# Patient Record
Sex: Female | Born: 1960 | Race: White | Hispanic: No | Smoking: Former smoker
Health system: Southern US, Community
[De-identification: ages and names within clinical notes are randomized; demographics above are authoritative.]

## PROBLEM LIST (undated history)

## (undated) DIAGNOSIS — E119 Type 2 diabetes mellitus without complications: Secondary | ICD-10-CM

## (undated) DIAGNOSIS — I2510 Atherosclerotic heart disease of native coronary artery without angina pectoris: Secondary | ICD-10-CM

## (undated) DIAGNOSIS — R0600 Dyspnea, unspecified: Secondary | ICD-10-CM

## (undated) DIAGNOSIS — J45909 Unspecified asthma, uncomplicated: Secondary | ICD-10-CM

## (undated) DIAGNOSIS — K219 Gastro-esophageal reflux disease without esophagitis: Secondary | ICD-10-CM

## (undated) DIAGNOSIS — K760 Fatty (change of) liver, not elsewhere classified: Secondary | ICD-10-CM

## (undated) DIAGNOSIS — F32A Depression, unspecified: Secondary | ICD-10-CM

## (undated) DIAGNOSIS — M3500 Sicca syndrome, unspecified: Secondary | ICD-10-CM

## (undated) DIAGNOSIS — E785 Hyperlipidemia, unspecified: Secondary | ICD-10-CM

## (undated) DIAGNOSIS — I10 Essential (primary) hypertension: Secondary | ICD-10-CM

## (undated) DIAGNOSIS — E039 Hypothyroidism, unspecified: Secondary | ICD-10-CM

## (undated) DIAGNOSIS — J449 Chronic obstructive pulmonary disease, unspecified: Secondary | ICD-10-CM

## (undated) DIAGNOSIS — E079 Disorder of thyroid, unspecified: Secondary | ICD-10-CM

## (undated) DIAGNOSIS — F329 Major depressive disorder, single episode, unspecified: Secondary | ICD-10-CM

## (undated) DIAGNOSIS — G4730 Sleep apnea, unspecified: Secondary | ICD-10-CM

## (undated) DIAGNOSIS — M797 Fibromyalgia: Secondary | ICD-10-CM

## (undated) HISTORY — PX: CHOLECYSTECTOMY: SHX55

## (undated) HISTORY — PX: HYSTEROSCOPY: SHX211

## (undated) HISTORY — PX: CARPAL TUNNEL RELEASE: SHX101

## (undated) HISTORY — PX: BACK SURGERY: SHX140

## (undated) HISTORY — PX: FOOT SURGERY: SHX648

## (undated) HISTORY — PX: WISDOM TOOTH EXTRACTION: SHX21

## (undated) HISTORY — DX: Sleep apnea, unspecified: G47.30

## (undated) HISTORY — PX: TUBAL LIGATION: SHX77

## (undated) HISTORY — PX: ANKLE SURGERY: SHX546

## (undated) HISTORY — PX: RESECTION DISTAL CLAVICAL: SHX5053

---

## 1997-09-09 ENCOUNTER — Encounter: Admission: RE | Admit: 1997-09-09 | Discharge: 1997-12-08 | Payer: Self-pay | Admitting: *Deleted

## 1997-09-27 ENCOUNTER — Ambulatory Visit (HOSPITAL_COMMUNITY): Admission: RE | Admit: 1997-09-27 | Discharge: 1997-09-27 | Payer: Self-pay | Admitting: Family Medicine

## 1997-12-23 ENCOUNTER — Other Ambulatory Visit: Admission: RE | Admit: 1997-12-23 | Discharge: 1997-12-23 | Payer: Self-pay | Admitting: Family Medicine

## 1997-12-30 ENCOUNTER — Ambulatory Visit (HOSPITAL_COMMUNITY): Admission: RE | Admit: 1997-12-30 | Discharge: 1997-12-30 | Payer: Self-pay | Admitting: Family Medicine

## 1998-02-03 ENCOUNTER — Encounter: Payer: Self-pay | Admitting: Family Medicine

## 1998-02-03 ENCOUNTER — Ambulatory Visit (HOSPITAL_COMMUNITY): Admission: RE | Admit: 1998-02-03 | Discharge: 1998-02-03 | Payer: Self-pay | Admitting: Family Medicine

## 1998-07-12 ENCOUNTER — Emergency Department (HOSPITAL_COMMUNITY): Admission: EM | Admit: 1998-07-12 | Discharge: 1998-07-12 | Payer: Self-pay | Admitting: Emergency Medicine

## 1998-07-12 ENCOUNTER — Encounter: Payer: Self-pay | Admitting: Emergency Medicine

## 1998-10-05 ENCOUNTER — Ambulatory Visit (HOSPITAL_BASED_OUTPATIENT_CLINIC_OR_DEPARTMENT_OTHER): Admission: RE | Admit: 1998-10-05 | Discharge: 1998-10-05 | Payer: Self-pay | Admitting: Orthopedic Surgery

## 1998-11-24 ENCOUNTER — Encounter: Payer: Self-pay | Admitting: Cardiology

## 1998-11-24 ENCOUNTER — Inpatient Hospital Stay (HOSPITAL_COMMUNITY): Admission: EM | Admit: 1998-11-24 | Discharge: 1998-11-25 | Payer: Self-pay | Admitting: Emergency Medicine

## 1998-11-25 ENCOUNTER — Encounter: Payer: Self-pay | Admitting: Cardiology

## 1998-12-28 ENCOUNTER — Ambulatory Visit (HOSPITAL_COMMUNITY): Admission: RE | Admit: 1998-12-28 | Discharge: 1998-12-28 | Payer: Self-pay | Admitting: Family Medicine

## 1998-12-28 ENCOUNTER — Encounter: Payer: Self-pay | Admitting: Family Medicine

## 1999-01-16 ENCOUNTER — Emergency Department (HOSPITAL_COMMUNITY): Admission: EM | Admit: 1999-01-16 | Discharge: 1999-01-16 | Payer: Self-pay | Admitting: Internal Medicine

## 1999-03-14 ENCOUNTER — Emergency Department (HOSPITAL_COMMUNITY): Admission: EM | Admit: 1999-03-14 | Discharge: 1999-03-14 | Payer: Self-pay | Admitting: Emergency Medicine

## 1999-03-17 ENCOUNTER — Ambulatory Visit (HOSPITAL_COMMUNITY): Admission: RE | Admit: 1999-03-17 | Discharge: 1999-03-17 | Payer: Self-pay | Admitting: Gastroenterology

## 1999-07-10 ENCOUNTER — Encounter: Payer: Self-pay | Admitting: Emergency Medicine

## 1999-07-10 ENCOUNTER — Emergency Department (HOSPITAL_COMMUNITY): Admission: EM | Admit: 1999-07-10 | Discharge: 1999-07-10 | Payer: Self-pay | Admitting: Emergency Medicine

## 2000-01-19 ENCOUNTER — Emergency Department (HOSPITAL_COMMUNITY): Admission: EM | Admit: 2000-01-19 | Discharge: 2000-01-19 | Payer: Self-pay | Admitting: Emergency Medicine

## 2000-01-19 ENCOUNTER — Encounter: Payer: Self-pay | Admitting: Emergency Medicine

## 2000-02-09 ENCOUNTER — Encounter: Payer: Self-pay | Admitting: Internal Medicine

## 2000-02-13 ENCOUNTER — Encounter: Payer: Self-pay | Admitting: Internal Medicine

## 2000-02-13 ENCOUNTER — Ambulatory Visit (HOSPITAL_COMMUNITY): Admission: RE | Admit: 2000-02-13 | Discharge: 2000-02-13 | Payer: Self-pay | Admitting: Internal Medicine

## 2000-02-23 ENCOUNTER — Emergency Department (HOSPITAL_COMMUNITY): Admission: EM | Admit: 2000-02-23 | Discharge: 2000-02-24 | Payer: Self-pay | Admitting: Emergency Medicine

## 2000-04-07 ENCOUNTER — Emergency Department (HOSPITAL_COMMUNITY): Admission: EM | Admit: 2000-04-07 | Discharge: 2000-04-07 | Payer: Self-pay | Admitting: Emergency Medicine

## 2000-04-07 ENCOUNTER — Encounter: Payer: Self-pay | Admitting: Family Medicine

## 2000-04-23 ENCOUNTER — Encounter: Payer: Self-pay | Admitting: General Surgery

## 2000-04-29 ENCOUNTER — Encounter (INDEPENDENT_AMBULATORY_CARE_PROVIDER_SITE_OTHER): Payer: Self-pay

## 2000-04-29 ENCOUNTER — Observation Stay (HOSPITAL_COMMUNITY): Admission: RE | Admit: 2000-04-29 | Discharge: 2000-04-30 | Payer: Self-pay | Admitting: General Surgery

## 2000-05-06 ENCOUNTER — Other Ambulatory Visit: Admission: RE | Admit: 2000-05-06 | Discharge: 2000-05-06 | Payer: Self-pay | Admitting: Gynecology

## 2000-05-09 ENCOUNTER — Encounter: Admission: RE | Admit: 2000-05-09 | Discharge: 2000-05-09 | Payer: Self-pay | Admitting: Gynecology

## 2000-05-09 ENCOUNTER — Encounter: Payer: Self-pay | Admitting: Gynecology

## 2000-07-30 ENCOUNTER — Emergency Department (HOSPITAL_COMMUNITY): Admission: EM | Admit: 2000-07-30 | Discharge: 2000-07-30 | Payer: Self-pay | Admitting: *Deleted

## 2000-08-17 ENCOUNTER — Ambulatory Visit (HOSPITAL_COMMUNITY): Admission: RE | Admit: 2000-08-17 | Discharge: 2000-08-17 | Payer: Self-pay | Admitting: Specialist

## 2000-08-17 ENCOUNTER — Encounter: Payer: Self-pay | Admitting: Specialist

## 2000-09-24 ENCOUNTER — Encounter: Payer: Self-pay | Admitting: Emergency Medicine

## 2000-09-24 ENCOUNTER — Emergency Department (HOSPITAL_COMMUNITY): Admission: EM | Admit: 2000-09-24 | Discharge: 2000-09-24 | Payer: Self-pay | Admitting: Emergency Medicine

## 2001-01-27 ENCOUNTER — Emergency Department (HOSPITAL_COMMUNITY): Admission: EM | Admit: 2001-01-27 | Discharge: 2001-01-28 | Payer: Self-pay | Admitting: Emergency Medicine

## 2001-03-28 ENCOUNTER — Emergency Department (HOSPITAL_COMMUNITY): Admission: EM | Admit: 2001-03-28 | Discharge: 2001-03-28 | Payer: Self-pay | Admitting: Emergency Medicine

## 2001-06-17 ENCOUNTER — Encounter: Payer: Self-pay | Admitting: Gynecology

## 2001-06-17 ENCOUNTER — Encounter: Admission: RE | Admit: 2001-06-17 | Discharge: 2001-06-17 | Payer: Self-pay | Admitting: Gynecology

## 2001-06-17 ENCOUNTER — Other Ambulatory Visit: Admission: RE | Admit: 2001-06-17 | Discharge: 2001-06-17 | Payer: Self-pay | Admitting: Gynecology

## 2001-07-05 ENCOUNTER — Emergency Department (HOSPITAL_COMMUNITY): Admission: EM | Admit: 2001-07-05 | Discharge: 2001-07-05 | Payer: Self-pay | Admitting: Emergency Medicine

## 2001-07-05 ENCOUNTER — Encounter: Payer: Self-pay | Admitting: Emergency Medicine

## 2001-09-11 ENCOUNTER — Emergency Department (HOSPITAL_COMMUNITY): Admission: EM | Admit: 2001-09-11 | Discharge: 2001-09-11 | Payer: Self-pay | Admitting: Emergency Medicine

## 2001-09-12 ENCOUNTER — Encounter: Payer: Self-pay | Admitting: Emergency Medicine

## 2001-10-23 ENCOUNTER — Encounter (INDEPENDENT_AMBULATORY_CARE_PROVIDER_SITE_OTHER): Payer: Self-pay | Admitting: Specialist

## 2001-10-23 ENCOUNTER — Ambulatory Visit (HOSPITAL_BASED_OUTPATIENT_CLINIC_OR_DEPARTMENT_OTHER): Admission: RE | Admit: 2001-10-23 | Discharge: 2001-10-23 | Payer: Self-pay | Admitting: Gynecology

## 2002-04-13 ENCOUNTER — Emergency Department (HOSPITAL_COMMUNITY): Admission: EM | Admit: 2002-04-13 | Discharge: 2002-04-13 | Payer: Self-pay | Admitting: Emergency Medicine

## 2002-04-21 ENCOUNTER — Emergency Department (HOSPITAL_COMMUNITY): Admission: EM | Admit: 2002-04-21 | Discharge: 2002-04-21 | Payer: Self-pay | Admitting: Emergency Medicine

## 2002-06-07 ENCOUNTER — Emergency Department (HOSPITAL_COMMUNITY): Admission: EM | Admit: 2002-06-07 | Discharge: 2002-06-07 | Payer: Self-pay | Admitting: Emergency Medicine

## 2002-06-07 ENCOUNTER — Encounter: Payer: Self-pay | Admitting: Emergency Medicine

## 2002-07-19 ENCOUNTER — Emergency Department (HOSPITAL_COMMUNITY): Admission: EM | Admit: 2002-07-19 | Discharge: 2002-07-19 | Payer: Self-pay | Admitting: Emergency Medicine

## 2002-08-13 ENCOUNTER — Emergency Department (HOSPITAL_COMMUNITY): Admission: EM | Admit: 2002-08-13 | Discharge: 2002-08-13 | Payer: Self-pay | Admitting: Emergency Medicine

## 2002-08-13 ENCOUNTER — Encounter: Payer: Self-pay | Admitting: Emergency Medicine

## 2003-02-25 ENCOUNTER — Ambulatory Visit (HOSPITAL_COMMUNITY): Admission: RE | Admit: 2003-02-25 | Discharge: 2003-02-25 | Payer: Self-pay | Admitting: Internal Medicine

## 2003-02-25 ENCOUNTER — Encounter: Payer: Self-pay | Admitting: Internal Medicine

## 2003-03-08 ENCOUNTER — Emergency Department (HOSPITAL_COMMUNITY): Admission: EM | Admit: 2003-03-08 | Discharge: 2003-03-08 | Payer: Self-pay | Admitting: Emergency Medicine

## 2003-08-18 ENCOUNTER — Emergency Department (HOSPITAL_COMMUNITY): Admission: EM | Admit: 2003-08-18 | Discharge: 2003-08-18 | Payer: Self-pay | Admitting: Emergency Medicine

## 2003-08-19 ENCOUNTER — Ambulatory Visit (HOSPITAL_COMMUNITY): Admission: RE | Admit: 2003-08-19 | Discharge: 2003-08-19 | Payer: Self-pay | Admitting: Obstetrics & Gynecology

## 2003-10-06 ENCOUNTER — Ambulatory Visit (HOSPITAL_COMMUNITY): Admission: RE | Admit: 2003-10-06 | Discharge: 2003-10-06 | Payer: Self-pay | Admitting: Obstetrics & Gynecology

## 2003-11-25 ENCOUNTER — Emergency Department (HOSPITAL_COMMUNITY): Admission: EM | Admit: 2003-11-25 | Discharge: 2003-11-25 | Payer: Self-pay | Admitting: Emergency Medicine

## 2004-01-29 ENCOUNTER — Emergency Department (HOSPITAL_COMMUNITY): Admission: EM | Admit: 2004-01-29 | Discharge: 2004-01-29 | Payer: Self-pay | Admitting: Emergency Medicine

## 2004-06-07 ENCOUNTER — Ambulatory Visit (HOSPITAL_COMMUNITY): Admission: RE | Admit: 2004-06-07 | Discharge: 2004-06-07 | Payer: Self-pay | Admitting: Obstetrics & Gynecology

## 2004-06-29 ENCOUNTER — Emergency Department (HOSPITAL_COMMUNITY): Admission: EM | Admit: 2004-06-29 | Discharge: 2004-06-29 | Payer: Self-pay | Admitting: Emergency Medicine

## 2004-08-01 ENCOUNTER — Ambulatory Visit (HOSPITAL_COMMUNITY): Admission: RE | Admit: 2004-08-01 | Discharge: 2004-08-01 | Payer: Self-pay | Admitting: Orthopedic Surgery

## 2004-08-01 ENCOUNTER — Ambulatory Visit (HOSPITAL_BASED_OUTPATIENT_CLINIC_OR_DEPARTMENT_OTHER): Admission: RE | Admit: 2004-08-01 | Discharge: 2004-08-01 | Payer: Self-pay | Admitting: Orthopedic Surgery

## 2004-10-22 ENCOUNTER — Emergency Department (HOSPITAL_COMMUNITY): Admission: EM | Admit: 2004-10-22 | Discharge: 2004-10-22 | Payer: Self-pay | Admitting: Emergency Medicine

## 2004-12-14 ENCOUNTER — Emergency Department (HOSPITAL_COMMUNITY): Admission: EM | Admit: 2004-12-14 | Discharge: 2004-12-14 | Payer: Self-pay | Admitting: Emergency Medicine

## 2005-01-29 ENCOUNTER — Emergency Department (HOSPITAL_COMMUNITY): Admission: EM | Admit: 2005-01-29 | Discharge: 2005-01-30 | Payer: Self-pay | Admitting: Emergency Medicine

## 2005-02-05 ENCOUNTER — Emergency Department (HOSPITAL_COMMUNITY): Admission: EM | Admit: 2005-02-05 | Discharge: 2005-02-05 | Payer: Self-pay | Admitting: Emergency Medicine

## 2005-03-20 ENCOUNTER — Ambulatory Visit (HOSPITAL_COMMUNITY): Admission: RE | Admit: 2005-03-20 | Discharge: 2005-03-20 | Payer: Self-pay | Admitting: Obstetrics & Gynecology

## 2005-06-27 ENCOUNTER — Emergency Department (HOSPITAL_COMMUNITY): Admission: EM | Admit: 2005-06-27 | Discharge: 2005-06-27 | Payer: Self-pay | Admitting: Emergency Medicine

## 2005-07-04 ENCOUNTER — Emergency Department: Payer: Self-pay | Admitting: Emergency Medicine

## 2005-07-20 ENCOUNTER — Emergency Department (HOSPITAL_COMMUNITY): Admission: EM | Admit: 2005-07-20 | Discharge: 2005-07-20 | Payer: Self-pay | Admitting: Emergency Medicine

## 2005-10-22 ENCOUNTER — Emergency Department (HOSPITAL_COMMUNITY): Admission: EM | Admit: 2005-10-22 | Discharge: 2005-10-22 | Payer: Self-pay | Admitting: Emergency Medicine

## 2006-03-26 ENCOUNTER — Ambulatory Visit (HOSPITAL_COMMUNITY): Admission: RE | Admit: 2006-03-26 | Discharge: 2006-03-26 | Payer: Self-pay | Admitting: Obstetrics & Gynecology

## 2006-07-12 ENCOUNTER — Encounter: Payer: Self-pay | Admitting: Internal Medicine

## 2006-08-22 ENCOUNTER — Ambulatory Visit: Payer: Self-pay | Admitting: Pulmonary Disease

## 2006-09-01 ENCOUNTER — Ambulatory Visit (HOSPITAL_BASED_OUTPATIENT_CLINIC_OR_DEPARTMENT_OTHER): Admission: RE | Admit: 2006-09-01 | Discharge: 2006-09-01 | Payer: Self-pay | Admitting: Pulmonary Disease

## 2006-09-01 ENCOUNTER — Ambulatory Visit: Payer: Self-pay | Admitting: Pulmonary Disease

## 2006-09-17 ENCOUNTER — Ambulatory Visit: Payer: Self-pay | Admitting: Pulmonary Disease

## 2006-11-27 ENCOUNTER — Ambulatory Visit: Payer: Self-pay | Admitting: Pulmonary Disease

## 2007-01-03 ENCOUNTER — Ambulatory Visit: Payer: Self-pay | Admitting: Internal Medicine

## 2007-01-03 LAB — CONVERTED CEMR LAB
ALT: 20 units/L (ref 0–35)
Calcium: 8.7 mg/dL (ref 8.4–10.5)
Direct LDL: 153.4 mg/dL
Eosinophils Absolute: 0.2 10*3/uL (ref 0.0–0.6)
Eosinophils Relative: 2.2 % (ref 0.0–5.0)
GFR calc non Af Amer: 114 mL/min
Glucose, Bld: 99 mg/dL (ref 70–99)
Hemoglobin: 12.5 g/dL (ref 12.0–15.0)
Hgb A1c MFr Bld: 6.2 % — ABNORMAL HIGH (ref 4.6–6.0)
Ketones, ur: NEGATIVE mg/dL
Leukocytes, UA: NEGATIVE
Lymphocytes Relative: 32.5 % (ref 12.0–46.0)
MCV: 86.6 fL (ref 78.0–100.0)
Monocytes Relative: 9 % (ref 3.0–11.0)
Neutro Abs: 4.6 10*3/uL (ref 1.4–7.7)
Specific Gravity, Urine: 1.015 (ref 1.000–1.03)
Total Bilirubin: 0.6 mg/dL (ref 0.3–1.2)
Total Protein, Urine: NEGATIVE mg/dL
Total Protein: 6.4 g/dL (ref 6.0–8.3)
Urobilinogen, UA: 0.2 (ref 0.0–1.0)

## 2007-01-05 ENCOUNTER — Encounter: Payer: Self-pay | Admitting: Internal Medicine

## 2007-01-05 DIAGNOSIS — F329 Major depressive disorder, single episode, unspecified: Secondary | ICD-10-CM

## 2007-01-05 DIAGNOSIS — E785 Hyperlipidemia, unspecified: Secondary | ICD-10-CM | POA: Insufficient documentation

## 2007-01-05 DIAGNOSIS — F32A Depression, unspecified: Secondary | ICD-10-CM | POA: Diagnosis present

## 2007-01-05 DIAGNOSIS — E782 Mixed hyperlipidemia: Secondary | ICD-10-CM | POA: Insufficient documentation

## 2007-01-05 DIAGNOSIS — Q742 Other congenital malformations of lower limb(s), including pelvic girdle: Secondary | ICD-10-CM

## 2007-01-05 DIAGNOSIS — I10 Essential (primary) hypertension: Secondary | ICD-10-CM

## 2007-01-05 DIAGNOSIS — F3289 Other specified depressive episodes: Secondary | ICD-10-CM

## 2007-01-05 DIAGNOSIS — K219 Gastro-esophageal reflux disease without esophagitis: Secondary | ICD-10-CM | POA: Insufficient documentation

## 2007-01-05 DIAGNOSIS — J45909 Unspecified asthma, uncomplicated: Secondary | ICD-10-CM | POA: Insufficient documentation

## 2007-01-05 DIAGNOSIS — E039 Hypothyroidism, unspecified: Secondary | ICD-10-CM | POA: Insufficient documentation

## 2007-01-05 DIAGNOSIS — G4733 Obstructive sleep apnea (adult) (pediatric): Secondary | ICD-10-CM

## 2007-01-05 HISTORY — DX: Mixed hyperlipidemia: E78.2

## 2007-01-05 HISTORY — DX: Hyperlipidemia, unspecified: E78.5

## 2007-01-05 HISTORY — DX: Other congenital malformations of lower limb(s), including pelvic girdle: Q74.2

## 2007-01-05 HISTORY — DX: Other specified depressive episodes: F32.89

## 2007-01-05 HISTORY — DX: Major depressive disorder, single episode, unspecified: F32.9

## 2007-01-05 HISTORY — DX: Gastro-esophageal reflux disease without esophagitis: K21.9

## 2007-01-05 HISTORY — DX: Essential (primary) hypertension: I10

## 2007-01-05 HISTORY — DX: Obstructive sleep apnea (adult) (pediatric): G47.33

## 2007-02-03 ENCOUNTER — Ambulatory Visit: Payer: Self-pay | Admitting: Internal Medicine

## 2007-03-12 ENCOUNTER — Ambulatory Visit (HOSPITAL_COMMUNITY): Admission: RE | Admit: 2007-03-12 | Discharge: 2007-03-12 | Payer: Self-pay | Admitting: Obstetrics & Gynecology

## 2007-03-24 ENCOUNTER — Ambulatory Visit: Payer: Self-pay | Admitting: Internal Medicine

## 2007-03-26 ENCOUNTER — Encounter: Payer: Self-pay | Admitting: Internal Medicine

## 2007-03-26 ENCOUNTER — Telehealth: Payer: Self-pay | Admitting: Internal Medicine

## 2007-03-26 ENCOUNTER — Ambulatory Visit: Payer: Self-pay | Admitting: Internal Medicine

## 2007-03-26 DIAGNOSIS — E119 Type 2 diabetes mellitus without complications: Secondary | ICD-10-CM

## 2007-03-26 DIAGNOSIS — J309 Allergic rhinitis, unspecified: Secondary | ICD-10-CM

## 2007-03-26 DIAGNOSIS — G4726 Circadian rhythm sleep disorder, shift work type: Secondary | ICD-10-CM

## 2007-03-26 DIAGNOSIS — K279 Peptic ulcer, site unspecified, unspecified as acute or chronic, without hemorrhage or perforation: Secondary | ICD-10-CM

## 2007-03-26 DIAGNOSIS — J45901 Unspecified asthma with (acute) exacerbation: Secondary | ICD-10-CM | POA: Insufficient documentation

## 2007-03-26 DIAGNOSIS — E088 Diabetes mellitus due to underlying condition with unspecified complications: Secondary | ICD-10-CM

## 2007-03-26 DIAGNOSIS — Z8719 Personal history of other diseases of the digestive system: Secondary | ICD-10-CM

## 2007-03-26 DIAGNOSIS — Z8669 Personal history of other diseases of the nervous system and sense organs: Secondary | ICD-10-CM

## 2007-03-26 HISTORY — DX: Personal history of other diseases of the nervous system and sense organs: Z86.69

## 2007-03-26 HISTORY — DX: Circadian rhythm sleep disorder, shift work type: G47.26

## 2007-03-26 HISTORY — DX: Diabetes mellitus due to underlying condition with unspecified complications: E08.8

## 2007-03-26 HISTORY — DX: Unspecified asthma with (acute) exacerbation: J45.901

## 2007-03-26 HISTORY — DX: Allergic rhinitis, unspecified: J30.9

## 2007-03-26 HISTORY — DX: Peptic ulcer, site unspecified, unspecified as acute or chronic, without hemorrhage or perforation: K27.9

## 2007-03-26 HISTORY — DX: Personal history of other diseases of the digestive system: Z87.19

## 2007-05-01 ENCOUNTER — Observation Stay (HOSPITAL_COMMUNITY): Admission: EM | Admit: 2007-05-01 | Discharge: 2007-05-02 | Payer: Self-pay | Admitting: Family Medicine

## 2007-07-21 ENCOUNTER — Emergency Department (HOSPITAL_COMMUNITY): Admission: EM | Admit: 2007-07-21 | Discharge: 2007-07-21 | Payer: Self-pay | Admitting: Emergency Medicine

## 2007-08-04 ENCOUNTER — Other Ambulatory Visit: Admission: RE | Admit: 2007-08-04 | Discharge: 2007-08-04 | Payer: Self-pay | Admitting: Obstetrics & Gynecology

## 2007-09-08 ENCOUNTER — Emergency Department (HOSPITAL_COMMUNITY): Admission: EM | Admit: 2007-09-08 | Discharge: 2007-09-08 | Payer: Self-pay | Admitting: Emergency Medicine

## 2007-11-22 ENCOUNTER — Emergency Department (HOSPITAL_COMMUNITY): Admission: EM | Admit: 2007-11-22 | Discharge: 2007-11-22 | Payer: Self-pay | Admitting: Emergency Medicine

## 2008-03-04 ENCOUNTER — Ambulatory Visit: Payer: Self-pay | Admitting: Internal Medicine

## 2008-03-04 DIAGNOSIS — B370 Candidal stomatitis: Secondary | ICD-10-CM | POA: Insufficient documentation

## 2008-03-04 LAB — CONVERTED CEMR LAB
Alkaline Phosphatase: 99 units/L (ref 39–117)
BUN: 8 mg/dL (ref 6–23)
CO2: 29 meq/L (ref 19–32)
Chloride: 102 meq/L (ref 96–112)
Cholesterol: 211 mg/dL (ref 0–200)
Creatinine, Ser: 0.7 mg/dL (ref 0.4–1.2)
Direct LDL: 178.1 mg/dL
Eosinophils Relative: 1.4 % (ref 0.0–5.0)
GFR calc non Af Amer: 95 mL/min
HCT: 41.2 % (ref 36.0–46.0)
Hgb A1c MFr Bld: 6.2 % — ABNORMAL HIGH (ref 4.6–6.0)
Lymphocytes Relative: 24.3 % (ref 12.0–46.0)
Monocytes Relative: 2.5 % — ABNORMAL LOW (ref 3.0–12.0)
Neutrophils Relative %: 71.1 % (ref 43.0–77.0)
Nitrite: NEGATIVE
Platelets: 343 10*3/uL (ref 150–400)
Potassium: 3.4 meq/L — ABNORMAL LOW (ref 3.5–5.1)
Total Bilirubin: 1.2 mg/dL (ref 0.3–1.2)
Total Protein, Urine: NEGATIVE mg/dL
VLDL: 19 mg/dL (ref 0–40)
WBC: 10 10*3/uL (ref 4.5–10.5)
pH: 7.5 (ref 5.0–8.0)

## 2008-03-06 LAB — CONVERTED CEMR LAB: Vit D, 1,25-Dihydroxy: 36 (ref 30–89)

## 2008-03-12 ENCOUNTER — Ambulatory Visit (HOSPITAL_COMMUNITY): Admission: RE | Admit: 2008-03-12 | Discharge: 2008-03-12 | Payer: Self-pay | Admitting: Obstetrics & Gynecology

## 2008-03-17 ENCOUNTER — Telehealth (INDEPENDENT_AMBULATORY_CARE_PROVIDER_SITE_OTHER): Payer: Self-pay | Admitting: *Deleted

## 2008-03-17 DIAGNOSIS — R197 Diarrhea, unspecified: Secondary | ICD-10-CM | POA: Insufficient documentation

## 2008-03-18 ENCOUNTER — Telehealth (INDEPENDENT_AMBULATORY_CARE_PROVIDER_SITE_OTHER): Payer: Self-pay | Admitting: *Deleted

## 2008-04-01 ENCOUNTER — Encounter: Payer: Self-pay | Admitting: Internal Medicine

## 2008-04-02 ENCOUNTER — Encounter: Payer: Self-pay | Admitting: Internal Medicine

## 2008-04-22 DIAGNOSIS — J42 Unspecified chronic bronchitis: Secondary | ICD-10-CM

## 2008-04-22 DIAGNOSIS — Z862 Personal history of diseases of the blood and blood-forming organs and certain disorders involving the immune mechanism: Secondary | ICD-10-CM

## 2008-04-22 DIAGNOSIS — Z8639 Personal history of other endocrine, nutritional and metabolic disease: Secondary | ICD-10-CM

## 2008-04-22 DIAGNOSIS — G43909 Migraine, unspecified, not intractable, without status migrainosus: Secondary | ICD-10-CM | POA: Insufficient documentation

## 2008-04-22 HISTORY — DX: Migraine, unspecified, not intractable, without status migrainosus: G43.909

## 2008-04-22 HISTORY — DX: Personal history of diseases of the blood and blood-forming organs and certain disorders involving the immune mechanism: Z86.2

## 2008-04-22 HISTORY — DX: Personal history of diseases of the blood and blood-forming organs and certain disorders involving the immune mechanism: Z86.39

## 2008-04-22 HISTORY — DX: Unspecified chronic bronchitis: J42

## 2008-04-27 ENCOUNTER — Ambulatory Visit: Payer: Self-pay | Admitting: Internal Medicine

## 2008-04-27 DIAGNOSIS — R1319 Other dysphagia: Secondary | ICD-10-CM

## 2008-04-27 DIAGNOSIS — J209 Acute bronchitis, unspecified: Secondary | ICD-10-CM | POA: Insufficient documentation

## 2008-04-27 HISTORY — DX: Other dysphagia: R13.19

## 2008-04-27 HISTORY — DX: Acute bronchitis, unspecified: J20.9

## 2008-04-27 LAB — CONVERTED CEMR LAB: Tissue Transglutaminase Ab, IgA: 0.4 units (ref <7)

## 2008-05-10 ENCOUNTER — Encounter: Payer: Self-pay | Admitting: Internal Medicine

## 2008-05-10 ENCOUNTER — Ambulatory Visit: Payer: Self-pay | Admitting: Internal Medicine

## 2008-05-12 ENCOUNTER — Encounter: Payer: Self-pay | Admitting: Internal Medicine

## 2008-06-11 HISTORY — PX: ANKLE SURGERY: SHX546

## 2008-07-06 ENCOUNTER — Ambulatory Visit: Payer: Self-pay | Admitting: Internal Medicine

## 2008-08-06 ENCOUNTER — Other Ambulatory Visit: Admission: RE | Admit: 2008-08-06 | Discharge: 2008-08-06 | Payer: Self-pay | Admitting: Obstetrics & Gynecology

## 2008-08-13 ENCOUNTER — Inpatient Hospital Stay (HOSPITAL_COMMUNITY): Admission: EM | Admit: 2008-08-13 | Discharge: 2008-08-16 | Payer: Self-pay | Admitting: Emergency Medicine

## 2008-08-31 ENCOUNTER — Ambulatory Visit: Payer: Self-pay | Admitting: Internal Medicine

## 2008-09-01 LAB — CONVERTED CEMR LAB
Chloride: 105 meq/L (ref 96–112)
Cholesterol: 157 mg/dL (ref 0–200)
Creatinine, Ser: 1.1 mg/dL (ref 0.4–1.2)
HDL: 35.1 mg/dL — ABNORMAL LOW (ref >39.00)
LDL Cholesterol: 96 mg/dL (ref 0–99)
Potassium: 3.4 meq/L — ABNORMAL LOW (ref 3.5–5.1)
Sodium: 143 meq/L (ref 135–145)
Triglycerides: 131 mg/dL (ref 0.0–149.0)

## 2008-12-17 ENCOUNTER — Emergency Department (HOSPITAL_COMMUNITY): Admission: EM | Admit: 2008-12-17 | Discharge: 2008-12-17 | Payer: Self-pay | Admitting: Emergency Medicine

## 2009-02-09 ENCOUNTER — Telehealth: Payer: Self-pay | Admitting: Internal Medicine

## 2009-02-24 ENCOUNTER — Ambulatory Visit: Payer: Self-pay | Admitting: Internal Medicine

## 2009-02-24 DIAGNOSIS — R519 Headache, unspecified: Secondary | ICD-10-CM | POA: Insufficient documentation

## 2009-02-24 DIAGNOSIS — R51 Headache: Secondary | ICD-10-CM

## 2009-02-24 DIAGNOSIS — J019 Acute sinusitis, unspecified: Secondary | ICD-10-CM | POA: Insufficient documentation

## 2009-02-24 HISTORY — DX: Headache: R51

## 2009-02-24 HISTORY — DX: Headache, unspecified: R51.9

## 2009-02-24 HISTORY — DX: Acute sinusitis, unspecified: J01.90

## 2009-03-14 ENCOUNTER — Ambulatory Visit: Payer: Self-pay | Admitting: Internal Medicine

## 2009-03-14 LAB — CONVERTED CEMR LAB
BUN: 18 mg/dL (ref 6–23)
Basophils Absolute: 0.3 10*3/uL — ABNORMAL HIGH (ref 0.0–0.1)
Cholesterol: 220 mg/dL — ABNORMAL HIGH (ref 0–200)
Direct LDL: 179.5 mg/dL
GFR calc non Af Amer: 94.12 mL/min (ref >60)
Glucose, Bld: 82 mg/dL (ref 70–99)
HCT: 38.2 % (ref 36.0–46.0)
HDL: 38.5 mg/dL — ABNORMAL LOW (ref >39.00)
Lymphs Abs: 2.6 10*3/uL (ref 0.7–4.0)
MCV: 87.6 fL (ref 78.0–100.0)
Microalb, Ur: 0.7 mg/dL (ref 0.0–1.9)
Monocytes Absolute: 0.6 10*3/uL (ref 0.1–1.0)
Monocytes Relative: 6.1 % (ref 3.0–12.0)
Platelets: 283 10*3/uL (ref 150.0–400.0)
Potassium: 3.5 meq/L (ref 3.5–5.1)
RDW: 13.7 % (ref 11.5–14.6)
Total Bilirubin: 0.7 mg/dL (ref 0.3–1.2)
Total CHOL/HDL Ratio: 6
Triglycerides: 128 mg/dL (ref 0.0–149.0)
Urine Glucose: NEGATIVE mg/dL
Urobilinogen, UA: 0.2 (ref 0.0–1.0)
VLDL: 25.6 mg/dL (ref 0.0–40.0)

## 2009-03-15 LAB — CONVERTED CEMR LAB: Vit D, 25-Hydroxy: 26 ng/mL — ABNORMAL LOW (ref 30–89)

## 2009-03-21 ENCOUNTER — Telehealth: Payer: Self-pay | Admitting: Internal Medicine

## 2009-03-21 ENCOUNTER — Ambulatory Visit (HOSPITAL_COMMUNITY): Admission: RE | Admit: 2009-03-21 | Discharge: 2009-03-21 | Payer: Self-pay | Admitting: Obstetrics & Gynecology

## 2009-05-23 ENCOUNTER — Ambulatory Visit: Payer: Self-pay | Admitting: Internal Medicine

## 2009-05-23 DIAGNOSIS — J029 Acute pharyngitis, unspecified: Secondary | ICD-10-CM | POA: Insufficient documentation

## 2009-05-23 HISTORY — DX: Acute pharyngitis, unspecified: J02.9

## 2010-05-29 ENCOUNTER — Emergency Department (HOSPITAL_COMMUNITY)
Admission: EM | Admit: 2010-05-29 | Discharge: 2010-05-29 | Payer: Self-pay | Source: Home / Self Care | Admitting: Emergency Medicine

## 2010-06-24 ENCOUNTER — Emergency Department (HOSPITAL_COMMUNITY)
Admission: EM | Admit: 2010-06-24 | Discharge: 2010-06-25 | Payer: Self-pay | Source: Home / Self Care | Admitting: Emergency Medicine

## 2010-08-20 ENCOUNTER — Emergency Department (HOSPITAL_COMMUNITY)
Admission: EM | Admit: 2010-08-20 | Discharge: 2010-08-20 | Disposition: A | Discharge status: Home / Self Care | Payer: Self-pay | Attending: Emergency Medicine | Admitting: Emergency Medicine

## 2010-08-20 DIAGNOSIS — E039 Hypothyroidism, unspecified: Secondary | ICD-10-CM | POA: Insufficient documentation

## 2010-08-20 DIAGNOSIS — J45909 Unspecified asthma, uncomplicated: Secondary | ICD-10-CM | POA: Insufficient documentation

## 2010-08-20 DIAGNOSIS — Z79899 Other long term (current) drug therapy: Secondary | ICD-10-CM | POA: Insufficient documentation

## 2010-08-20 DIAGNOSIS — K219 Gastro-esophageal reflux disease without esophagitis: Secondary | ICD-10-CM | POA: Insufficient documentation

## 2010-08-20 DIAGNOSIS — Z9889 Other specified postprocedural states: Secondary | ICD-10-CM | POA: Insufficient documentation

## 2010-08-20 DIAGNOSIS — M25569 Pain in unspecified knee: Secondary | ICD-10-CM | POA: Insufficient documentation

## 2010-08-20 DIAGNOSIS — F3289 Other specified depressive episodes: Secondary | ICD-10-CM | POA: Insufficient documentation

## 2010-08-20 DIAGNOSIS — I10 Essential (primary) hypertension: Secondary | ICD-10-CM | POA: Insufficient documentation

## 2010-08-20 DIAGNOSIS — F329 Major depressive disorder, single episode, unspecified: Secondary | ICD-10-CM | POA: Insufficient documentation

## 2010-09-18 LAB — URINE CULTURE

## 2010-09-18 LAB — URINALYSIS, ROUTINE W REFLEX MICROSCOPIC
Bilirubin Urine: NEGATIVE
Nitrite: NEGATIVE
Specific Gravity, Urine: 1.011 (ref 1.005–1.030)
Urobilinogen, UA: 1 mg/dL (ref 0.0–1.0)

## 2010-09-18 LAB — DIFFERENTIAL
Basophils Absolute: 0 10*3/uL (ref 0.0–0.1)
Basophils Relative: 0 % (ref 0–1)
Eosinophils Absolute: 0.1 10*3/uL (ref 0.0–0.7)
Eosinophils Relative: 2 % (ref 0–5)
Monocytes Absolute: 0.5 10*3/uL (ref 0.1–1.0)

## 2010-09-18 LAB — COMPREHENSIVE METABOLIC PANEL
AST: 24 U/L (ref 0–37)
Albumin: 3.5 g/dL (ref 3.5–5.2)
Alkaline Phosphatase: 94 U/L (ref 39–117)
BUN: 14 mg/dL (ref 6–23)
Chloride: 106 mEq/L (ref 96–112)
GFR calc Af Amer: >60 mL/min (ref >60)
Potassium: 3.4 mEq/L — ABNORMAL LOW (ref 3.5–5.1)
Total Bilirubin: 0.7 mg/dL (ref 0.3–1.2)

## 2010-09-18 LAB — CBC
HCT: 41.3 % (ref 36.0–46.0)
Platelets: 257 10*3/uL (ref 150–400)
WBC: 7 10*3/uL (ref 4.0–10.5)

## 2010-09-21 LAB — PROTIME-INR: INR: 1.2 (ref 0.00–1.49)

## 2010-09-21 LAB — DIFFERENTIAL
Basophils Absolute: 0 10*3/uL (ref 0.0–0.1)
Basophils Relative: 0 % (ref 0–1)
Monocytes Absolute: 0.4 10*3/uL (ref 0.1–1.0)
Neutro Abs: 12.2 10*3/uL — ABNORMAL HIGH (ref 1.7–7.7)
Neutrophils Relative %: 82 % — ABNORMAL HIGH (ref 43–77)

## 2010-09-21 LAB — POCT CARDIAC MARKERS: Troponin i, poc: <0.05 ng/mL (ref 0.00–0.09)

## 2010-09-21 LAB — BASIC METABOLIC PANEL
CO2: 26 mEq/L (ref 19–32)
Calcium: 8.6 mg/dL (ref 8.4–10.5)
Creatinine, Ser: 0.69 mg/dL (ref 0.4–1.2)
Glucose, Bld: 131 mg/dL — ABNORMAL HIGH (ref 70–99)

## 2010-09-21 LAB — APTT: aPTT: 32 seconds (ref 24–37)

## 2010-09-21 LAB — CBC
MCHC: 33.9 g/dL (ref 30.0–36.0)
RDW: 16 % — ABNORMAL HIGH (ref 11.5–15.5)

## 2010-10-24 NOTE — Assessment & Plan Note (Signed)
[facility]                             PULMONARY OFFICE NOTE   NAME:Chen, Marie SIDMAN                   MRN:          [id number]  DATE:11/27/2006                            DOB:          [birth year]    I met [name] today for followup of her sleep apnea.   She is currently on CPAP at 8, using nasal pillows and heated  humidification.   She is doing quite well with this.  She continues to work third shift.  She goes to bed at around 6:30 in the morning and sleeps through until  about 12 or 1:00 in the afternoon.  She says that her sleep is much more  restful, and she feels more energetic during the day.  She will still  take a nap for about an hour or two in the evening-time before going to  work.  She says that sometimes she will wear her CPAP mask but that  sometimes she does not.   She also complains of soreness in the middle part of her back as well as  having cough with white-to-yellowish phlegm recently.  She denies having  any fevers, chills, or sweats.  She denies having any chest pain.  She  denies having any symptoms of wheezing.   Her medication list was reviewed.   PHYSICAL EXAMINATION:  VITAL SIGNS:  She is 315 pounds.  Temperature  98.2.  Blood pressure 118/82.  Heart rate is 76.  Oxygen saturation is  96% on room air.  HEENT:  There is no sinus tenderness.  No oral lesions.  NECK:  No lymphadenopathy.  HEART:  S1 and S2.  CHEST:  Clear to auscultation.  There is mild tenderness with palpation  over her mid scapular region along the line where her bra strap would  fall.  ABDOMEN:  Soft, nontender.  EXTREMITIES:  No edema.   IMPRESSION:  1. Obstructive sleep apnea.  She is currently doing quite well with      the use of her CPAP machine.  I have encouraged her to maintain her      compliance with this.  I have also encouraged her to use her CPAP      machine for the entire time that she is asleep, including during  her naps.  2. Shift work syndrome:  Again, I have encouraged her to maintain a      normal sleep-wake schedule.  3. Tobacco abuse:  She says that she is down to one cigarette a day      and is continuing to work on smoking cessation.  4. Back pain:  I believe this is musculoskeletal.  I would advise her      that if it persists, she can call for further assistance.  5. Cough:  It is likely that she has a viral respiratory infection.  I      advised her to undergo symptomatic management for this.  If this is      persistent, however, then further interventions may be necessary.      I would advise her to call me if  these symptoms are persistent.   I will follow up with her in three months.     Coralyn Helling, MD  Electronically Signed    VS/MedQ  DD: 11/27/2006  DT: 11/28/2006  Job #: [id number]   cc:   Ernestina Penna, M.D.

## 2010-10-24 NOTE — H&P (Signed)
NAMEFLOYE, FESLER            ACCOUNT NO.:  [id number]   MEDICAL RECORD NO.:  [id number]          PATIENT TYPE:  OBV   LOCATION:  5704                         FACILITY:  [facility]   PHYSICIAN:  Lonia Blood, M.D.       DATE OF BIRTH:  [birth year]   DATE OF ADMISSION:  05/01/2007  DATE OF DISCHARGE:  05/02/2007                              HISTORY & PHYSICAL   PRIMARY CARE PHYSICIAN:  This patient does not have a primary care  physician.   CHIEF COMPLAINT:  Cough, fever and chills.   HISTORY OF PRESENT ILLNESS:  Ms. Bankson is a 50 year old woman with a  past medical history of asthma, tobacco abuse and obstructive sleep  apnea, who presented to the emergency room after about 48 hours of  productive coughing with greenish sputum, fever chills and shortness of  breath.   PAST MEDICAL HISTORY:  1. Asthma.  2. Obstructive sleep apnea.  3. Obesity.  4. Impaired glucose tolerance.  5. Hypertension.  6. Hyperlipidemia.  7. Gastroesophageal reflux disease.  8. Hypothyroidism.  9. Depression.  10.Bilateral tubal ligation.  11.Cholecystectomy.   HOME MEDICATIONS:  Estrogen tablets, Maxzide, levothyroxine, Prozac,  Norvasc.   ALLERGIES:  1. DILAUDID.  2. MACRODANTIN.  3. TETRACYCLINE.   SOCIAL HISTORY:  The patient works for [employer].  She has 2 children that  are grown up.  She is married and lives with her husband.  She smokes  about 3 cigarettes a day.  She denies any alcohol use.   FAMILY HISTORY:  Positive for coronary artery disease in father.  Positive for diabetes in her father.  Positive for sleep apnea in  brother.   REVIEW OF SYSTEMS:  As per HPI.  All other systems are reviewed and are  negative.   PHYSICAL EXAMINATION:  VITAL SIGNS:  Upon admission, shows a temperature  of 102.0, blood pressure 166/94, pulse 90, respirations 20, saturation  94% on room air.  GENERAL APPEARANCE:  An obese woman in some moderate distress, sitting  on the stretcher.  She is alert,  oriented to place, person and time.  HEENT:  Head appears normocephalic, atraumatic.  Eyes:  Pupils equal,  round and reactive to light and accommodation.  Extraocular movement is  intact.  Throat is clear.  NECK:  Supple.  No JVD.  No carotid bruits.  CHEST:  Bilateral wheezes and rhonchi.  No crackles.  HEART:  Regular rate and rhythm without murmurs, rubs or gallops.  ABDOMEN:  Obese and soft.  Nontender.  Bowel sounds are present.  No  palpable hepatosplenomegaly.  LOWER EXTREMITIES:  Three plus bilateral edema and bilateral erythema,  left greater than right.   LABORATORY DATA:  At the time of admission, sodium 137, potassium 4.0,  BUN 11, creatinine 0.7, glucose 111, white blood cell count 6,  hemoglobin 13, platelet count 257.  Portable chest x-ray shows bilateral  infiltrates in the lower lobes.   ASSESSMENT/PLAN:  Bilateral pneumonia in a patient with asthma,  obstructive sleep apnea and tobacco abuse.  Ms. Boutwell will be  admitted for 23 hour observation.  She will be placed  on nebulizers,  intravenous antibiotics and steroids.  For the treatment, it will be  depended on how she does over night.  For now, we will continue the CPAP  without changes.  I do suspect the patient has a community acquired  pneumonia with usual pathogens, so we will use a fluoroquinolone.  The  patient will be continued on proton pump inhibitor for depression and  thyroid medications.      Lonia Blood, M.D.  Electronically Signed     SL/MEDQ  D:  05/01/2007  T:  05/02/2007  Job:  [id number]

## 2010-10-24 NOTE — Discharge Summary (Signed)
Marie Chen, Marie Chen            ACCOUNT NO.:  [id number]   MEDICAL RECORD NO.:  [id number]          PATIENT TYPE:  INP   LOCATION:  5028                         FACILITY:  [facility]   PHYSICIAN:  Lubertha Basque. Dalldorf, M.D.DATE OF BIRTH:  06-20-1960   DATE OF ADMISSION:  08/13/2008  DATE OF DISCHARGE:  08/16/2008                               DISCHARGE SUMMARY   ADMITTING DIAGNOSES:  1. Left ankle fracture dislocation.  2. History of asthma.  3. History of increased cholesterol.  4. Hypertension.  5. Osteoporosis.  6. Depression.   DISCHARGE DIAGNOSES:  1. Left ankle fracture dislocation.  2. History of asthma.  3. History of increased cholesterol.  4. Hypertension.  5. Osteoporosis.  6. Depression.   OPERATION:  Left ankle open reduction and internal fixation.   BRIEF HISTORY:  Marie Chen is a 50 year old white female who slipped  in the mud the day of admission to the hospital and suffered a fracture  dislocation of her left ankle.  She was transported to the emergency  room at [facility], at which time x-rays confirmed that diagnosis and  that is where we met her and discussed the treatment options of reducing  her ankle and then fixing it with an open reduction and internal  fixation.   PERTINENT LABORATORY DATA AND X-RAY FINDINGS:  Hemoglobin 13.9,  hematocrit 41.0, and WBCs 15.  Sodium 136, potassium 3.7, glucose 131,  creatinine 0.69, and BUN 12.   HOSPITAL COURSE:  She was admitted postoperatively and placed on a  variety of p.r.n. IM analgesics for pain including a PCA pump.  She was  kept on her home medicines which will be outlined at the end of this  dictation.  Also given appropriate p.o. pain medicines, antiemetics,  antispasmodics, ice, and elevation to her left leg.  Physical therapy  was ordered for no weightbearing gait and staying in her postoperative  dressing.  The first day postop, her vital signs were blood pressure  110/65, heart rate 70, and  temperature 97.  Her lungs were clear.  Abdomen was soft.  Eating and voiding.  No problems.  Normal  neurovascular status.  The second day postop, she had a slip and fall in  the bathroom.  We had done x-rays post fall which showed no change in  her fixation status.  The ankle still looked good status post ORIF.  The  third day, she had blood pressure of 105/65, temperature 97.9, and heart  rate 76.  Normal neurovascular status to her toes.  Lungs were clear.  Abdomen was soft.  She was eating and voiding and was discharged home.   CONDITION ON DISCHARGE:  Improved.   DISCHARGE INSTRUCTIONS:  She will remain on a low-sodium heart-healthy  diet.  She will have home therapy arranged for non-weightbearing gait  with walker, leave the splint on, no dressing changes, ice and  elevation.  Prescription for Percocet was given to her 1-2 every 4-6  hours for pain.  She will remain on  1. Advair 250/50 one puff b.i.d.  2. Aspirin 81 mg once a day.  3. Crestor 20  mg once a day.  4. Estrace 2 mg once a day.  5. Maxzide 75/50 one tablet daily.  6. Norvasc 10 mg one a day.  7. Os-Cal one tablet once a day.  8. Provera 2.5 mg one tablet daily.  9. Prozac 40 mg one tablet daily.  10.Ambien 10 mg as needed for sleep.   FOLLOWUP:  She will return to our office in 2 weeks for dressing  removal, stitch removal, and repeat x-ray.  Any sign of infection, she  is to call our office at [phone] and we would see her immediately.      Lindwood Qua, P.A.      Lubertha Basque Jerl Santos, M.D.  Electronically Signed    MC/MEDQ  D:  08/16/2008  T:  08/17/2008  Job:  [id number]

## 2010-10-24 NOTE — Op Note (Signed)
Marie Chen, Marie Chen            ACCOUNT NO.:  [id number]   MEDICAL RECORD NO.:  [id number]          PATIENT TYPE:  OBV   LOCATION:  5028                         FACILITY:  [facility]   PHYSICIAN:  Lubertha Basque. Dalldorf, M.D.DATE OF BIRTH:  1961/06/07   DATE OF PROCEDURE:  08/13/2008  DATE OF DISCHARGE:                               OPERATIVE REPORT   PREOPERATIVE DIAGNOSIS:  Left ankle bimalleolar fracture.   POSTOPERATIVE DIAGNOSIS:  Left ankle bimalleolar fracture.   PROCEDURE:  Left ankle open reduction and internal fixation.   ANESTHESIA:  General.   ATTENDING SURGEON:  Lubertha Basque. Jerl Santos, MD   ASSISTANT:  Lindwood Qua, PA   INDICATION FOR PROCEDURE:  The patient is a 50 year old woman who  slipped on some mud today and sustained a fracture and dislocation of  her left ankle.  With terrible pain and inability to stand, she was  taken to the [facility] Emergency Room.  X-rays revealed a displaced  bimalleolar fracture/dislocation and she is offered ORIF in hopes of  realigning her joint and allowing her to stand and potentially walk.  Informed operative consent was obtained after discussion of possible  complications including reaction to anesthesia, infection, neurovascular  injury, failure of bone healing, and arthritis.   SUMMARY/FINDINGS/PROCEDURE:  Under general anesthesia, an ORIF was  performed of the left ankle.  We reduced things anatomically on the  lateral aspect with a one-third tubular side plate and then medial, we  placed 2 malleolar screws.  I used fluoroscopy throughout the case to  make appropriate intraoperative decisions and read all these views  myself.  Lindwood Qua assisted throughout and was invaluable to the  completion of the case in that he helped maintain reduction while I  performed fixation.  He also closed simultaneously to help minimize OR  time.   DESCRIPTION OF PROCEDURE:  The patient was brought to the operating  suite where general  anesthetic was applied without difficulty.  She was  positioned supine with a bump under left hip and prepped and draped in  normal sterile fashion.  After administration of IV Kefzol, the left leg  was elevated, exsanguinated, and tourniquet inflated about the calf.  A  lateral incision was made with dissection down the fibular fracture.  This was reduced anatomically and then stabilized with a 6.0 hole one-  third tubular side plate by [employer], which was contoured to the bone.  We achieved good bicortical fixation with 5 screws and placed a distal  cancellus screw, which fit as well.  We then made a small incision  medial and dissected down to the displaced medial malleolus fracture.  This was aligned anatomically and stabilized with 2 small fragments  partially throughout the cancellus screws.  Fluoroscopy was utilized to  confirm adequate reduction of fracture and placement of hardware.  The  wounds were irrigated followed by reapproximation of subcutaneous  tissues with 0 and 2-0 undyed Vicryl and skin closure with staples.  The  tourniquet was deflated and foot became pink and warm immediately.  Some  Marcaine was injected about the incisions followed by Adaptic and a dry  gauze dressing with a posterior splint of plaster with the ankle in  neutral position.  Estimated blood loss and fluid obtained from  anesthesia records and also pneumatic tourniquet time.   DISPOSITION:  The patient was explained in the operating room and taken  to recovery in stable addition.  She is to be admitted to the orthopedic  surgery service for appropriate postop care to include perioperative  antibiotics and immediate mobilization to help prevent DVT.      Lubertha Basque Jerl Santos, M.D.  Electronically Signed     PGD/MEDQ  D:  08/13/2008  T:  08/15/2008  Job:  [id number]

## 2010-10-24 NOTE — Discharge Summary (Signed)
NAMENELWYN, HEBDON            ACCOUNT NO.:  [id number]   MEDICAL RECORD NO.:  [id number]          PATIENT TYPE:  OBV   LOCATION:  5704                         FACILITY:  [facility]   PHYSICIAN:  Beckey Rutter, MD  DATE OF BIRTH:  1960/12/05   DATE OF ADMISSION:  05/01/2007  DATE OF DISCHARGE:  05/02/2007                               DISCHARGE SUMMARY   PRIMARY CARE PHYSICIAN:  The patient is unassigned to [facility]. She  followed up with [facility] for some time.   HISTORY OF PRESENT ILLNESS:  This is a 50 year old obese female who  presented with shortness of breath.  The patient was found to have  evidence of bilateral basilar infiltrate and clinically suspicious for  bronchial asthma.   HOSPITAL COURSE:  1. For her bilateral/bibasilar early pneumonia, the patient received      Avelox IV.  2. Asthma/obstructive sleep apnea.  The patient was receiving      nebulizer medication, a steroid with improvement of her symptoms      today.  3. Obstructive sleep apnea.  The patient did not need to use a CPAP a      at night, but the patient was recommended to continue using CPAP as      recommended by pulmonary on previous visit.  4. Obesity.  The patient was counseled to reduce her weight.  5. Tobacco abuse.  The patient was counseled to quit smoking.   DISCHARGE DIAGNOSES:  1. Bibasilar pneumonia.  2. Bronchial asthma .  3. Obstructive sleep apnea.  4. Obesity.  5. Tobacco abuse.   SECONDARY DIAGNOSES:  1. Hypertension.  2. Gastroesophageal reflux disease.  3. Hypothyroidism.  4. Depression.  5. Status post bilateral tubal ligation.  6. Status post laparoscopic cholecystectomy.   DISCHARGE MEDICATIONS:  1. The patient will be continued on medication as before admission.  I      will add today tapering steroid dose in the form of prednisone.  2. Albuterol inhaler.  3. Atrovent nebulizer.  4. Avelox for 5 more days.   DISCHARGE PLAN:  The patient's condition  improved now, though she still  has minimal wheezes.  The patient was advised to return immediately to  the emergency room if she felt shortness of breath or if she could not  fill her medication.      Beckey Rutter, MD  Electronically Signed     EME/MEDQ  D:  05/02/2007  T:  05/02/2007  Job:  [id number]

## 2010-10-27 NOTE — Procedures (Signed)
Marie Chen, Marie Chen            ACCOUNT NO.:  [id number]   MEDICAL RECORD NO.:  [id number]          PATIENT TYPE:  OUT   LOCATION:  SLEEP CENTER                 FACILITY:  [facility]   PHYSICIAN:  Coralyn Helling, MD        DATE OF BIRTH:  09-Mar-1961   DATE OF STUDY:                            NOCTURNAL POLYSOMNOGRAM   INDICATION FOR STUDY:  This is an individual who has a history of  hypertension and hypothyroidism.  She also has symptoms of sleep  disruption with excessive daytime sleepiness.  She was referred to the  sleep lab for evaluation of hypersomnia with obstructive sleep apnea.   EPWORTH SLEEPINESS SCORE:  16.   MEDICATIONS:  Maxzide, Prozac, levothyroxine, amlodipine, Prempro, and  over-the-counter proton pump inhibitor.  She did not take any  medications on night of study.   SLEEP ARCHITECTURE:  Total recording time was 409 minutes.  Total sleep  time was 329 minutes.  Sleep efficiency was 84%.  Sleep latency was 27.5  minutes, which is prolonged.  REM latency is 106 minutes, which is  normal.  The patient was observed in all stages of sleep; however, there  was a relative reduction in the percentage of slow-wave sleep to 8% of  the study.  The patient was observed in both the supine and non-supine  position.  She had an alpha-delta pattern noted during slow-wave sleep.   RESPIRATORY DATA:  The average respiratory rate was 16.  The apnea-  hypopnea index was 20.6.  The events were exclusively obstructive in  nature.  The non-REM apnea-hypopnea index was 10.8.  The REM apnea-  hypopnea index was 54.3.  The non-supine apnea-hypopnea index was 11.4.  The supine apnea-hypopnea index was 34.9.  Moderate snoring was noted by  the technician.  The patient was scheduled for a split-night study  protocol; however, due to insufficient events in the first half of the  study, she did not meet protocol criteria.   OXYGEN DATA:  The baseline oxygenation was 98%.  The oxygen  saturation  nadir was 77%.  The patient spent a total of 358 minutes with an oxygen  saturation between 91 to 100%, 26 minutes with an oxygen saturation  between 81 to 90%, and 1 minute with an oxygen saturation between 71 to  80%.   CARDIAC DATA:  The average heart rate was 69.  The rhythm strip showed  normal sinus rhythm with PVCs.   MOVEMENT-PARASOMNIA:  The periodic limb movement index was 43.  The  patient had 1 bathroom trip.   IMPRESSIONS-RECOMMENDATIONS:  This study showed evidence for moderate to  severe obstructive sleep apnea, as demonstrated by an apnea-hypopnea  index of 20.6 and oxygen saturation nadir of 77%.  She did have a  significant positional as well as REM effect.  She also had an elevation  in her periodic limb movement index.  Given  the severity of the patient's sleep apnea and oxygen desaturations,  strong consideration should be given to having her undergo CPAP therapy.      Coralyn Helling, MD  Diplomat, American Board of Sleep Medicine  Electronically Signed     VS/MEDQ  D:  09/04/2006 10:03:50  T:  09/04/2006 12:29:14  Job:  [id number]

## 2010-10-27 NOTE — Op Note (Signed)
Marie Chen, Marie Chen            ACCOUNT NO.:  [id number]   MEDICAL RECORD NO.:  [id number]          PATIENT TYPE:  AMB   LOCATION:  DSC                          FACILITY:  [facility]   PHYSICIAN:  Feliberto Gottron. Turner Daniels, M.D.   DATE OF BIRTH:  May 06, 1961   DATE OF PROCEDURE:  08/01/2004  DATE OF DISCHARGE:                                 OPERATIVE REPORT   PREOPERATIVE DIAGNOSIS:  Right knee chondromalacia patella and trochlea.   POSTOPERATIVE DIAGNOSIS:  Right knee chondromalacia patella and trochlea.   PROCEDURE:  Right knee partial arthroscopic debridement of chondromalacia of  the patella and the trochlea grade 3 with flap tears.   SURGEON:  Feliberto Gottron. Turner Daniels, M.D.   ASSISTANT:  None.   ANESTHETIC:  General endotracheal.   ESTIMATED BLOOD LOSS:  Minimal.   FLUID REPLACEMENT:  900 cc crystalloid.   INDICATIONS FOR PROCEDURE:  A 50 year old woman who banged her patella on a  cabinet some months ago and had persistent catching, popping and pain since.  An MRI scan showed some degenerative changes of the patellofemoral joint.  She has positive patellofemoral apprehension sign and quad capture tests and  has failed conservative management with Eula Listen, M.D. one of my  partners, including physical therapy, attempts at weight loss, she weighs  310 pounds, as well as anti-inflammatory medicines and cortisone shot. She  desires elective arthroscopic evaluation and treatment of her right knee and  is well aware the risks and benefits of surgery.   DESCRIPTION OF PROCEDURE:  The patient identified by armband and taken the  operating room at [facility].  Appropriate anesthetic monitors  were attached and general endotracheal anesthesia induced with the patient  supine position. Lateral post applied to the table and the right lower  extremity prepped and draped in sterile fashion from the ankle to the  midthigh.  Using a #11 blade, standard inferomedial, inferolateral  peripatellar portals were made after infiltrating these areas with 0.5%  Marcaine and epinephrine solution using an 18 gauge needle and after the  administration of 1 gram of Ancef preoperatively for prophylaxis.  Distally  on the right leg she had a 4 x 3 cm area of contact dermatitis that was  draped out.  After the portals were made, the arthroscope was inserted  through the inferolateral portal, and the outflow through the inferomedial  portal. Diagnostic arthroscopy revealed grade 3 chondromalacia with flap  tears to the apex of the patella and more importantly, the trochlea,  especially the lateral facet. These areas were debrided back to stable  margin using a 3.5 gator sucker shaver through the medial and lateral  portal.  The rest of the articular and meniscal cartilages were essentially  pristine as was the ACL and PCL.  The gutters were cleared medially and  laterally and the menisci were thoroughly probed and found to be intact.  The bottom line, patellofemoral joint had grade 3 chondromalacia flap tears  and the rest of the knee  was normal. After successful debridement, the knee was irrigated out with  normal saline solution. The arthroscopic instruments were removed.  A  dressing of Xeroform, 4x4 dressing sponges, Webril and Ace wrap was applied.  The patient was then undraped, awakened and taken to the recovery room  without difficulty.      FJR/MEDQ  D:  08/01/2004  T:  08/01/2004  Job:  [id number]

## 2010-10-27 NOTE — Op Note (Signed)
NAME:  Marie Chen, Marie Chen                      ACCOUNT NO.:  [id number]   MEDICAL RECORD NO.:  [id number]                   PATIENT TYPE:  AMB   LOCATION:  DAY                                  FACILITY:  [facility]   PHYSICIAN:  Lazaro Arms, M.D.                DATE OF BIRTH:  1960-10-22   DATE OF PROCEDURE:  08/19/2003  DATE OF DISCHARGE:                                 OPERATIVE REPORT   PREOPERATIVE DIAGNOSIS:  Menometrorrhagia.   POSTOPERATIVE DIAGNOSIS:  Menometrorrhagia.   PROCEDURE:  Hysteroscopy, D&C, endometrial ablation.   SURGEON:  Lazaro Arms, M.D.   ANESTHESIA:  General endotracheal.   FINDINGS:  The patient had a normal endometrial cavity and no abnormalities.   DESCRIPTION OF PROCEDURE:  The patient was taken to the operating room and  placed in the supine position where she underwent general endotracheal  anesthesia.  She was placed in the dorsal lithotomy position and prepped and  draped in the usual sterile fashion.  The bladder was drained.   The cervix was grasped.  A paracervical block was placed using 0.5% Marcaine  with 1:200,000 epinephrine.  The cervix was dilated serially to allow  passage of the hysteroscope.  Hysteroscopy was normal.  Curettage was  performed.  Good uterine cry in all areas.  Endometrial ablation was then  performed.  It required 13 cc of fluid.  Total therapy time because she had  a lot of inherent uterine pressure expelled the balloon at 5 minutes 25  seconds.  It was heated to 87 degrees Celsius during this time.   The patient tolerated the procedure well.  She was awakened from anesthesia  and taken to the recovery room in good and stable condition.  All counts  were correct.      ___________________________________________                                            Lazaro Arms, M.D.   LHE/MEDQ  D:  08/19/2003  T:  08/19/2003  Job:  [id number]

## 2010-10-27 NOTE — H&P (Signed)
NAME:  Marie Chen, Marie Chen                        ACCOUNT NO.:  [id number]   MEDICAL RECORD NO.:  [id number]                   PATIENT TYPE:  AMB   LOCATION:  DAY                                  FACILITY:  [facility]   PHYSICIAN:  Lazaro Arms, M.D.                DATE OF BIRTH:  03-02-1961   DATE OF ADMISSION:  DATE OF DISCHARGE:                                HISTORY & PHYSICAL   HISTORY OF PRESENT ILLNESS:  Marie Chen is a 50 year old white female, morbidly  obese, who has very heavy periods.  She has to wear 2 pads together and a  towel and sort of a Chuck at night during her heavy days of her cycle.  The  patient had a hysteroscopy back in May of 2003 and her bleeding did respond  for some time thereafter.  She started bleeding heavily on July 11, 2003  and I saw her on July 19, 2003.  I placed her on Megace, which stopped  her bleeding.  I did an endometrial stripe evaluation and it was essentially  normal.  She had a small right ovarian cyst that was consistent with just a  normal follicular cyst or hemorrhagic corpus luteum.  She also had quite a  bit of pain with her bleeding.  We discussed options and we are going to  proceed with a hysteroscopy, D&C and endometrial ablation.   PAST MEDICAL HISTORY:  Past medical history significant for:  1. Hypertension.  2. Asthma.  3. Hypothyroidism.  4. Gastroesophageal reflux.   PAST SURGICAL HISTORY:  1. Tubal ligation in 1984.  2. Hysteroscopy in May of 2003 at [facility].  3. Bronchoscopy in April of 2003.  4. Laparoscopic cholecystectomy in November of 2002.   ALLERGIES:  Her allergies are to MACRODANTIN, TETRACYCLINE and DILAUDID.   MEDICATIONS:  Her medications currently are:  1. Hydrochlorothiazide.  2. Levoxyl 200 mcg a day.   PHYSICAL EXAMINATION:  VITAL SIGNS:  She is 5 foot 9 and 328 pounds.  Blood  pressure is 140/90.  HEENT:  Unremarkable.  NECK:  Thyroid is normal.  LUNGS:  Lungs are clear.  HEART:  Heart is  regular rate and rhythm without murmur, regurgitation or  gallop.  BREASTS:  Exam is negative.  ABDOMEN:  Abdomen is obese but benign.  PELVIC:  Exam is very difficult.  Ultrasound showed everything as I stated  above.  EXTREMITIES:  Extremities are warm with 2+ edema.  NEUROLOGICAL:  Exam is grossly intact.   IMPRESSION:  1. Menometrorrhagia.  2. Dysmenorrhea.  3. Morbid obesity.   PLAN:  The patient is admitted for hysteroscopy, D&C and endometrial  ablation.  She understands the risks, benefits, indications and alternatives  and will proceed.     ___________________________________________  Lazaro Arms, M.D.   Loraine Maple  D:  08/18/2003  T:  08/19/2003  Job:  [id number]

## 2010-10-27 NOTE — Assessment & Plan Note (Signed)
[facility]                                 ON-CALL NOTE   NAME:Marie Chen, Marie Chen                   MRN:          [id number]  DATE:01/12/2007                            DOB:          21-Apr-1961    TIME:  3:12 p.m.   PHONE NUMBER:  [phone].   CHIEF COMPLAINT:  Severe headache.   Patient states that she is having an especially severe headache with  pain and swelling in the back of her neck.  She is very nauseated.  She  states that she has this before, and she generally has to go to the  hospital for a shot.  She is not having fever, chills, or any vomiting  right now.   I told her to go on to the emergency room and get checked out, and that  is what she is going to do.     Marne A. Tower, MD  Electronically Signed    MAT/MedQ  DD: 01/12/2007  DT: 01/12/2007  Job #: [id number]   cc:   Corwin Levins, MD

## 2010-10-27 NOTE — Assessment & Plan Note (Signed)
[facility]                             PULMONARY OFFICE NOTE   NAME:Marie Chen, Marie Chen                   MRN:          [id number]  DATE:08/22/2006                            DOB:          [birth year]    SLEEP CONSULTATION:  I met Marie Chen today for evaluation of sleep  difficulties.   She says she has had problems with feeling tired for the last several  years.  She had undergone a surgical repair for a hammer toe in December  2007 and during her perioperative course the anesthesiologist had made a  comment that she may, in fact, have sleep apnea.  As a result, she is  pursuing further evaluation of this.  She has been working third shift  for the last one year.  She works Monday through Friday nights from 9  p.m. to 5:30 a.m.  On the days that she works, her sleep pattern is that  she will go to bed at 6:30 in the morning and wake up at 10:30 in the  morning and then go back to sleep from 4 p.m. to 7 p.m.  On the days she  is not working, she sleeps from about midnight to 8-10 o'clock in the  morning, although she will still take a nap at about 4:00 in the  afternoon.  She says her sleep is quite restless and she does snore at  night.  She will wake up frequently with a choking sensation.  She tends  to breathe more through her mouth and get a dry mouth at night.  She  sometimes also wakes up feeling sweaty as well as feeling palpitations,  and she has been told that she used to grind her teeth.  She denies any  history of sleepwalking, sleep-talking, nightmares or night terrors.  There is no history of sleep hallucinations, sleep paralysis or  cataplexy.  She gets a tingling feeling in her legs as well as a  sensation of leg jerks about 2-3 times per week.  She says this  typically happens around the time she is ready to go to bed and it gets  better if it moves her legs around but will be worse if she just tries  to lie still.  She is not  currently using anything to help her fall  asleep at night.  She drinks one cup of coffee during the day.  Her  Epworth score is 15/24.   PAST MEDICAL HISTORY:  1. Hypertension.  2. Asthma.  3. Elevated cholesterol.  4. Allergies.  5. Increased liver function tests.  6. Reflux disease.  7. Hypothyroidism.  8. Depression.   PAST SURGICAL HISTORY:  1. Cholecystectomy.  2. She has had tubal ligation.  3. Hammer toe repair.  4. Carpal tunnel release.  5. Clavicle resection.  6. Hysteroscopy.   CURRENT MEDICATIONS:  Hydrochlorothiazide, Prozac, amlodipine,  levothyroxine, Lipitor and Prempro.   She has allergies to DILAUDID, TETRACYCLINE, MACRODANTIN and DEMEROL.   SOCIAL HISTORY:  She is married.  She has two children of her own and  two stepchildren.  She smokes 5 cigarettes  a day.  She denies any  significant alcohol use.   FAMILY HISTORY:  Significant for her father who had heart disease,  bleeding disorder and diabetes.  Her mother had rheumatoid arthritis and  Cushing disease.  She has a brother with sleep apnea and hypertension,  another brother with reflux and allergies, and her daughter has asthma.   REVIEW OF SYSTEMS:  She said that she is currently on a diet regimen and  has lost approximately 50 pounds over the last one year.   PHYSICAL EXAMINATION:  VITAL SIGNS:  She is 5 feet 9 inches tall.  Weight is 300 pounds.  Temperature is 97.9, blood pressure is 110/60,  heart rate is 81, oxygen saturation 95% on room air.  HEENT:  No sinus tenderness, no nasal discharge.  Pupils reactive.  She  is edentulous.  She history a Mallampati 3 airway with 2+ tonsils, a low-  lying soft palate and oropharyngeal crowding.  NECK:  No lymphadenopathy, no thyromegaly.  HEART:  S1 and S2.  CHEST:  No wheezing or rales.  ABDOMEN:  Obese, soft, nontender.  EXTREMITIES:  No edema.  NEUROLOGIC:  She is alert and oriented x3, 5/5 strength.  No cerebellar  deficits were  appreciated.   IMPRESSION:  1. She certainly has symptoms as well as physical findings which could      be suggestive of sleep-disordered breathing.  This is particularly      of concern given her history of hypertension and hypothyroidism.  I      have discussed with her the importance of diet, exercise and weight      reduction as well as avoidance of alcohol and sedatives.  Driving      precautions were discussed with her as well.  I have reviewed the      adverse consequences of untreated sleep apnea, including increased      risk of diabetes, coronary disease, cerebrovascular disease, and      hypertension.  To further evaluate this, I will make arrangements      for her to undergo an overnight polysomnogram.  2. Symptoms consistent with restless legs syndrome.  I will defer      further evaluation of this until after the review of her overnight      polysomnogram.  Then, depending upon her symptom status, she may      need to have laboratory assessment of her iron levels and if her      ferritin level is less than 50, she may benefit from the addition      of iron supplementation with vitamin C.  Otherwise, she may benefit      from the use of a dopamine agonist.  3. Shift work syndrome.  I have discussed with her various techniques      to try and improve her sleep quality such as consolidating the      sleep time and appropriately timing naps as well as maintaining a      well-lit environment during her work schedule.  Additionally, I      have discussed with her to try and maintain her sleep-wake schedule      as best she can throughout the week.  4. Tobacco use.  I have discussed with her several techniques with      regard to smoking cessation.  She says at the present time that she      feels like she could quit on her own.   I  will follow up with her after review of her sleep study.     Coralyn Helling, MD  Electronically Signed   VS/MedQ  DD: 08/22/2006  DT: 08/24/2006   Job #: [id number]   cc:   Ernestina Penna, M.D.

## 2010-10-27 NOTE — Op Note (Signed)
[facility]  Patient:    Marie Chen, Marie Chen Visit Number: [id number] MRN: [id number]          Service Type: NES Location: [facility] Attending Physician:  Katrina Stack Dictated by:   Gretta Cool, M.D. Proc. Date: 10/23/01 Admit Date:  10/23/2001 Discharge Date: 10/23/2001                             Operative Report  PREOPERATIVE DIAGNOSIS:  Menorrhagia with endometrial polyps suspected at sonography.  POSTOPERATIVE DIAGNOSIS:  Menorrhagia with endometrial polyps suspected at sonography.  PROCEDURES: 1. Hysteroscopy. 2. Resection of the endometrium total for ablation. 3. VaporTrode.  SURGEON:  Gretta Cool, M.D.  ANESTHESIA:  IV sedation and paracervical block.  DESCRIPTION OF PROCEDURE:  Under excellent anesthesia with the patient prepped and draped in the lithotomy position in [place] stirrups, the cervix was grasped with a single-tooth tenaculum and progressively dilated to accommodate a 7 mm resectoscope.  The resectoscope was then introduced and they cavity photographed.  There were no dominant polyps, but very thickened endometrium was present.  She had resection of the entire endometrial cavity 360 degrees around the cavity.  The entire endometrial cavity was then treated by cautery with the loop so as to eliminate any islands of endometrial tissue that persisted.  At this point, the procedure was terminated without complication. There was no significant bleeding, a reduced pressure, and negligible fluid deficit.  At this point, the procedure was terminated and the patient returned to the recovery room in excellent. Dictated by:   Gretta Cool, M.D. Attending Physician:  Katrina Stack DD:  10/28/01 TD:  10/28/01 Job: [id number]

## 2010-10-27 NOTE — Op Note (Signed)
[facility]  Patient:    Marie Chen, Marie Chen                   MRN: [id number] Proc. Date: 04/29/00 Adm. Date:  [id number] Attending:  Chevis Pretty S                           Operative Report  PREOPERATIVE DIAGNOSIS:  Biliary dyskinesia.  POSTOPERATIVE DIAGNOSIS:  Biliary dyskinesia.  PROCEDURE:  Laparoscopic cholecystectomy.  SURGEON:  Chevis Pretty, M.D.  ASSISTANT:  Milus Mallick, M.D.  ANESTHESIA:  General endotracheal.  PROCEDURE:  After an informed consent was obtained the patient was brought to the operating room and placed in the supine position on the operating table. After adequate induction of general endotracheal anesthesia the patients abdomen was prepped with Betadine and draped in the usual sterile manner.  A small transverse supraumbilical incision was made with the #15 blade knife. This was carried down through the skin and subcutaneous tissue using blunt dissection with the Kelly clamp and Army-Navy retractors.  This was done until the fascia of the anterior abdominal wall was encountered.  Once the fascia of the anterior abdominal wall on the midline was visible this fascial layer was incised vertically with the #15 blade knife, and then each side was grasped with Kocher clamps.  The edges of the fascia were elevated, the incision was then probed with a Kelly clamp bluntly until access was gained in the peritoneal cavity.  A finger was inserted through this hole and the anterior abdominal wall was abdominal wall was palpated; seemed to be free of any adhesions.  A 0 Vicryl pursestring suture was placed in the fascia around this hole and a Hasson cannula was then placed through this hole into the abdomen. The abdomen was then insufflated with carbon dioxide, and a laparoscope was inserted through the Hasson cannula.  There was good visualization of the liver edge and gallbladder.  A small transverse upper midline incision was then  made with the #15 blade knife, and a 10 mm port was placed through this bluntly into the abdominal cavity under direct vision using a laparoscope. Next, two smaller transverse incisions were placed laterally in the anterior abdominal wall on the right side with the #15 blade knife; and two 5 mm ports were placed through these incisions into the abdominal cavity, again under direct vision.   A blunt grasper was placed through the lateral-most port and used to grasp the dome of the gallbladder, elevate it anteriorly and superiorly.  Another blunt grasper was placed through the other lateral port, then used to grasp the base and neck of the gallbladder for retraction laterally.  A [place] dissector was placed through the upper midline port, and used to bluntly open the peritoneal reflection over top of the gallbladder neck and cystic duct.  Using a combination of blunt dissection as well as sharp dissection with Bovie electrocautery hooked to the [place] sector, the neck of the gallbladder and cystic duct were dissected in a circumferential manner until the cystic duct-gall bladder neck junction was clearly identified.   Care was taken to keep the common duct medial to this.  Three clips were then placed proximally on the cystic duct and one distally towards the gallbladder side.  The cystic duct was divided between the two sets of clips.  The main cystic artery was identified posterior to this and was dissected in a circumferential manner, along  with two other smaller vascular-appearing structures entering into the gallbladder.  All of these had clips placed proximally and distally, and each of these structures was divided between the two.  Once this was complete, the gallbladder was dissected from the liver bed using a combination of blunt dissection as well as sharp dissection with a hook cautery; until the gallbladder was freed from the liver bed.  The liver bed was then inspected and a  couple of small bleeding points were coagulated with the Bovie electrocautery.  Hemostasis was excellent.  The laparoscope was then moved to the upper midline port.  A gallbladder grasper was placed through the Hasson cannula.  Using this instrument the neck of the gallbladder was grasped and then removed from the abdomen through the umbilical port.  The liver bed was then irrigated with saline and this suction effluent was clear.  The lateral ports were then removed under direct vision. The umbilical port was inspected and the fascial defect was closed nicely. The insufflation was then evacuated from the abdomen and the upper midline port was removed.  The fascia at the umbilical incision was closed with the previously placed pursestring suture.  The skin was closed with interrupted 4-0 Monocryl subcuticular stitches.  Benzoin and Steri-Strips and sterile dressings were applied.  The patient tolerated the procedure well.  At the end of the case all sponge, needle and instrument counts were correct.  The patient was awakened and taken to the recovery room in stable condition. DD:  04/29/00 TD:  04/29/00 Job: [id number]

## 2010-10-27 NOTE — Assessment & Plan Note (Signed)
[facility]                             PULMONARY OFFICE NOTE   NAME:Chen, Marie NOREN                   MRN:          [id number]  DATE:10/14/2006                            DOB:          01/29/1961    I received the auto CPAP download for Marie Chen.  It appears that  she has her sleep apnea reasonably well controlled on a pressure setting  of 8, although she does not have very much usage of her machine.  I will  continue her on a face pressure setting of 8 for her CPAP machine, and  then follow up with her in the office, to determine if any further  adjustments will need to be made.     Coralyn Helling, MD  Electronically Signed    VS/MedQ  DD: 10/14/2006  DT: 10/14/2006  Job #: [id number]   cc:   Ernestina Penna, M.D.

## 2010-10-27 NOTE — Assessment & Plan Note (Signed)
[facility]                             PULMONARY OFFICE NOTE   NAME:[name], Marie Chen                   MRN:          [id number]  DATE:09/17/2006                            DOB:          1961-05-06    REFERRING PHYSICIAN:  Ernestina Penna, M.D.   PULMONARY FOLLOWUP VISIT   I saw [name] today in followup after she had undergone her  overnight polysomnogram.   This was done on September 01, 2006, and it showed evidence for moderate to  severe obstructive sleep apnea with an overall apnea/hypopnea index of  21 and an oxygen saturation at 77%.  She did have a significant REM as  well as positional effect.  She also had an increase in her periodic  limb movement index to 43.   I had reviewed the results of her sleep study with her.  I had discussed  with her the treatment options for her sleep apnea, including CPAP, oral  appliance, or surgical intervention, in addition to diet, exercise, and  weight reduction.  Given the severity of her sleep apnea, I had  recommended that CPAP would be her best initial approach.   I will make arrangements for her to undergo an auto CPAP titration study  for 2 weeks, and then upon review of this, would initiate a fixed  pressure setting for her CPAP.  If she is not able to tolerate this, I  have discussed with her that she would need to return to the sleep lab  for an in-lab CPAP titration study.   Additionally, with regards to her symptoms of restless leg syndrome, I  would like to see how she does after being initiated on CPAP therapy,  and then reassess this.   With regards to her shift work syndrome, she says that she is looking  into shifting her job schedule.  With regards to her tobacco use, she  again says that she would like to work on this on her own.   I will follow up with her in approximately 6 to 8 weeks.     Coralyn Helling, MD  Electronically Signed    VS/MedQ  DD: 09/18/2006  DT:  09/18/2006  Job #: [id number]   cc:   Ernestina Penna, M.D.

## 2011-03-05 LAB — DIFFERENTIAL
Basophils Absolute: 0
Basophils Relative: 0
Eosinophils Absolute: 0.1
Eosinophils Relative: 1
Lymphocytes Relative: 19
Lymphs Abs: 2.2
Monocytes Absolute: 0.7
Monocytes Relative: 6
Neutro Abs: 8.5 — ABNORMAL HIGH
Neutrophils Relative %: 74

## 2011-03-05 LAB — COMPREHENSIVE METABOLIC PANEL
Alkaline Phosphatase: 100
BUN: 10
CO2: 27
GFR calc non Af Amer: >60
Glucose, Bld: 164 — ABNORMAL HIGH
Potassium: 3.5
Total Bilirubin: 0.6
Total Protein: 7

## 2011-03-05 LAB — LIPASE, BLOOD: Lipase: 22

## 2011-03-05 LAB — CBC
HCT: 39.8
Hemoglobin: 13.2
MCHC: 33.1
MCV: 84.4
Platelets: 330
RBC: 4.71
RDW: 13.7
WBC: 11.6 — ABNORMAL HIGH

## 2011-03-05 LAB — URINALYSIS, ROUTINE W REFLEX MICROSCOPIC
Bilirubin Urine: NEGATIVE
Glucose, UA: NEGATIVE
Hgb urine dipstick: NEGATIVE
Ketones, ur: NEGATIVE
Nitrite: NEGATIVE
Protein, ur: NEGATIVE
Specific Gravity, Urine: 1.021
Urobilinogen, UA: 0.2
pH: 6.5

## 2011-03-08 LAB — INFLUENZA A+B VIRUS AG-DIRECT(RAPID)

## 2011-03-20 LAB — I-STAT 8, (EC8 V) (CONVERTED LAB)
Acid-Base Excess: 1
Chloride: 104
Glucose, Bld: 111 — ABNORMAL HIGH
Potassium: 4
pCO2, Ven: 30.3 — ABNORMAL LOW
pH, Ven: 7.491 — ABNORMAL HIGH

## 2011-03-20 LAB — BASIC METABOLIC PANEL
BUN: 6
CO2: 26
Calcium: 8.6
Chloride: 106
Creatinine, Ser: 0.71
GFR calc Af Amer: >60

## 2011-03-20 LAB — EXPECTORATED SPUTUM ASSESSMENT W GRAM STAIN, RFLX TO RESP C

## 2011-03-20 LAB — CBC
HCT: 39.6
Hemoglobin: 13.3
MCHC: 33.6
MCHC: 33.7
MCV: 86.9
Platelets: 234
RBC: 4.57
RDW: 14.9

## 2011-03-20 LAB — POCT I-STAT CREATININE: Operator id: 239701

## 2011-03-20 LAB — DIFFERENTIAL
Basophils Absolute: 0
Basophils Relative: 0
Eosinophils Relative: 1
Lymphocytes Relative: 32
Monocytes Absolute: 0.7
Monocytes Relative: 11
Neutro Abs: 3.3

## 2011-03-20 LAB — CULTURE, RESPIRATORY W GRAM STAIN: Culture: NORMAL

## 2011-08-07 ENCOUNTER — Encounter (HOSPITAL_COMMUNITY): Payer: Self-pay | Admitting: Emergency Medicine

## 2011-08-07 ENCOUNTER — Emergency Department (HOSPITAL_COMMUNITY)
Admission: EM | Admit: 2011-08-07 | Discharge: 2011-08-07 | Disposition: A | Discharge status: Home / Self Care | Payer: Self-pay | Source: Home / Self Care | Attending: Emergency Medicine | Admitting: Emergency Medicine

## 2011-08-07 DIAGNOSIS — B029 Zoster without complications: Secondary | ICD-10-CM

## 2011-08-07 HISTORY — DX: Disorder of thyroid, unspecified: E07.9

## 2011-08-07 HISTORY — DX: Essential (primary) hypertension: I10

## 2011-08-07 HISTORY — DX: Gastro-esophageal reflux disease without esophagitis: K21.9

## 2011-08-07 HISTORY — DX: Depression, unspecified: F32.A

## 2011-08-07 HISTORY — DX: Major depressive disorder, single episode, unspecified: F32.9

## 2011-08-07 MED ORDER — ACYCLOVIR 400 MG PO TABS: 800.0000 mg | ORAL_TABLET | ORAL | Status: AC

## 2011-08-07 MED ORDER — PREDNISONE 5 MG PO KIT: 1.0000 | PACK | Freq: Every day | ORAL | Status: DC

## 2011-08-07 MED ORDER — OXYCODONE-ACETAMINOPHEN 5-325 MG PO TABS: ORAL_TABLET | ORAL | Status: AC

## 2011-08-07 NOTE — ED Notes (Signed)
Ck with pt. Nurse in doing eval. Per rn ok gave pt soda.

## 2011-08-07 NOTE — ED Notes (Signed)
Reports head pain Friday am, seen at [facility] and treated.  Patient was told she had "inflammation of a nerve".  Patient reports pain to left scalp and down left neck.  Bumps have started yesterday and multiplied today.  Bumps burn more than itch.

## 2011-08-07 NOTE — Discharge Instructions (Signed)

## 2011-08-07 NOTE — ED Provider Notes (Signed)
Chief Complaint  Patient presents with  . Rash    History of Present Illness:   Marie Chen is a 51 year old female. For the past 5 days she's had pain that seems to center around her left ear but spreads to the entire left side of the scalp and left side of the face as well. The day after this began, she went to the emergency room and was told this could be an inflamed nerve. She did not have a rash at that time, however today she has broken out in patches of blisters in the area of the pain extending from the scalp on down to the face. She denies any involvement of the eye or the tip of the nose. There is no weakness or numbness. No other lesions anywhere else.  Review of Systems:  Other than noted above, the patient denies any of the following symptoms: Systemic:  No fever, chills, sweats, weight loss, or fatigue. ENT:  No nasal congestion, rhinorrhea, sore throat, swelling of lips, tongue or throat. Resp:  No cough, wheezing, or shortness of breath. Skin:  No rash, itching, nodules, or suspicious lesions.  PMFSH:  Past medical history, family history, social history, meds, and allergies were reviewed.  Physical Exam:   Vital signs:  BP 145/73  Pulse 65  Temp(Src) 97.9 F (36.6 C) (Oral)  Resp 20  SpO2 97% Gen:  Alert, oriented, in no distress. Eye exam: No conjunctival injection or swelling of the lid. No lesions around the eye or on the tip of the nose. Skin:  She has patches of vesicles on the left temporal area, left parietal area, and from the ear, and the left cheek. These are very tender to touch. Skin was otherwise clear. Neurological exam: Cranial nerves were intact.  Assessment:   Diagnoses that have been ruled out:  None  Diagnoses that are still under consideration:  None  Final diagnoses:  Shingles    Plan:   1.  The following meds were prescribed:   New Prescriptions   ACYCLOVIR (ZOVIRAX) 400 MG TABLET    Take 2 tablets (800 mg total) by mouth every 4 (four) hours  while awake.   OXYCODONE-ACETAMINOPHEN (PERCOCET) 5-325 MG PER TABLET    1 to 2 tablets every 6 hours as needed for pain.   PREDNISONE 5 MG KIT    Take 1 kit (5 mg total) by mouth daily after breakfast. Prednisone 5 mg 6 day dosepack.  Take as directed.   2.  The patient was instructed in symptomatic care and handouts were given. 3.  The patient was told to return if becoming worse in any way, if no better in 3 or 4 days, and given some red flag symptoms that would indicate earlier return.     Roque Lias, MD 08/07/11 1700

## 2011-08-08 ENCOUNTER — Telehealth (HOSPITAL_COMMUNITY): Payer: Self-pay | Admitting: *Deleted

## 2011-08-08 NOTE — ED Notes (Signed)
Pt. called on VM @ 0914 and 1009. The first call she said she had an open lesion of shingles and asked if she was contagious. The second call she asked for a doctors note. I called pt. back and told her the discharge instructions tell her who to avoid ie babies pregnant women etc. I told her, she got a work note to return 2/26.  She said she did not go today. 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 She does not work the weekend. Her boss said that would put her into short term disability. She wants the note faxed to her work. I told her I could not fax it, she will have to pick it up. I told her we don't do short term disablity forms because we are not primary care. I told her I would ask Dr. Lorenz Coaster and call her back. Dr. Lorenz Coaster said note can be changed to return tomorrow 2/28. Note done as directed and put at front desk. I called pt. and left her a message to pick up the note. Vassie Moselle 08/08/2011

## 2011-08-09 ENCOUNTER — Emergency Department (HOSPITAL_COMMUNITY)
Admission: EM | Admit: 2011-08-09 | Discharge: 2011-08-09 | Disposition: A | Discharge status: Home Health | Payer: Self-pay | Source: Home / Self Care | Attending: Emergency Medicine | Admitting: Emergency Medicine

## 2011-08-09 ENCOUNTER — Encounter (HOSPITAL_COMMUNITY): Payer: Self-pay | Admitting: Emergency Medicine

## 2011-08-09 DIAGNOSIS — B029 Zoster without complications: Secondary | ICD-10-CM

## 2011-08-09 MED ORDER — OXYCODONE-ACETAMINOPHEN 5-325 MG PO TABS: ORAL_TABLET | ORAL | Status: AC

## 2011-08-09 NOTE — ED Notes (Signed)
PT HERE FOR F/U SHINGLES TO LEFT SIDE OF FACE,NECK AND SCALP THAT STARTED 08/03/11.PT WAS SEEN HERE ON 2/26 AND PRESCRIBED ACYCLOVIR 400MG  ,PREDNISONE 5MG  6 DAY PACK AND OXYCODONE BUT STATES THE ITCHING AND BURNING PAIN HAS INCREASED WITH CHILLS.PT ALSO REPORTS THAT SHE'S UNABLE TO RETURN TO WORK X 72 HRS POST MEDICATION ACCORDING TO SUPERVISOR

## 2011-08-09 NOTE — ED Provider Notes (Signed)
Chief Complaint  Patient presents with  . Wound Check    History of Present Illness:   Marie Chen returns today for followup on her shingles. It's still painful and irritated. She has a couple more vesicles, most extending down onto the neck. She denies any around the eye or on the tip of the nose. Her vision has been normal. She states is painful and itchy. She needs a note for no day off of work.  Review of Systems:  Other than noted above, the patient denies any of the following symptoms: Systemic:  No fever, chills, sweats, weight loss, or fatigue. ENT:  No nasal congestion, rhinorrhea, sore throat, swelling of lips, tongue or throat. Resp:  No cough, wheezing, or shortness of breath. Skin:  No rash, itching, nodules, or suspicious lesions.  PMFSH:  Past medical history, family history, social history, meds, and allergies were reviewed.  Physical Exam:   Vital signs:  BP 142/79  Pulse 86  Temp(Src) 98.9 F (37.2 C) (Oral)  Resp 18  SpO2 97% Gen:  Alert, oriented, in no distress. Skin:  Her shingles looks about the same as they did yesterday with fall. He shows of this vesicles and maculopapules left cheek, left scalp, and a couple of vesicles on the neck. There were none around the iron on the tip of the nose. The eye itself appears normal. TMs are normal. No intraoral lesions.  Assessment:   Diagnoses that have been ruled out:  None  Diagnoses that are still under consideration:  None  Final diagnoses:  Shingles    Plan:   1.  The following meds were prescribed:   New Prescriptions   OXYCODONE-ACETAMINOPHEN (PERCOCET) 5-325 MG PER TABLET    1 to 2 tablets every 6 hours as needed for pain.   2.  The patient was instructed in symptomatic care and handouts were given. 3.  The patient was told to return if becoming worse in any way, if no better in 3 or 4 days, and given some red flag symptoms that would indicate earlier return. She was given a note for one more day off  work.     Roque Lias, MD 08/09/11 2055

## 2011-08-09 NOTE — Discharge Instructions (Signed)

## 2011-10-25 ENCOUNTER — Other Ambulatory Visit: Payer: Self-pay | Admitting: Family Medicine

## 2011-10-25 DIAGNOSIS — N644 Mastodynia: Secondary | ICD-10-CM

## 2011-10-31 ENCOUNTER — Ambulatory Visit
Admission: RE | Admit: 2011-10-31 | Discharge: 2011-10-31 | Disposition: A | Discharge status: Home / Self Care | Payer: Self-pay | Source: Ambulatory Visit | Attending: Family Medicine | Admitting: Family Medicine

## 2011-10-31 ENCOUNTER — Other Ambulatory Visit: Payer: Self-pay | Admitting: Family Medicine

## 2011-10-31 DIAGNOSIS — N644 Mastodynia: Secondary | ICD-10-CM

## 2011-11-24 ENCOUNTER — Encounter (HOSPITAL_COMMUNITY): Payer: Self-pay | Admitting: Emergency Medicine

## 2011-11-24 ENCOUNTER — Emergency Department (HOSPITAL_COMMUNITY): Payer: Self-pay

## 2011-11-24 ENCOUNTER — Emergency Department (HOSPITAL_COMMUNITY)
Admission: EM | Admit: 2011-11-24 | Discharge: 2011-11-24 | Disposition: A | Discharge status: Home / Self Care | Payer: Self-pay | Attending: Emergency Medicine | Admitting: Emergency Medicine

## 2011-11-24 DIAGNOSIS — K122 Cellulitis and abscess of mouth: Secondary | ICD-10-CM | POA: Insufficient documentation

## 2011-11-24 DIAGNOSIS — I10 Essential (primary) hypertension: Secondary | ICD-10-CM | POA: Insufficient documentation

## 2011-11-24 DIAGNOSIS — K219 Gastro-esophageal reflux disease without esophagitis: Secondary | ICD-10-CM | POA: Insufficient documentation

## 2011-11-24 DIAGNOSIS — E079 Disorder of thyroid, unspecified: Secondary | ICD-10-CM | POA: Insufficient documentation

## 2011-11-24 HISTORY — DX: Unspecified asthma, uncomplicated: J45.909

## 2011-11-24 LAB — CBC
Hemoglobin: 13.1 g/dL (ref 12.0–15.0)
Platelets: 257 10*3/uL (ref 150–400)
RBC: 4.53 MIL/uL (ref 3.87–5.11)
WBC: 8.4 10*3/uL (ref 4.0–10.5)

## 2011-11-24 LAB — BASIC METABOLIC PANEL
CO2: 25 mEq/L (ref 19–32)
Calcium: 9.2 mg/dL (ref 8.4–10.5)
Chloride: 105 mEq/L (ref 96–112)
Potassium: 3.6 mEq/L (ref 3.5–5.1)
Sodium: 140 mEq/L (ref 135–145)

## 2011-11-24 LAB — RAPID STREP SCREEN (MED CTR MEBANE ONLY): Streptococcus, Group A Screen (Direct): NEGATIVE

## 2011-11-24 MED ORDER — PENICILLIN G BENZATHINE 1200000 UNIT/2ML IM SUSP
1.2000 10*6.[IU] | Freq: Once | INTRAMUSCULAR | Status: AC
  Administered 2011-11-24: 1.2 10*6.[IU] via INTRAMUSCULAR
  Filled 2011-11-24: qty 2

## 2011-11-24 MED ORDER — IOHEXOL 300 MG/ML  SOLN
75.0000 mL | Freq: Once | INTRAMUSCULAR | Status: AC | PRN
  Administered 2011-11-24: 75 mL via INTRAVENOUS

## 2011-11-24 MED ORDER — DIPHENHYDRAMINE HCL 25 MG PO CAPS
50.0000 mg | ORAL_CAPSULE | Freq: Once | ORAL | Status: DC
  Filled 2011-11-24: qty 2

## 2011-11-24 MED ORDER — PREDNISONE 20 MG PO TABS
60.0000 mg | ORAL_TABLET | Freq: Once | ORAL | Status: DC
  Filled 2011-11-24: qty 3

## 2011-11-24 MED ORDER — DIPHENHYDRAMINE HCL 50 MG/ML IJ SOLN
25.0000 mg | Freq: Once | INTRAMUSCULAR | Status: AC
  Administered 2011-11-24: 25 mg via INTRAVENOUS
  Filled 2011-11-24: qty 1

## 2011-11-24 MED ORDER — METHYLPREDNISOLONE SODIUM SUCC 125 MG IJ SOLR
125.0000 mg | Freq: Once | INTRAMUSCULAR | Status: AC
  Administered 2011-11-24: 125 mg via INTRAVENOUS
  Filled 2011-11-24: qty 2

## 2011-11-24 NOTE — ED Notes (Signed)
Pt. To CT

## 2011-11-24 NOTE — ED Notes (Signed)
Pt. Reports that she woke up this morning with a swollen mouth and that she feels like she is having trouble breathing. Denies starting any new medications.

## 2011-11-24 NOTE — ED Notes (Signed)
Pt. Back from CT.

## 2011-11-24 NOTE — Discharge Instructions (Signed)
Return to the ED with any concerns including difficulty breathing, drooling, difficulty swallowing, decreased level of alertness/lethargy, or any other alarming symptoms

## 2011-11-24 NOTE — ED Provider Notes (Signed)
History     CSN: [id number]  Arrival date & time 11/24/11  7829   First MD Initiated Contact with Patient 11/24/11 0745      Chief Complaint  Patient presents with  . Oral Swelling    (Consider location/radiation/quality/duration/timing/severity/associated sxs/prior treatment) HPI Pt presents with c/o sore throat and swollen uvula.  Pt states that she had mild nasal congestion and cough yesterday, then this morning woke up feeling that her throat was swollen.  She looked in her mouth and saw her uvula swollen.  No fever, no neck pain.  States she is having difficulty breathing, but when asked further she states that her throat feels swollen and this feels different to her.  No stridor, no difficulty breathing in and out.  She has not had any treatment for symptoms prior to arrival.  There are no other associated systemic symptoms,  No drooling.    Past Medical History  Diagnosis Date  . Migraine   . Hypertension   . Thyroid disease   . Depression   . GERD (gastroesophageal reflux disease)   . Asthma     Past Surgical History  Procedure Date  . Cholecystectomy   . Hysteroscopy   . Carpal tunnel release   . Ankle surgery     No family history on file.  History  Substance Use Topics  . Smoking status: Never Smoker   . Smokeless tobacco: Not on file  . Alcohol Use: No    OB History    Grav Para Term Preterm Abortions TAB SAB Ect Mult Living                  Review of Systems ROS reviewed and all otherwise negative except for mentioned in HPI  Allergies  Hydromorphone hcl; Meperidine hcl; Nitrofurantoin; and Tetracycline  Home Medications   Current Outpatient Rx  Name Route Sig Dispense Refill  . AMLODIPINE BESYLATE 10 MG PO TABS Oral Take 10 mg by mouth daily.    Marland Kitchen HYDROCODONE-ACETAMINOPHEN 5-500 MG PO TABS Oral Take 1 tablet by mouth every 6 (six) hours as needed. pain    . LEVOTHYROXINE SODIUM 175 MCG PO TABS Oral Take 175 mcg by mouth daily.    Marland Kitchen  LISINOPRIL-HYDROCHLOROTHIAZIDE 20-25 MG PO TABS Oral Take 1 tablet by mouth daily.    . SERTRALINE HCL 100 MG PO TABS Oral Take 100 mg by mouth at bedtime.      BP 131/61  Pulse 74  Temp 97.9 F (36.6 C) (Oral)  Resp 20  SpO2 95% Vitals reviewed Physical Exam Physical Examination: General appearance - alert, well appearing, and in no distress Mental status - alert, oriented to person, place, and time Eyes - pupils equal and reactive, no conjunctival injection, no scleral icterus Mouth - MMM, posterior OP with mild erythema, palate symmetric, no exudate, uvula enlarged with mild erythema, no drooling or difficulty with swallowing, no trismus Neck - supple, no significant adenopathy Chest - clear to auscultation, no wheezes, rales or rhonchi, symmetric air entry, no stridor or increased respiratory effort Heart - normal rate, regular rhythm, normal S1, S2, no murmurs, rubs, clicks or gallops Abdomen - soft, nontender, nondistended, no masses or organomegaly Extremities - peripheral pulses normal, no pedal edema, no clubbing or cyanosis Skin - normal coloration and turgor, no rashes  ED Course  Procedures (including critical care time)  Labs Reviewed  BASIC METABOLIC PANEL - Abnormal; Notable for the following:    Glucose, Bld 105 (*)  Creatinine, Ser 0.47 (*)     All other components within normal limits  RAPID STREP SCREEN  CBC  LAB REPORT - SCANNED   Ct Soft Tissue Neck W Contrast  11/24/2011  *RADIOLOGY REPORT*  Clinical Data: Swollen mouth (redness of throat with swollen uvula).  Difficulty breathing.  Fever.  CT NECK WITH CONTRAST  Technique:  Multidetector CT imaging of the neck was performed with intravenous contrast.  Contrast: 75mL OMNIPAQUE IOHEXOL 300 MG/ML  SOLN  Comparison: None.  Findings: Symmetric slightly prominent appearance of the palatine tonsils. Mild prominence of the soft palate/uvula.  This may be normal versus result of inflammation given the provided  history.  Increased number of normal to slightly enlarged lymph nodes throughout the neck, possibly reactive in origin.  No discrete primary mass identified.  No inflammation of the parapharyngeal space.  Minimal thyroid tissue.  Advanced atherosclerotic type changes for the patient's age with calcified aorta and carotid bifurcation.  No obvious high-grade stenosis with narrowing of the internal carotid artery greater on the right.  Cervical spondylotic changes most notable C6-7.  Lung apices clear.  Exophthalmos.  Mastoid air cells, middle ear cavities and visualized sinuses are clear.  Visualized intracranial structures unremarkable.  IMPRESSION: Symmetric slightly prominent appearance of the palatine tonsils. Mild prominence of the soft palate/uvula.  This may be normal versus result of inflammation given the provided history.  Increased number of normal to slightly enlarged lymph nodes throughout the neck, possibly reactive in origin.  No discrete primary mass identified.  No inflammation of the parapharyngeal space.  Minimal thyroid tissue.  Advanced atherosclerotic type changes for the patient's age.  Cervical spondylotic changes most notable C6-7.  Original Report Authenticated By: Fuller Canada, M.D.     1. Uvulitis       MDM  Pt presents with c/o swollen uvula, appearance c/w uvulitis- she also has mild sore throat.  No lip or tongue swelling- I doubt angioedema given the infectious symptoms.  Ct scan obtained to r/o RP, epigottitis or other airway compromising issue.  Pt is breathing easily in ED with normal vital signs.  Discharged with strict return precuations, she is agreeable with this plan.         Ethelda Chick, MD 11/25/11 [phone]

## 2012-01-24 ENCOUNTER — Emergency Department (HOSPITAL_BASED_OUTPATIENT_CLINIC_OR_DEPARTMENT_OTHER)
Admission: EM | Admit: 2012-01-24 | Discharge: 2012-01-24 | Disposition: A | Discharge status: Home / Self Care | Payer: Worker's Compensation | Attending: Emergency Medicine | Admitting: Emergency Medicine

## 2012-01-24 ENCOUNTER — Encounter (HOSPITAL_BASED_OUTPATIENT_CLINIC_OR_DEPARTMENT_OTHER): Payer: Self-pay | Admitting: Emergency Medicine

## 2012-01-24 DIAGNOSIS — Z888 Allergy status to other drugs, medicaments and biological substances status: Secondary | ICD-10-CM | POA: Insufficient documentation

## 2012-01-24 DIAGNOSIS — K219 Gastro-esophageal reflux disease without esophagitis: Secondary | ICD-10-CM | POA: Insufficient documentation

## 2012-01-24 DIAGNOSIS — F3289 Other specified depressive episodes: Secondary | ICD-10-CM | POA: Insufficient documentation

## 2012-01-24 DIAGNOSIS — G43909 Migraine, unspecified, not intractable, without status migrainosus: Secondary | ICD-10-CM | POA: Insufficient documentation

## 2012-01-24 DIAGNOSIS — F17200 Nicotine dependence, unspecified, uncomplicated: Secondary | ICD-10-CM | POA: Insufficient documentation

## 2012-01-24 DIAGNOSIS — E079 Disorder of thyroid, unspecified: Secondary | ICD-10-CM | POA: Insufficient documentation

## 2012-01-24 DIAGNOSIS — I10 Essential (primary) hypertension: Secondary | ICD-10-CM | POA: Insufficient documentation

## 2012-01-24 DIAGNOSIS — F329 Major depressive disorder, single episode, unspecified: Secondary | ICD-10-CM | POA: Insufficient documentation

## 2012-01-24 DIAGNOSIS — J45909 Unspecified asthma, uncomplicated: Secondary | ICD-10-CM | POA: Insufficient documentation

## 2012-01-24 MED ORDER — ALBUTEROL SULFATE HFA 108 (90 BASE) MCG/ACT IN AERS: 2.0000 | INHALATION_SPRAY | Freq: Four times a day (QID) | RESPIRATORY_TRACT | Status: DC | PRN

## 2012-01-24 MED ORDER — ALBUTEROL SULFATE HFA 108 (90 BASE) MCG/ACT IN AERS
2.0000 | INHALATION_SPRAY | Freq: Once | RESPIRATORY_TRACT | Status: AC
  Administered 2012-01-24: 2 via RESPIRATORY_TRACT
  Filled 2012-01-24: qty 6.7

## 2012-01-24 MED ORDER — ALBUTEROL SULFATE (5 MG/ML) 0.5% IN NEBU
INHALATION_SOLUTION | RESPIRATORY_TRACT | Status: AC
  Administered 2012-01-24: 15:00:00
  Filled 2012-01-24: qty 1

## 2012-01-24 NOTE — ED Provider Notes (Signed)
History     CSN: [id number]  Arrival date & time 01/24/12  1440   First MD Initiated Contact with Patient 01/24/12 1458     CC:  SOB  HPI:  This is a 51 year old woman with asthma presenting with cough and dyspnea after being exposed to a flea spray at work.  While at work about 1 hour ago, a Radio broadcast assistant sprayed a flea spray containing nylar, tetramethrin, and sumithrin about 5 feet from where she was working.  She inhaled some of this accidentally and started coughing and having shortness of breath.  911 was called and EMS administered nebulized albuterol and brought her here.  The patients states the albuterol did help and her symptoms are improved since the inciting event.  She also endorses some nausea, headaches, and chest pain; all occuring since the exposure.  She denies vomiting, dizziness, and abdominal pain.  She has been prescribed an albuterol inhaler in the past for asthma.  She has run out of this, but in the past used it about twice a week.  She smokes cigarettes, but will be switching to an electronic cigarette soon.  History: Past Medical History  Diagnosis Date  . Migraine   . Hypertension   . Thyroid disease   . Depression   . GERD (gastroesophageal reflux disease)   . Asthma    Past Surgical History  Procedure Date  . Cholecystectomy   . Hysteroscopy   . Carpal tunnel release   . Ankle surgery    No family history on file.  History  Substance Use Topics  . Smoking status: Current Everyday Smoker  . Smokeless tobacco: Not on file  . Alcohol Use: No   Review of Systems  All other systems reviewed and are negative.    Allergies  Hydromorphone hcl; Meperidine hcl; Nitrofurantoin; and Tetracycline  Home Medications   Current Outpatient Rx  Name Route Sig Dispense Refill  . AMLODIPINE BESYLATE 10 MG PO TABS Oral Take 10 mg by mouth daily.    Marland Kitchen HYDROCODONE-ACETAMINOPHEN 5-500 MG PO TABS Oral Take 1 tablet by mouth every 6 (six) hours as needed. pain    .  LEVOTHYROXINE SODIUM 175 MCG PO TABS Oral Take 175 mcg by mouth daily.    Marland Kitchen LISINOPRIL-HYDROCHLOROTHIAZIDE 20-25 MG PO TABS Oral Take 1 tablet by mouth daily.    . SERTRALINE HCL 100 MG PO TABS Oral Take 100 mg by mouth at bedtime.     Vitals: BP 122/62  Pulse 81  Temp 98.4 F (36.9 C) (Oral)  Resp 16  SpO2 94%  Physical Exam  Constitutional: She appears well-developed and well-nourished. She does not appear ill. No distress.  HENT:  Mouth/Throat: Uvula is midline, oropharynx is clear and moist and mucous membranes are normal.  Cardiovascular: Normal rate, regular rhythm and normal heart sounds.   No murmur heard. Pulmonary/Chest: Effort normal and breath sounds normal.  Abdominal: Soft. Bowel sounds are normal. There is no tenderness.  Skin: Skin is warm, dry and intact.    ED Course  Procedures (including critical care time)  3:21 PM - Albuterol inhaler ordered.  3:30 PM - Pt reassessed.  She is ambulating in the hall way in no apparent distress.  Pulmonary exam is normal and unchanged.  Ready for discharge.  Labs Reviewed - No data to display No results found.   1. Toxic effect of unspecified gas, fume, or vapor       MDM  1.   Toxic inhalation:  Irritation of lower airways from inhalation of flea spray.  Patients symptoms were improving and exam was normal, so imaging not indicated.  Patient has a history of reactive airway disease and she has run out of her albuterol inhaler.  We will provider her with this.  She has instructions to return to the ED if symptoms worsen.  She can return to work tomorrow.  Lollie Sails, MD 01/24/12 [phone]

## 2012-01-24 NOTE — ED Notes (Signed)
Pt to ED via EMS w/ c/o [facility] & wheezing after being exposed to flea spray at work

## 2012-01-24 NOTE — ED Notes (Signed)
Pt amb to [facility]

## 2012-01-24 NOTE — ED Provider Notes (Signed)
I saw and evaluated the patient, reviewed the resident's note and I agree with the findings and plan.   .Face to face Exam:  General:  Awake HEENT:  Atraumatic Resp:  Normal effort Abd:  Nondistended Neuro:No focal weakness Lymph: No adenopathy   Nelia Shi, MD 01/24/12 1730

## 2013-01-02 ENCOUNTER — Other Ambulatory Visit: Payer: Self-pay

## 2013-01-02 DIAGNOSIS — Z1231 Encounter for screening mammogram for malignant neoplasm of breast: Secondary | ICD-10-CM

## 2013-01-23 ENCOUNTER — Ambulatory Visit: Payer: Self-pay

## 2013-02-13 ENCOUNTER — Ambulatory Visit
Admission: RE | Admit: 2013-02-13 | Discharge: 2013-02-13 | Disposition: A | Discharge status: Home / Self Care | Payer: Managed Care, Other (non HMO) | Source: Ambulatory Visit

## 2013-02-13 DIAGNOSIS — Z1231 Encounter for screening mammogram for malignant neoplasm of breast: Secondary | ICD-10-CM

## 2013-05-23 ENCOUNTER — Encounter (HOSPITAL_COMMUNITY): Payer: Self-pay | Admitting: Emergency Medicine

## 2013-05-23 ENCOUNTER — Emergency Department (HOSPITAL_COMMUNITY)
Admission: EM | Admit: 2013-05-23 | Discharge: 2013-05-23 | Disposition: A | Discharge status: Home / Self Care | Payer: Managed Care, Other (non HMO) | Attending: Emergency Medicine | Admitting: Emergency Medicine

## 2013-05-23 ENCOUNTER — Emergency Department (HOSPITAL_COMMUNITY): Payer: Managed Care, Other (non HMO)

## 2013-05-23 DIAGNOSIS — R109 Unspecified abdominal pain: Secondary | ICD-10-CM

## 2013-05-23 DIAGNOSIS — Z8739 Personal history of other diseases of the musculoskeletal system and connective tissue: Secondary | ICD-10-CM | POA: Insufficient documentation

## 2013-05-23 DIAGNOSIS — R61 Generalized hyperhidrosis: Secondary | ICD-10-CM | POA: Insufficient documentation

## 2013-05-23 DIAGNOSIS — E669 Obesity, unspecified: Secondary | ICD-10-CM | POA: Insufficient documentation

## 2013-05-23 DIAGNOSIS — K219 Gastro-esophageal reflux disease without esophagitis: Secondary | ICD-10-CM | POA: Insufficient documentation

## 2013-05-23 DIAGNOSIS — J45909 Unspecified asthma, uncomplicated: Secondary | ICD-10-CM | POA: Insufficient documentation

## 2013-05-23 DIAGNOSIS — IMO0002 Reserved for concepts with insufficient information to code with codable children: Secondary | ICD-10-CM | POA: Insufficient documentation

## 2013-05-23 DIAGNOSIS — E079 Disorder of thyroid, unspecified: Secondary | ICD-10-CM | POA: Insufficient documentation

## 2013-05-23 DIAGNOSIS — L539 Erythematous condition, unspecified: Secondary | ICD-10-CM | POA: Insufficient documentation

## 2013-05-23 DIAGNOSIS — E785 Hyperlipidemia, unspecified: Secondary | ICD-10-CM | POA: Insufficient documentation

## 2013-05-23 DIAGNOSIS — Z8669 Personal history of other diseases of the nervous system and sense organs: Secondary | ICD-10-CM | POA: Insufficient documentation

## 2013-05-23 DIAGNOSIS — R0789 Other chest pain: Secondary | ICD-10-CM | POA: Insufficient documentation

## 2013-05-23 DIAGNOSIS — R55 Syncope and collapse: Secondary | ICD-10-CM | POA: Insufficient documentation

## 2013-05-23 DIAGNOSIS — I10 Essential (primary) hypertension: Secondary | ICD-10-CM | POA: Insufficient documentation

## 2013-05-23 DIAGNOSIS — F3289 Other specified depressive episodes: Secondary | ICD-10-CM | POA: Insufficient documentation

## 2013-05-23 DIAGNOSIS — R1011 Right upper quadrant pain: Secondary | ICD-10-CM | POA: Insufficient documentation

## 2013-05-23 DIAGNOSIS — R1013 Epigastric pain: Secondary | ICD-10-CM | POA: Insufficient documentation

## 2013-05-23 DIAGNOSIS — Z79899 Other long term (current) drug therapy: Secondary | ICD-10-CM | POA: Insufficient documentation

## 2013-05-23 DIAGNOSIS — R609 Edema, unspecified: Secondary | ICD-10-CM | POA: Insufficient documentation

## 2013-05-23 DIAGNOSIS — F17200 Nicotine dependence, unspecified, uncomplicated: Secondary | ICD-10-CM | POA: Insufficient documentation

## 2013-05-23 DIAGNOSIS — F329 Major depressive disorder, single episode, unspecified: Secondary | ICD-10-CM | POA: Insufficient documentation

## 2013-05-23 HISTORY — DX: Sjogren syndrome, unspecified: M35.00

## 2013-05-23 LAB — CBC WITH DIFFERENTIAL/PLATELET
Eosinophils Relative: 2 % (ref 0–5)
HCT: 42.6 % (ref 36.0–46.0)
Lymphocytes Relative: 21 % (ref 12–46)
Lymphs Abs: 2.7 10*3/uL (ref 0.7–4.0)
MCV: 88.2 fL (ref 78.0–100.0)
Neutro Abs: 8.7 10*3/uL — ABNORMAL HIGH (ref 1.7–7.7)
Platelets: 275 10*3/uL (ref 150–400)
RBC: 4.83 MIL/uL (ref 3.87–5.11)
WBC: 12.7 10*3/uL — ABNORMAL HIGH (ref 4.0–10.5)

## 2013-05-23 LAB — COMPREHENSIVE METABOLIC PANEL
ALT: 24 U/L (ref 0–35)
Alkaline Phosphatase: 101 U/L (ref 39–117)
CO2: 27 mEq/L (ref 19–32)
Calcium: 9.3 mg/dL (ref 8.4–10.5)
Chloride: 102 mEq/L (ref 96–112)
GFR calc Af Amer: 85 mL/min — ABNORMAL LOW (ref >90)
GFR calc non Af Amer: 73 mL/min — ABNORMAL LOW (ref >90)
Glucose, Bld: 134 mg/dL — ABNORMAL HIGH (ref 70–99)
Sodium: 139 mEq/L (ref 135–145)
Total Bilirubin: 0.4 mg/dL (ref 0.3–1.2)

## 2013-05-23 LAB — POCT I-STAT TROPONIN I
Troponin i, poc: 0.01 ng/mL (ref 0.00–0.08)
Troponin i, poc: 0.03 ng/mL (ref 0.00–0.08)

## 2013-05-23 MED ORDER — ONDANSETRON HCL 4 MG/2ML IJ SOLN
4.0000 mg | Freq: Once | INTRAMUSCULAR | Status: AC
  Administered 2013-05-23: 4 mg via INTRAVENOUS
  Filled 2013-05-23: qty 2

## 2013-05-23 MED ORDER — ASPIRIN 81 MG PO CHEW
324.0000 mg | CHEWABLE_TABLET | Freq: Once | ORAL | Status: AC
  Administered 2013-05-23: 324 mg via ORAL
  Filled 2013-05-23: qty 4

## 2013-05-23 MED ORDER — MORPHINE SULFATE 4 MG/ML IJ SOLN
4.0000 mg | Freq: Once | INTRAMUSCULAR | Status: AC
  Administered 2013-05-23: 4 mg via INTRAVENOUS
  Filled 2013-05-23: qty 1

## 2013-05-23 MED ORDER — SODIUM CHLORIDE 0.9 % IV BOLUS (SEPSIS)
1000.0000 mL | Freq: Once | INTRAVENOUS | Status: AC
  Administered 2013-05-23: 1000 mL via INTRAVENOUS

## 2013-05-23 MED ORDER — IOHEXOL 300 MG/ML  SOLN
100.0000 mL | Freq: Once | INTRAMUSCULAR | Status: AC | PRN
  Administered 2013-05-23: 100 mL via INTRAVENOUS

## 2013-05-23 NOTE — ED Provider Notes (Signed)
CSN: [id number]     Arrival date & time 05/23/13  1631 History   First MD Initiated Contact with Patient 05/23/13 1634     Chief Complaint  Patient presents with  . Loss of Consciousness   (Consider location/radiation/quality/duration/timing/severity/associated sxs/prior Treatment) HPI  This a 52 year old female with history of hypertension, hyperlipidemia who presents with syncope. Patient was at a Christmas parade earlier today when she had a syncopal event. Patient states that she's been standing for long time and then she felt faint. Patient states he has not felt well for the last several days and reports right upper quadrant and epigastric pain. She denies any nausea or vomiting.  She states that she generally has not felt well and feels "blah." She has no prior episodes of syncope.  Patient also reports 4/10 chest pain. She reports it as pressure over the anterior sternum. She has early family history of heart disease.  Past Medical History  Diagnosis Date  . Migraine   . Hypertension   . Thyroid disease   . Depression   . GERD (gastroesophageal reflux disease)   . Asthma   . Sjogren's disease    Past Surgical History  Procedure Laterality Date  . Cholecystectomy    . Hysteroscopy    . Carpal tunnel release    . Ankle surgery     Family History  Problem Relation Age of Onset  . Heart failure Father    History  Substance Use Topics  . Smoking status: Current Every Day Smoker    Types: Cigarettes  . Smokeless tobacco: Not on file  . Alcohol Use: No   OB History   Grav Para Term Preterm Abortions TAB SAB Ect Mult Living                 Review of Systems  Constitutional: Negative for fever.  Respiratory: Positive for chest tightness. Negative for cough and shortness of breath.   Cardiovascular: Positive for chest pain.  Gastrointestinal: Positive for abdominal pain. Negative for nausea and vomiting.  Genitourinary: Negative for dysuria.  Musculoskeletal:  Negative for back pain.  Skin: Negative for wound.  Neurological: Positive for syncope. Negative for headaches.  Psychiatric/Behavioral: Negative for confusion.  All other systems reviewed and are negative.    Allergies  Hydromorphone hcl; Macrodantin; Meperidine hcl; Tetracyclines & related; Nitrofurantoin; and Tetracycline  Home Medications   Current Outpatient Rx  Name  Route  Sig  Dispense  Refill  . acetaminophen (TYLENOL) 500 MG tablet   Oral   Take 1,000 mg by mouth every 6 (six) hours as needed. For pain         . Conj Estrog-Medroxyprogest Ace (PREMPRO PO)   Oral   Take 1 tablet by mouth daily. Pt. Not sure of strength         . Dexlansoprazole (DEXILANT PO)   Oral   Take 1 capsule by mouth daily. Patient not sure of strength but says its a blue capsule         . fluticasone (FLONASE) 50 MCG/ACT nasal spray   Each Nare   Place 1 spray into both nostrils daily.         Marland Kitchen levothyroxine (SYNTHROID, LEVOTHROID) 200 MCG tablet   Oral   Take 200 mcg by mouth daily before breakfast.         . lisinopril-hydrochlorothiazide (PRINZIDE,ZESTORETIC) 20-25 MG per tablet   Oral   Take 1 tablet by mouth daily.         Marland Kitchen  metoCLOPramide (REGLAN) 10 MG tablet   Oral   Take 10 mg by mouth at bedtime.         . rosuvastatin (CRESTOR) 40 MG tablet   Oral   Take 40 mg by mouth at bedtime.         . sertraline (ZOLOFT) 100 MG tablet   Oral   Take 100 mg by mouth at bedtime.         Marland Kitchen EXPIRED: albuterol (PROVENTIL HFA;VENTOLIN HFA) 108 (90 BASE) MCG/ACT inhaler   Inhalation   Inhale 2 puffs into the lungs every 6 (six) hours as needed for wheezing.   1 Inhaler   2    BP 119/87  Pulse 78  Temp(Src) 98 F (36.7 C) (Oral)  Resp 22  SpO2 96% Physical Exam  Nursing note and vitals reviewed. Constitutional: She is oriented to person, place, and time. No distress.  Obese, diaphoretic  HENT:  Head: Normocephalic and atraumatic.  Eyes: Pupils are equal,  round, and reactive to light.  Neck: Neck supple.  Cardiovascular: Normal rate, regular rhythm and normal heart sounds.   No murmur heard. Pulmonary/Chest: Effort normal and breath sounds normal. No respiratory distress. She has no wheezes. She exhibits tenderness.  Abdominal: Soft. Bowel sounds are normal. There is no tenderness. There is no rebound and no guarding.  Musculoskeletal:  Trace bilateral lower extremity edema  Neurological: She is alert and oriented to person, place, and time.  Skin: Skin is warm and dry.  Erythema of the bilateral shins, blanching  Psychiatric: She has a normal mood and affect.    ED Course  Procedures (including critical care time) Labs Review Labs Reviewed  CBC WITH DIFFERENTIAL - Abnormal; Notable for the following:    WBC 12.7 (*)    Neutro Abs 8.7 (*)    Monocytes Absolute 1.1 (*)    All other components within normal limits  COMPREHENSIVE METABOLIC PANEL - Abnormal; Notable for the following:    Glucose, Bld 134 (*)    GFR calc non Af Amer 73 (*)    GFR calc Af Amer 85 (*)    All other components within normal limits  GLUCOSE, CAPILLARY - Abnormal; Notable for the following:    Glucose-Capillary 132 (*)    All other components within normal limits  LIPASE, BLOOD  D-DIMER, QUANTITATIVE  POCT I-STAT TROPONIN I   Imaging Review Dg Chest 2 View  05/23/2013   CLINICAL DATA:  Syncope.  Chest pain.  EXAM: CHEST  2 VIEW  COMPARISON:  09/25/2012  FINDINGS: Low lung volumes noted. The heart size and mediastinal contours are within normal limits. Both lungs are clear. The visualized skeletal structures are unremarkable.  IMPRESSION: No active cardiopulmonary disease.   Electronically Signed   By: Myles Rosenthal M.D.   On: 05/23/2013 18:12    EKG Interpretation    Date/Time:  Saturday May 23 2013 16:52:05 EST Ventricular Rate:  84 PR Interval:  173 QRS Duration: 107 QT Interval:  381 QTC Calculation: 450 R Axis:   12 Text Interpretation:   Sinus rhythm No significant change was found Confirmed by Shakeeta Godette  MD, Masaki Rothbauer ([id number]) on 05/23/2013 4:59:13 PM            MDM  No diagnosis found.   Patient presents with syncope. She reports recent history of right upper quadrant pain as well as currently chest pain. Patient was given aspirin, morphine for her pain. Basic labwork was obtained and is negative. EKG is reassuring. Patient  did not appear to be orthostatic. CT scan of the abdomen is pending. A CT scan is negative, patient can likely be discharged home with followup with her cardiologist. TIMI risk score is 1 and she has an established cardiologist.    Shon Baton, MD 05/24/13 [phone]

## 2013-05-23 NOTE — ED Notes (Signed)
Per EMS, pt had syncopal episode with loss of consciousness lasting appx 2 mins at 1500. Witnessed fall, pt caught by bystander and lowered to the ground. Per EMS, pt did not hit her head. Per EMS, pt has been having epigastric/abd pain radiating to the right lower quadrant x2days.

## 2013-05-23 NOTE — ED Provider Notes (Signed)
CT scan reveals no pathology to explain her pain.  Her second troponin is negative.  The patient.  Wants to go home.  She states she will followup with her cardiologist, for her syncopal event, today  Arman Filter, NP 05/23/13 2052

## 2013-05-24 NOTE — ED Provider Notes (Signed)
Medical screening examination/treatment/procedure(s) were performed by non-physician practitioner and as supervising physician I was immediately available for consultation/collaboration.  EKG Interpretation    Date/Time:  Saturday May 23 2013 16:52:05 EST Ventricular Rate:  84 PR Interval:  173 QRS Duration: 107 QT Interval:  381 QTC Calculation: 450 R Axis:   12 Text Interpretation:  Sinus rhythm No significant change was found Confirmed by Wilkie Aye  MD, COURTNEY ([id number]) on 05/23/2013 4:59:13 PM             Shon Baton, MD 05/24/13 1105

## 2014-03-10 ENCOUNTER — Encounter: Payer: Self-pay | Admitting: Physician Assistant

## 2014-03-16 ENCOUNTER — Other Ambulatory Visit (INDEPENDENT_AMBULATORY_CARE_PROVIDER_SITE_OTHER): Payer: BC Managed Care – PPO

## 2014-03-16 ENCOUNTER — Ambulatory Visit (INDEPENDENT_AMBULATORY_CARE_PROVIDER_SITE_OTHER): Payer: BC Managed Care – PPO | Admitting: Physician Assistant

## 2014-03-16 ENCOUNTER — Encounter: Payer: Self-pay | Admitting: Physician Assistant

## 2014-03-16 VITALS — BP 120/82 | HR 72 | Ht 68.0 in | Wt 319.8 lb

## 2014-03-16 DIAGNOSIS — R1031 Right lower quadrant pain: Secondary | ICD-10-CM

## 2014-03-16 DIAGNOSIS — R197 Diarrhea, unspecified: Secondary | ICD-10-CM

## 2014-03-16 LAB — CBC WITH DIFFERENTIAL/PLATELET
BASOS ABS: 0 10*3/uL (ref 0.0–0.1)
BASOS PCT: 0.3 % (ref 0.0–3.0)
EOS ABS: 0.3 10*3/uL (ref 0.0–0.7)
Eosinophils Relative: 3.2 % (ref 0.0–5.0)
HCT: 40.7 % (ref 36.0–46.0)
Hemoglobin: 13.5 g/dL (ref 12.0–15.0)
Lymphocytes Relative: 29.3 % (ref 12.0–46.0)
Lymphs Abs: 2.7 10*3/uL (ref 0.7–4.0)
MCHC: 33.2 g/dL (ref 30.0–36.0)
MCV: 86.8 fl (ref 78.0–100.0)
MONO ABS: 0.8 10*3/uL (ref 0.1–1.0)
Monocytes Relative: 8.9 % (ref 3.0–12.0)
NEUTROS PCT: 58.3 % (ref 43.0–77.0)
Neutro Abs: 5.3 10*3/uL (ref 1.4–7.7)
Platelets: 266 10*3/uL (ref 150.0–400.0)
RBC: 4.69 Mil/uL (ref 3.87–5.11)
RDW: 14.3 % (ref 11.5–15.5)
WBC: 9 10*3/uL (ref 4.0–10.5)

## 2014-03-16 LAB — HIGH SENSITIVITY CRP: CRP, High Sensitivity: 25.98 mg/L — ABNORMAL HIGH (ref 0.000–5.000)

## 2014-03-16 MED ORDER — GLYCOPYRROLATE 2 MG PO TABS: 2.0000 mg | ORAL_TABLET | Freq: Two times a day (BID) | ORAL | Status: DC

## 2014-03-16 MED ORDER — MOVIPREP 100 G PO SOLR: 1.0000 | ORAL | Status: DC

## 2014-03-16 NOTE — Progress Notes (Signed)
Agree with initial assessment and plans as outlined 

## 2014-03-16 NOTE — Progress Notes (Signed)
Subjective:    Patient ID: Marie Chen, female    DOB: [birth year], 53 y.o.   MRN: [id number]  HPI  Marie Chen  is a pleasant 53 year old white female known to Dr. Marina Goodell from previous procedures done in 2009. She had endoscopy and colonoscopy done at that time both of which were normal. She does have history of what is felt to be a chronic functional abdominal pain IBS and GERD. Other medical problems include adult onset diabetes mellitus, hypothyroidism, hyperlipidemia, depression, sleep apnea, and morbid obesity. She says she has had problems with IBS and diarrhea for many years but that her problems have been much worse over the past several months. She is having right lower quadrant pain on a daily basis and says she gets a sharp had crampy type pain in her right lower quadrant with bowel movements. She says she had an appendicolith at 1 point which she passed. Generally she's having 3-4 bowel movements per day some days several more. She's been taking Imodium fairly regularly. Her episodes of diarrhea are often accompanied by urgency and incontinence. 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 She doesn't generally have any nocturnal problems with diarrhea. Her appetite has been fine her weight has been stable. She's not aware of any food intolerances though has wondered about lactose. She has not noted any melena or hematochezia. Family history is negative for colon cancer polyps she does have a daughter who was diagnosed with celiac disease. She is status post cholecystectomy and bilateral tubal ligation. She says she started having problems with diarrhea intermittently and IBS prior to the cholecystectomy which was in 2000.    Review of Systems  Constitutional: Negative.   HENT: Negative.   Eyes: Negative.   Respiratory: Negative.   Cardiovascular: Negative.   Gastrointestinal: Positive  for abdominal pain and diarrhea.  Endocrine: Negative.   Genitourinary: Negative.   Musculoskeletal: Negative.   Skin: Negative.   Allergic/Immunologic: Negative.   Neurological: Negative.   Hematological: Negative.   Psychiatric/Behavioral: Negative.    Outpatient Prescriptions Prior to Visit  Medication Sig Dispense Refill  . acetaminophen (TYLENOL) 500 MG tablet Take 1,000 mg by mouth every 6 (six) hours as needed. For pain      . Dexlansoprazole (DEXILANT PO) Take 1 capsule by mouth daily. Patient not sure of strength but says its a blue capsule      . fluticasone (FLONASE) 50 MCG/ACT nasal spray Place 1 spray into both nostrils daily.      Marland Kitchen levothyroxine (SYNTHROID, LEVOTHROID) 200 MCG tablet Take 200 mcg by mouth daily before breakfast.      . lisinopril-hydrochlorothiazide (PRINZIDE,ZESTORETIC) 20-25 MG per tablet Take 1 tablet by mouth daily.      . metoCLOPramide (REGLAN) 10 MG tablet Take 10 mg by mouth at bedtime.      . rosuvastatin (CRESTOR) 40 MG tablet Take 40 mg by mouth at bedtime.      Marland Kitchen albuterol (PROVENTIL HFA;VENTOLIN HFA) 108 (90 BASE) MCG/ACT inhaler Inhale 2 puffs into the lungs every 6 (six) hours as needed for wheezing.  1 Inhaler  2  . Conj Estrog-Medroxyprogest Ace (PREMPRO PO) Take 1 tablet by mouth daily. Pt. Not sure of strength      . sertraline (ZOLOFT) 100 MG tablet Take 100 mg by mouth at bedtime.       No facility-administered medications prior to  visit.   Allergies  Allergen Reactions  . Hydromorphone Hcl Nausea And Vomiting  . Macrodantin [Nitrofurantoin Macrocrystal]   . Meperidine Hcl Nausea Only    Is ok if given with Phenergan  . Tetracyclines & Related Rash   Patient Active Problem List   Diagnosis Date Noted  . ACUTE PHARYNGITIS 05/23/2009  . SINUSITIS- ACUTE-NOS 02/24/2009  . HEADACHE 02/24/2009  . ASTHMATIC BRONCHITIS, ACUTE 04/27/2008  . DYSPHAGIA 04/27/2008  . MIGRAINE, CHRONIC 04/22/2008  . BRONCHITIS, CHRONIC 04/22/2008  .  PERSONAL HISTORY ENDOCRN METABOLIC&IMMUNITY D/O 04/22/2008  . DIABETES MELLITUS, TYPE II 03/26/2007  . 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 ALLERGIC RHINITIS 03/26/2007  . ASTHMA NOS W/ACUTE EXACERBATION 03/26/2007  . PEPTIC ULCER DISEASE 03/26/2007  . RESTLESS LEG SYNDROME, HX OF 03/26/2007  . IRRITABLE BOWEL SYNDROME, HX OF 03/26/2007  . HYPOTHYROIDISM 01/05/2007  . HYPERLIPIDEMIA 01/05/2007  . DEPRESSION 01/05/2007  . SLEEP APNEA, OBSTRUCTIVE 01/05/2007  . HYPERTENSION 01/05/2007  . GERD 01/05/2007  . HAMMER TOE 01/05/2007   History  Substance Use Topics  . Smoking status: Former Smoker    Types: Cigarettes  . Smokeless tobacco: Never Used  . Alcohol Use: No   family history includes Heart failure in her father.     Objective:   Physical Exam  well-developed obese white female in no acute distress, pleasant blood pressure 120/82 pulse 72 height 5 foot 8 weight 319 BMI of 48.6. 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 This may be all diarrhea predominant IBS, rule out component of bacterial overgrowth, rule out underlying celiac disease, especially in light of daughter with celiac disease, rule out microscopic colitis.occult lesion. #2 morbid obesity #3 adult onset diabetes mellitus #4 hypothyroidism #5 sleep apnea #6  hypertension #7 GERD #8 hyperlipidemia  Plan; check CBC with differential, CRP and celiac panel Start trial of Robinul Forte 2 mg by mouth twice daily Empiric course of Xifaxan 550 twice daily x10 days for probable bacterial overgrowth Schedule for colonoscopy with Dr. Marina GoodellPerry with random biopsies. Procedure discussed in detail with the patient and she is agreeable to proceed

## 2014-03-16 NOTE — Patient Instructions (Addendum)
Please go to the basement level to have your labs drawn.  We sent prescriptions to [place]. 1. Glycopyrolate ( Robinul Forte)  2. Moviprep  We are sending the Xifaxan antibiotic request to a company we work with, [employer]. They will call your insurance company and they will have the medication mailed to your home.  You have been scheduled for a colonoscopy. Please follow written instructions given to you at your visit today.  Please pick up your prep kit at the pharmacy within the next 1-3 days. Ulysses,. If you use inhalers (even only as needed), please bring them with you on the day of your procedure. Your physician has requested that you go to www.startemmi.com and enter the access code given to you at your visit today. This web site gives a general overview about your procedure. However, you should still follow specific instructions given to you by our office regarding your preparation for the procedure.

## 2014-03-17 LAB — CELIAC PANEL 10
ENDOMYSIAL SCREEN: NEGATIVE
GLIADIN IGA: 23.1 U/mL — AB (ref <20)
Gliadin IgG: 9.4 U/mL (ref <20)
IGA: 422 mg/dL — AB (ref 69–380)
Tissue Transglut Ab: 15.9 U/mL (ref <20)
Tissue Transglutaminase Ab, IgA: 11.2 U/mL (ref <20)

## 2014-03-22 ENCOUNTER — Encounter: Payer: Self-pay | Admitting: Internal Medicine

## 2014-04-06 ENCOUNTER — Encounter: Payer: Self-pay | Admitting: Internal Medicine

## 2014-04-06 ENCOUNTER — Ambulatory Visit (AMBULATORY_SURGERY_CENTER): Payer: BC Managed Care – PPO | Admitting: Internal Medicine

## 2014-04-06 VITALS — BP 128/69 | HR 70 | Temp 96.7°F | Resp 17 | Ht 68.0 in | Wt 317.0 lb

## 2014-04-06 DIAGNOSIS — R197 Diarrhea, unspecified: Secondary | ICD-10-CM

## 2014-04-06 DIAGNOSIS — R1031 Right lower quadrant pain: Secondary | ICD-10-CM

## 2014-04-06 LAB — GLUCOSE, CAPILLARY
Glucose-Capillary: 91 mg/dL (ref 70–99)
Glucose-Capillary: 87 mg/dL (ref 70–99)

## 2014-04-06 MED ORDER — SODIUM CHLORIDE 0.9 % IV SOLN: 500.0000 mL | INTRAVENOUS | Status: DC

## 2014-04-06 NOTE — Patient Instructions (Signed)
YOU HAD AN ENDOSCOPIC PROCEDURE TODAY AT THE [facility]: Refer to the procedure report that was given to you for any specific questions about what was found during the examination.  If the procedure report does not answer your questions, please call your gastroenterologist to clarify.  If you requested that your care partner not be given the details of your procedure findings, then the procedure report has been included in a sealed envelope for you to review at your convenience later.  YOU SHOULD EXPECT: Some feelings of bloating in the abdomen. Passage of more gas than usual.  Walking can help get rid of the air that was put into your GI tract during the procedure and reduce the bloating. If you had a lower endoscopy (such as a colonoscopy or flexible sigmoidoscopy) you may notice spotting of blood in your stool or on the toilet paper. If you underwent a bowel prep for your procedure, then you may not have a normal bowel movement for a few days.  DIET: Your first meal following the procedure should be a light meal and then it is ok to progress to your normal diet.  A half-sandwich or bowl of soup is an example of a good first meal.  Heavy or fried foods are harder to digest and may make you feel nauseous or bloated.  Likewise meals heavy in dairy and vegetables can cause extra gas to form and this can also increase the bloating.  Drink plenty of fluids but you should avoid alcoholic beverages for 24 hours.  ACTIVITY: Your care partner should take you home directly after the procedure.  You should plan to take it easy, moving slowly for the rest of the day.  You can resume normal activity the day after the procedure however you should NOT DRIVE or use heavy machinery for 24 hours (because of the sedation medicines used during the test).    SYMPTOMS TO REPORT IMMEDIATELY: A gastroenterologist can be reached at any hour.  During normal business hours, 8:30 AM to 5:00 PM Monday through Friday,  call [phone].  After hours and on weekends, please call the GI answering service at [phone] who will take a message and have the physician on call contact you.   Following lower endoscopy (colonoscopy or flexible sigmoidoscopy):  Excessive amounts of blood in the stool  Significant tenderness or worsening of abdominal pains  Swelling of the abdomen that is new, acute  Fever of 100F or higher      FOLLOW UP: If any biopsies were taken you will be contacted by phone or by letter within the next 1-3 weeks.  Call your gastroenterologist if you have not heard about the biopsies in 3 weeks.  Our staff will call the home number listed on your records the next business day following your procedure to check on you and address any questions or concerns that you may have at that time regarding the information given to you following your procedure. This is a courtesy call and so if there is no answer at the home number and we have not heard from you through the emergency physician on call, we will assume that you have returned to your regular daily activities without incident.  SIGNATURES/CONFIDENTIALITY: You and/or your care partner have signed paperwork which will be entered into your electronic medical record.  These signatures attest to the fact that that the information above on your After Visit Summary has been reviewed and is understood.  Full responsibility of   the confidentiality of this discharge information lies with you and/or your care-partner.   Resume medications. Information given on diverticulosis with discharge instructions.    

## 2014-04-06 NOTE — Op Note (Signed)
[facility] [address] [place], [id number]   COLONOSCOPY PROCEDURE REPORT  PATIENT: Marie CrockerStanfield, [name]  MR#: [id number] BIRTHDATE: February 10, 1961 , 53  yrs. old GENDER: female ENDOSCOPIST: Roxy CedarJohn N Perry Jr, MD REFERRED BY:.  Self / Office PROCEDURE DATE:  04/06/2014 PROCEDURE:   Colonoscopy with biopsy First Screening Colonoscopy - Avg.  risk and is 50 yrs.  old or older - No.  Prior Negative Screening - Now for repeat screening. N/A  History of Adenoma - Now for follow-up colonoscopy & has been > or = to 3 yrs.  N/A  Polyps Removed Today? No.  Polyps Removed Today? No.  Recommend repeat exam, <10 yrs? No. ASA CLASS:   Class II INDICATIONS:chronic diarrhea and abdominal pain in the lower right quadrant. MEDICATIONS: Monitored anesthesia care and Propofol 340 mg IV  DESCRIPTION OF PROCEDURE:   After the risks benefits and alternatives of the procedure were thoroughly explained, informed consent was obtained.  The digital rectal exam revealed no abnormalities of the rectum.   The LB [id number]  endoscope was introduced through the anus and advanced to the cecum, which was identified by both the appendix and ileocecal valve. No adverse events experienced.   The quality of the prep was excellent, using MoviPrep  The instrument was then slowly withdrawn as the colon was fully examined.    COLON FINDINGS: The examined terminal ileum appeared to be normal. There was moderate diverticulosis noted in the left colon.   The examination was otherwise normal. Multiple random colon biopsies taken.  Retroflexed views revealed no abnormalities. The time to cecum=1 minutes 52 seconds.  Withdrawal time=8 minutes 48 seconds. The scope was withdrawn and the procedure completed.  COMPLICATIONS: There were no immediate complications.  ENDOSCOPIC IMPRESSION: 1.   The examined terminal ileum appeared to be normal 2.   Moderate diverticulosis was noted in the left colon 3.    The examination was otherwise normal status post random colon biopsies to rule out microscopic colitis  RECOMMENDATIONS: 1.  Await biopsy results 2.  Upper endoscopy will be scheduled in the [facility] "chronic diarrhea, equivocal celiac testing"  eSigned:  Roxy CedarJohn N Perry Jr, MD 04/06/2014 4:06 PM   cc: The Patient  ; Roxanne MinsMichael Duran, MD

## 2014-04-06 NOTE — Progress Notes (Signed)
Called to room to assist during endoscopic procedure.  Patient ID and intended procedure confirmed with present staff. Received instructions for my participation in the procedure from the performing physician.  

## 2014-04-06 NOTE — Progress Notes (Signed)
Report to PACU, RN, vss, BBS= Clear.  

## 2014-04-07 ENCOUNTER — Telehealth: Payer: Self-pay | Admitting: *Deleted

## 2014-04-07 NOTE — Telephone Encounter (Signed)
  Follow up Call-  Call back number 04/06/2014  Post procedure Call Back phone  # [phone]  Permission to leave phone message Yes     Patient questions:  Do you have a fever, pain , or abdominal swelling? No. Pain Score  0 *  Have you tolerated food without any problems? Yes.    Have you been able to return to your normal activities? Yes.    Do you have any questions about your discharge instructions: Diet   No. Medications  No. Follow up visit  No.  Do you have questions or concerns about your Care? No.  Actions: * If pain score is 4 or above: No action needed, pain <4.

## 2014-04-11 HISTORY — PX: COLONOSCOPY: SHX174

## 2014-04-11 HISTORY — PX: UPPER GI ENDOSCOPY: SHX6162

## 2014-04-13 ENCOUNTER — Encounter: Payer: Self-pay | Admitting: Internal Medicine

## 2014-04-14 ENCOUNTER — Ambulatory Visit (AMBULATORY_SURGERY_CENTER): Payer: Self-pay | Admitting: *Deleted

## 2014-04-14 VITALS — Ht 68.0 in | Wt 322.0 lb

## 2014-04-14 DIAGNOSIS — R894 Abnormal immunological findings in specimens from other organs, systems and tissues: Secondary | ICD-10-CM

## 2014-04-14 NOTE — Progress Notes (Signed)
No allergies to eggs or soy. No problems with anesthesia.  Pt given Emmi instructions for EGD  No oxygen use  No diet drug use  

## 2014-04-19 ENCOUNTER — Encounter: Payer: Self-pay | Admitting: Internal Medicine

## 2014-04-19 ENCOUNTER — Ambulatory Visit (AMBULATORY_SURGERY_CENTER): Payer: BC Managed Care – PPO | Admitting: Internal Medicine

## 2014-04-19 VITALS — BP 123/67 | HR 70 | Temp 98.0°F | Resp 22 | Ht 68.0 in | Wt 322.0 lb

## 2014-04-19 DIAGNOSIS — R894 Abnormal immunological findings in specimens from other organs, systems and tissues: Secondary | ICD-10-CM

## 2014-04-19 DIAGNOSIS — K219 Gastro-esophageal reflux disease without esophagitis: Secondary | ICD-10-CM

## 2014-04-19 DIAGNOSIS — R197 Diarrhea, unspecified: Secondary | ICD-10-CM

## 2014-04-19 LAB — GLUCOSE, CAPILLARY
Glucose-Capillary: 100 mg/dL — ABNORMAL HIGH (ref 70–99)
Glucose-Capillary: 105 mg/dL — ABNORMAL HIGH (ref 70–99)

## 2014-04-19 MED ORDER — SODIUM CHLORIDE 0.9 % IV SOLN: 500.0000 mL | INTRAVENOUS | Status: DC

## 2014-04-19 NOTE — Progress Notes (Signed)
Called to room to assist during endoscopic procedure.  Patient ID and intended procedure confirmed with present staff. Received instructions for my participation in the procedure from the performing physician.  

## 2014-04-19 NOTE — Patient Instructions (Signed)
Discharge instructions given. Normal exam. Resume previous medications. YOU HAD AN ENDOSCOPIC PROCEDURE TODAY AT THE [facility]: Refer to the procedure report that was given to you for any specific questions about what was found during the examination.  If the procedure report does not answer your questions, please call your gastroenterologist to clarify.  If you requested that your care partner not be given the details of your procedure findings, then the procedure report has been included in a sealed envelope for you to review at your convenience later.  YOU SHOULD EXPECT: Some feelings of bloating in the abdomen. Passage of more gas than usual.  Walking can help get rid of the air that was put into your GI tract during the procedure and reduce the bloating. If you had a lower endoscopy (such as a colonoscopy or flexible sigmoidoscopy) you may notice spotting of blood in your stool or on the toilet paper. If you underwent a bowel prep for your procedure, then you may not have a normal bowel movement for a few days.  DIET: Your first meal following the procedure should be a light meal and then it is ok to progress to your normal diet.  A half-sandwich or bowl of soup is an example of a good first meal.  Heavy or fried foods are harder to digest and may make you feel nauseous or bloated.  Likewise meals heavy in dairy and vegetables can cause extra gas to form and this can also increase the bloating.  Drink plenty of fluids but you should avoid alcoholic beverages for 24 hours.  ACTIVITY: Your care partner should take you home directly after the procedure.  You should plan to take it easy, moving slowly for the rest of the day.  You can resume normal activity the day after the procedure however you should NOT DRIVE or use heavy machinery for 24 hours (because of the sedation medicines used during the test).    SYMPTOMS TO REPORT IMMEDIATELY: A gastroenterologist can be reached at any hour.   During normal business hours, 8:30 AM to 5:00 PM Monday through Friday, call [phone].  After hours and on weekends, please call the GI answering service at [phone] who will take a message and have the physician on call contact you.    Following upper endoscopy (EGD)  Vomiting of blood or coffee ground material  New chest pain or pain under the shoulder blades  Painful or persistently difficult swallowing  New shortness of breath  Fever of 100F or higher  Black, tarry-looking stools  FOLLOW UP: If any biopsies were taken you will be contacted by phone or by letter within the next 1-3 weeks.  Call your gastroenterologist if you have not heard about the biopsies in 3 weeks.  Our staff will call the home number listed on your records the next business day following your procedure to check on you and address any questions or concerns that you may have at that time regarding the information given to you following your procedure. This is a courtesy call and so if there is no answer at the home number and we have not heard from you through the emergency physician on call, we will assume that you have returned to your regular daily activities without incident.  SIGNATURES/CONFIDENTIALITY: You and/or your care partner have signed paperwork which will be entered into your electronic medical record.  These signatures attest to the fact that that the information above on your After Visit Summary   has been reviewed and is understood.  Full responsibility of the confidentiality of this discharge information lies with you and/or your care-partner. 

## 2014-04-19 NOTE — Progress Notes (Signed)
Ok to stick foot for IV if necessary . V/O Dr. Marina GoodellPerry per Elmon KirschnerNancy Hill RN/ Alease FramePenny Meng [place] RN

## 2014-04-19 NOTE — Op Note (Signed)
[facility] [address] [place], [id number]   ENDOSCOPY PROCEDURE REPORT  PATIENT: Marie Chen, Marie Chen  MR#: [id number] BIRTHDATE: [birth year] , 53  yrs. old GENDER: female ENDOSCOPIST: Roxy CedarJohn N Easton Fetty Jr, MD REFERRED BY:  .Direct Self PROCEDURE DATE:  04/19/2014 PROCEDURE:  EGD w/ biopsy ASA CLASS:     Class II INDICATIONS:  diarrhea.  equivocal celiac panel MEDICATIONS: Monitored anesthesia care and Propofol 300 mg IV TOPICAL ANESTHETIC: none  DESCRIPTION OF PROCEDURE: After the risks benefits and alternatives of the procedure were thoroughly explained, informed consent was obtained.  The LB [id number] endoscope was introduced through the mouth and advanced to the second portion of the duodenum , Without limitations.  The instrument was slowly withdrawn as the mucosa was fully examined.    EXAM: The esophagus and gastroesophageal junction were completely normal in appearance.  The stomach was entered and closely examined.The antrum, angularis, and lesser curvature were well visualized, including a retroflexed view of the cardia and fundus. The stomach wall was normally distensable.  The scope passed easily through the pylorus into the duodenum. The mucosa was normal throughout. Multiple post bulbar duodenal biopsies taken. Retroflexed views revealed a hiatal hernia.     The scope was then withdrawn from the patient and the procedure completed.  COMPLICATIONS: There were no immediate complications.  ENDOSCOPIC IMPRESSION: 1. Normal EGD status post duodenal biopsies 2. GERD  RECOMMENDATIONS: 1. Await pathology results 2. Further recommendations to follow biopsy results  REPEAT EXAM:  eSigned:  Roxy CedarJohn N Vonnie Spagnolo Jr, MD 04/19/2014 4:32 PM    CC:The Patient and Roxanne Minsuran, Michael MD

## 2014-04-19 NOTE — Progress Notes (Signed)
A/ox3, pleased with MAC, report to RN 

## 2014-04-20 ENCOUNTER — Telehealth: Payer: Self-pay | Admitting: *Deleted

## 2014-04-20 NOTE — Telephone Encounter (Signed)
  Follow up Call-  Call back number 04/19/2014 04/06/2014  Post procedure Call Back phone  # [phone]  Permission to leave phone message Yes Yes     Patient questions:  Do you have a fever, pain , or abdominal swelling? No. Pain Score  0 *  Have you tolerated food without any problems? Yes.    Have you been able to return to your normal activities? Yes.    Do you have any questions about your discharge instructions: Diet   No. Medications  No. Follow up visit  No.  Do you have questions or concerns about your Care? No.  Actions: * If pain score is 4 or above: No action needed, pain <4.

## 2014-04-28 ENCOUNTER — Encounter: Payer: Self-pay | Admitting: Internal Medicine

## 2014-06-06 ENCOUNTER — Encounter (HOSPITAL_COMMUNITY): Payer: Self-pay | Admitting: *Deleted

## 2014-06-06 ENCOUNTER — Emergency Department (HOSPITAL_COMMUNITY)
Admission: EM | Admit: 2014-06-06 | Discharge: 2014-06-06 | Disposition: A | Discharge status: Home / Self Care | Payer: Self-pay | Attending: Emergency Medicine | Admitting: Emergency Medicine

## 2014-06-06 ENCOUNTER — Encounter (HOSPITAL_COMMUNITY): Payer: Self-pay | Admitting: Emergency Medicine

## 2014-06-06 ENCOUNTER — Emergency Department (HOSPITAL_COMMUNITY): Payer: Self-pay

## 2014-06-06 ENCOUNTER — Emergency Department (INDEPENDENT_AMBULATORY_CARE_PROVIDER_SITE_OTHER)
Admission: EM | Admit: 2014-06-06 | Discharge: 2014-06-06 | Disposition: A | Discharge status: Nursing Home (Includes ICF) | Payer: Self-pay | Source: Home / Self Care | Attending: Family Medicine | Admitting: Family Medicine

## 2014-06-06 DIAGNOSIS — Z87891 Personal history of nicotine dependence: Secondary | ICD-10-CM | POA: Insufficient documentation

## 2014-06-06 DIAGNOSIS — Z8739 Personal history of other diseases of the musculoskeletal system and connective tissue: Secondary | ICD-10-CM | POA: Insufficient documentation

## 2014-06-06 DIAGNOSIS — Z7951 Long term (current) use of inhaled steroids: Secondary | ICD-10-CM | POA: Insufficient documentation

## 2014-06-06 DIAGNOSIS — Z8719 Personal history of other diseases of the digestive system: Secondary | ICD-10-CM | POA: Insufficient documentation

## 2014-06-06 DIAGNOSIS — G43909 Migraine, unspecified, not intractable, without status migrainosus: Secondary | ICD-10-CM | POA: Insufficient documentation

## 2014-06-06 DIAGNOSIS — F329 Major depressive disorder, single episode, unspecified: Secondary | ICD-10-CM | POA: Insufficient documentation

## 2014-06-06 DIAGNOSIS — Z79899 Other long term (current) drug therapy: Secondary | ICD-10-CM | POA: Insufficient documentation

## 2014-06-06 DIAGNOSIS — J45901 Unspecified asthma with (acute) exacerbation: Secondary | ICD-10-CM | POA: Insufficient documentation

## 2014-06-06 DIAGNOSIS — G4730 Sleep apnea, unspecified: Secondary | ICD-10-CM | POA: Insufficient documentation

## 2014-06-06 DIAGNOSIS — R0602 Shortness of breath: Secondary | ICD-10-CM

## 2014-06-06 DIAGNOSIS — I10 Essential (primary) hypertension: Secondary | ICD-10-CM | POA: Insufficient documentation

## 2014-06-06 DIAGNOSIS — E079 Disorder of thyroid, unspecified: Secondary | ICD-10-CM | POA: Insufficient documentation

## 2014-06-06 MED ORDER — IPRATROPIUM BROMIDE 0.02 % IN SOLN
0.5000 mg | Freq: Once | RESPIRATORY_TRACT | Status: AC
  Administered 2014-06-06: 0.5 mg via RESPIRATORY_TRACT
  Filled 2014-06-06: qty 2.5

## 2014-06-06 MED ORDER — ALBUTEROL SULFATE (2.5 MG/3ML) 0.083% IN NEBU
5.0000 mg | INHALATION_SOLUTION | Freq: Once | RESPIRATORY_TRACT | Status: DC
  Filled 2014-06-06: qty 6

## 2014-06-06 MED ORDER — ALBUTEROL SULFATE (2.5 MG/3ML) 0.083% IN NEBU
5.0000 mg | INHALATION_SOLUTION | Freq: Once | RESPIRATORY_TRACT | Status: AC
  Administered 2014-06-06: 5 mg via RESPIRATORY_TRACT
  Filled 2014-06-06: qty 6

## 2014-06-06 MED ORDER — ALBUTEROL SULFATE HFA 108 (90 BASE) MCG/ACT IN AERS
2.0000 | INHALATION_SPRAY | RESPIRATORY_TRACT | Status: DC | PRN
  Administered 2014-06-06: 2 via RESPIRATORY_TRACT
  Filled 2014-06-06: qty 6.7

## 2014-06-06 MED ORDER — ALBUTEROL SULFATE (2.5 MG/3ML) 0.083% IN NEBU
2.5000 mg | INHALATION_SOLUTION | Freq: Once | RESPIRATORY_TRACT | Status: AC
  Administered 2014-06-06: 2.5 mg via RESPIRATORY_TRACT

## 2014-06-06 MED ORDER — PREDNISONE 20 MG PO TABS
60.0000 mg | ORAL_TABLET | Freq: Once | ORAL | Status: AC
  Administered 2014-06-06: 60 mg via ORAL
  Filled 2014-06-06: qty 3

## 2014-06-06 MED ORDER — PREDNISONE 50 MG PO TABS: 50.0000 mg | ORAL_TABLET | Freq: Every day | ORAL | Status: DC

## 2014-06-06 NOTE — ED Notes (Signed)
The pt is c/o some sob since Wednesday and some chills  Maybe a temp.  Productive cough thick green.   Asthmatic.  Sent here from [facility]

## 2014-06-06 NOTE — ED Notes (Signed)
Wheezing, coughing, coughing up green phlegm, onset 12/23.  Audible congestion

## 2014-06-06 NOTE — Discharge Instructions (Signed)
Return to the ED with any concerns including difficulty breathing despite using albuterol every 4 hours, not drinking fluids, decreased urine output, vomiting and not able to keep down liquids or medications, decreased level of alertness/lethargy, or any other alarming symptoms °

## 2014-06-06 NOTE — ED Notes (Signed)
C/o  Rt sided rib pain especially when she breathes

## 2014-06-06 NOTE — ED Provider Notes (Signed)
CSN: [id number]     Arrival date & time 06/06/14  1721 History   First MD Initiated Contact with Patient 06/06/14 1754     Chief Complaint  Patient presents with  . Cough   (Consider location/radiation/quality/duration/timing/severity/associated sxs/prior Treatment) Patient is a 53 y.o. female presenting with cough. The history is provided by the patient.  Cough Cough characteristics:  Productive Sputum characteristics:  Green Severity:  Moderate Onset quality:  Gradual Duration:  3 days Progression:  Worsening Chronicity:  New Smoker: yes   Context: smoke exposure   Associated symptoms: fever, shortness of breath and wheezing     Past Medical History  Diagnosis Date  . Migraine   . Hypertension   . Thyroid disease   . Depression   . GERD (gastroesophageal reflux disease)   . Asthma   . Sjogren's disease   . Sleep apnea     bipap   Past Surgical History  Procedure Laterality Date  . Cholecystectomy    . Hysteroscopy    . Carpal tunnel release    . Ankle surgery     Family History  Problem Relation Age of Onset  . Heart failure Father   . Colon cancer Neg Hx    History  Substance Use Topics  . Smoking status: Former Smoker    Types: Cigarettes  . Smokeless tobacco: Never Used  . Alcohol Use: No   OB History    No data available     Review of Systems  Constitutional: Positive for fever.  HENT: Negative.   Respiratory: Positive for cough, shortness of breath and wheezing.   Cardiovascular: Negative.   Gastrointestinal: Negative.     Allergies  Macrodantin; Hydromorphone hcl; Meperidine hcl; and Tetracyclines & related  Home Medications   Prior to Admission medications   Medication Sig Start Date End Date Taking? Authorizing Provider  acetaminophen (TYLENOL) 500 MG tablet Take 1,000 mg by mouth every 6 (six) hours as needed. For pain    Historical Provider, MD  albuterol (PROVENTIL HFA;VENTOLIN HFA) 108 (90 BASE) MCG/ACT inhaler Inhale 2 puffs  into the lungs every 6 (six) hours as needed for wheezing. 01/24/12 01/23/13  Lollie SailsAndrew B Wallace, MD  escitalopram (LEXAPRO) 10 MG tablet Take 10 mg by mouth daily.    Historical Provider, MD  estradiol (ESTRACE) 0.5 MG tablet Take 0.5 mg by mouth daily.    Historical Provider, MD  fluticasone (FLONASE) 50 MCG/ACT nasal spray Place 1 spray into both nostrils daily.    Historical Provider, MD  gabapentin (NEURONTIN) 300 MG capsule Take 300 mg by mouth 3 (three) times daily.    Historical Provider, MD  glycopyrrolate (ROBINUL-FORTE) 2 MG tablet Take 1 tablet (2 mg total) by mouth 2 (two) times daily. 03/16/14   Amy S Esterwood, [name]  HYDROcodone-acetaminophen (NORCO) 10-325 MG per tablet Take 1 tablet by mouth every 6 (six) hours as needed.    Historical Provider, MD  levothyroxine (SYNTHROID, LEVOTHROID) 200 MCG tablet Take 200 mcg by mouth daily before breakfast.    Historical Provider, MD  lisinopril-hydrochlorothiazide (PRINZIDE,ZESTORETIC) 20-25 MG per tablet Take 1 tablet by mouth daily.    Historical Provider, MD  metFORMIN (GLUCOPHAGE) 1000 MG tablet Take 1,000 mg by mouth 2 (two) times daily with a meal.    Historical Provider, MD  rosuvastatin (CRESTOR) 40 MG tablet Take 40 mg by mouth at bedtime.    Historical Provider, MD   BP 103/71 mmHg  Pulse 97  Temp(Src) 98.7 F (37.1 C) (Oral)  Resp 20  SpO2 93% Physical Exam  Constitutional: She is oriented to person, place, and time. She appears well-developed and well-nourished.  HENT:  Right Ear: External ear normal.  Left Ear: External ear normal.  Mouth/Throat: Oropharynx is clear and moist.  Eyes: Pupils are equal, round, and reactive to light.  Neck: Normal range of motion. Neck supple.  Cardiovascular: Normal rate, regular rhythm, normal heart sounds and intact distal pulses.   Pulmonary/Chest: Effort normal. She has decreased breath sounds. She has wheezes. She has rhonchi. She has no rales.  Neurological: She is alert and oriented to  person, place, and time.  Skin: Skin is warm and dry.  Nursing note and vitals reviewed.   ED Course  Procedures (including critical care time) Labs Review Labs Reviewed - No data to display  Imaging Review No results found.   MDM   1. Asthmatic bronchitis with acute exacerbation   sent for eval and treatment of worsening asthmatic bronchitis.     Linna HoffJames D Kindl, MD 06/06/14 [phone]

## 2014-06-06 NOTE — ED Provider Notes (Signed)
CSN: [id number]     Arrival date & time 06/06/14  1837 History   First MD Initiated Contact with Patient 06/06/14 2108     Chief Complaint  Patient presents with  . Shortness of Breath     (Consider location/radiation/quality/duration/timing/severity/associated sxs/prior Treatment) HPI  Pt presenting with c/o cough and wheezing.  Pt sates symptoms began several days ago and have been worsening.  No fever.  Cough productive of green sputum.  She has tried her inhaler at home which has not been much relief.  She does smoke tobacco. No vomiting.  She denies hx of intubation.  No leg swelling.  No chest pain.  There are no other associated systemic symptoms, there are no other alleviating or modifying factors.   Past Medical History  Diagnosis Date  . Migraine   . Hypertension   . Thyroid disease   . Depression   . GERD (gastroesophageal reflux disease)   . Asthma   . Sjogren's disease   . Sleep apnea     bipap   Past Surgical History  Procedure Laterality Date  . Cholecystectomy    . Hysteroscopy    . Carpal tunnel release    . Ankle surgery     Family History  Problem Relation Age of Onset  . Heart failure Father   . Colon cancer Neg Hx    History  Substance Use Topics  . Smoking status: Former Smoker    Types: Cigarettes  . Smokeless tobacco: Never Used  . Alcohol Use: No   OB History    No data available     Review of Systems  ROS reviewed and all otherwise negative except for mentioned in HPI    Allergies  Macrodantin; Hydromorphone hcl; Meperidine hcl; and Tetracyclines & related  Home Medications   Prior to Admission medications   Medication Sig Start Date End Date Taking? Authorizing Provider  acetaminophen (TYLENOL) 500 MG tablet Take 1,000 mg by mouth every 6 (six) hours as needed. For pain    Historical Provider, MD  albuterol (PROVENTIL HFA;VENTOLIN HFA) 108 (90 BASE) MCG/ACT inhaler Inhale 2 puffs into the lungs every 6 (six) hours as needed for  wheezing. 01/24/12 01/23/13  Lollie SailsAndrew B Wallace, MD  escitalopram (LEXAPRO) 10 MG tablet Take 10 mg by mouth daily.    Historical Provider, MD  estradiol (ESTRACE) 0.5 MG tablet Take 0.5 mg by mouth daily.    Historical Provider, MD  fluticasone (FLONASE) 50 MCG/ACT nasal spray Place 1 spray into both nostrils daily.    Historical Provider, MD  gabapentin (NEURONTIN) 300 MG capsule Take 300 mg by mouth 3 (three) times daily.    Historical Provider, MD  glycopyrrolate (ROBINUL-FORTE) 2 MG tablet Take 1 tablet (2 mg total) by mouth 2 (two) times daily. 03/16/14   Amy S Esterwood, [name]  HYDROcodone-acetaminophen (NORCO) 10-325 MG per tablet Take 1 tablet by mouth every 6 (six) hours as needed.    Historical Provider, MD  levothyroxine (SYNTHROID, LEVOTHROID) 200 MCG tablet Take 200 mcg by mouth daily before breakfast.    Historical Provider, MD  lisinopril-hydrochlorothiazide (PRINZIDE,ZESTORETIC) 20-25 MG per tablet Take 1 tablet by mouth daily.    Historical Provider, MD  metFORMIN (GLUCOPHAGE) 1000 MG tablet Take 1,000 mg by mouth 2 (two) times daily with a meal.    Historical Provider, MD  predniSONE (DELTASONE) 50 MG tablet Take 1 tablet (50 mg total) by mouth daily. 06/06/14   Ethelda ChickMartha K Linker, MD  rosuvastatin (CRESTOR) 40 MG tablet  Take 40 mg by mouth at bedtime.    Historical Provider, MD   BP 113/51 mmHg  Pulse 102  Temp(Src) 98.4 F (36.9 C) (Oral)  Resp 21  SpO2 96%  Vitals reviewed Physical Exam  Physical Examination: General appearance - alert, well appearing, and in no distress Mental status - alert, oriented to person, place, and time Eyes - no conjunctival injection, no sclerlal icterus Mouth - mucous membranes moist, pharynx normal without lesions Chest - bilateral wheezing, coarse breath sounds bilaterally, normal respiratory effort Heart - normal rate, regular rhythm, normal S1, S2, no murmurs, rubs, clicks or gallops Abdomen - soft, nontender, nondistended, no masses or  organomegaly Extremities - peripheral pulses normal, no pedal edema, no clubbing or cyanosis Skin - normal coloration and turgor, no rashes  ED Course  Procedures (including critical care time) Labs Review Labs Reviewed - No data to display  Imaging Review No results found.   EKG Interpretation None      Date: 06/07/2014  Rate: 89  Rhythm: normal sinus rhythm  QRS Axis: normal  Intervals: normal  ST/T Wave abnormalities: normal  Conduction Disutrbances:none  Narrative Interpretation: poor r wave progression  Old EKG Reviewed: none available ekg not available in epic for interpretation in muse MDM   Final diagnoses:  Asthma exacerbation   Pt presenting with c/o wheezing and cough.  Pt treated with 3 duonebs, symptoms improved, also given prednisone.  Pt breathing comfortably on room air after breathing treatments and steriods.  CXR reassuring as well.   Xray images reviewed and interpreted by me as well.  Nursing notes including past medical history and social history reviewed and considered in documentation Prior records reviewed and considered during this visit      Ethelda ChickMartha K Linker, MD 06/10/14 [phone]

## 2014-06-28 ENCOUNTER — Other Ambulatory Visit: Payer: Self-pay | Admitting: Physician Assistant

## 2014-11-03 ENCOUNTER — Encounter: Payer: Self-pay | Admitting: Internal Medicine

## 2016-04-04 ENCOUNTER — Other Ambulatory Visit: Payer: Self-pay | Admitting: Primary Care

## 2016-04-04 DIAGNOSIS — Z1231 Encounter for screening mammogram for malignant neoplasm of breast: Secondary | ICD-10-CM

## 2016-04-19 ENCOUNTER — Ambulatory Visit
Admission: RE | Admit: 2016-04-19 | Discharge: 2016-04-19 | Disposition: A | Discharge status: Home / Self Care | Payer: No Typology Code available for payment source | Source: Ambulatory Visit | Attending: Primary Care | Admitting: Primary Care

## 2016-04-19 DIAGNOSIS — Z1231 Encounter for screening mammogram for malignant neoplasm of breast: Secondary | ICD-10-CM

## 2017-03-04 DIAGNOSIS — M25569 Pain in unspecified knee: Secondary | ICD-10-CM

## 2017-03-04 HISTORY — DX: Pain in unspecified knee: M25.569

## 2017-04-25 DIAGNOSIS — G894 Chronic pain syndrome: Secondary | ICD-10-CM | POA: Insufficient documentation

## 2017-04-25 DIAGNOSIS — M160 Bilateral primary osteoarthritis of hip: Secondary | ICD-10-CM

## 2017-04-25 DIAGNOSIS — M9903 Segmental and somatic dysfunction of lumbar region: Secondary | ICD-10-CM

## 2017-04-25 DIAGNOSIS — M170 Bilateral primary osteoarthritis of knee: Secondary | ICD-10-CM | POA: Insufficient documentation

## 2017-04-25 DIAGNOSIS — M47816 Spondylosis without myelopathy or radiculopathy, lumbar region: Secondary | ICD-10-CM | POA: Insufficient documentation

## 2017-04-25 HISTORY — DX: Bilateral primary osteoarthritis of knee: M17.0

## 2017-04-25 HISTORY — DX: Bilateral primary osteoarthritis of hip: M16.0

## 2017-04-25 HISTORY — DX: Spondylosis without myelopathy or radiculopathy, lumbar region: M47.816

## 2017-04-25 HISTORY — DX: Chronic pain syndrome: G89.4

## 2017-04-25 HISTORY — DX: Segmental and somatic dysfunction of lumbar region: M99.03

## 2017-08-01 DIAGNOSIS — L84 Corns and callosities: Secondary | ICD-10-CM

## 2017-08-01 DIAGNOSIS — M722 Plantar fascial fibromatosis: Secondary | ICD-10-CM | POA: Insufficient documentation

## 2017-08-01 DIAGNOSIS — T85848A Pain due to other internal prosthetic devices, implants and grafts, initial encounter: Secondary | ICD-10-CM | POA: Insufficient documentation

## 2017-08-01 HISTORY — DX: Corns and callosities: L84

## 2017-08-01 HISTORY — DX: Plantar fascial fibromatosis: M72.2

## 2017-08-01 HISTORY — DX: Pain due to other internal prosthetic devices, implants and grafts, initial encounter: T85.848A

## 2017-09-19 DIAGNOSIS — M5416 Radiculopathy, lumbar region: Secondary | ICD-10-CM

## 2017-09-19 HISTORY — DX: Radiculopathy, lumbar region: M54.16

## 2017-11-13 DIAGNOSIS — M546 Pain in thoracic spine: Secondary | ICD-10-CM

## 2017-11-13 HISTORY — DX: Pain in thoracic spine: M54.6

## 2018-01-30 DIAGNOSIS — L84 Corns and callosities: Secondary | ICD-10-CM

## 2018-01-30 HISTORY — DX: Corns and callosities: L84

## 2018-03-02 DIAGNOSIS — M7062 Trochanteric bursitis, left hip: Secondary | ICD-10-CM | POA: Insufficient documentation

## 2018-03-02 HISTORY — DX: Trochanteric bursitis, left hip: M70.62

## 2018-05-11 ENCOUNTER — Emergency Department (HOSPITAL_COMMUNITY): Payer: Medicare HMO

## 2018-05-11 ENCOUNTER — Encounter (HOSPITAL_COMMUNITY): Payer: Self-pay | Admitting: *Deleted

## 2018-05-11 ENCOUNTER — Observation Stay (HOSPITAL_COMMUNITY)
Admission: EM | Admit: 2018-05-11 | Discharge: 2018-05-13 | Disposition: A | Discharge status: Home / Self Care | Payer: Medicare HMO | Attending: Internal Medicine | Admitting: Internal Medicine

## 2018-05-11 DIAGNOSIS — M3500 Sicca syndrome, unspecified: Secondary | ICD-10-CM | POA: Diagnosis not present

## 2018-05-11 DIAGNOSIS — I10 Essential (primary) hypertension: Secondary | ICD-10-CM | POA: Diagnosis present

## 2018-05-11 DIAGNOSIS — G4733 Obstructive sleep apnea (adult) (pediatric): Secondary | ICD-10-CM | POA: Diagnosis not present

## 2018-05-11 DIAGNOSIS — R042 Hemoptysis: Principal | ICD-10-CM | POA: Diagnosis present

## 2018-05-11 DIAGNOSIS — E1169 Type 2 diabetes mellitus with other specified complication: Secondary | ICD-10-CM | POA: Diagnosis present

## 2018-05-11 DIAGNOSIS — G8929 Other chronic pain: Secondary | ICD-10-CM | POA: Insufficient documentation

## 2018-05-11 DIAGNOSIS — Z794 Long term (current) use of insulin: Secondary | ICD-10-CM | POA: Diagnosis not present

## 2018-05-11 DIAGNOSIS — J45909 Unspecified asthma, uncomplicated: Secondary | ICD-10-CM | POA: Insufficient documentation

## 2018-05-11 DIAGNOSIS — E669 Obesity, unspecified: Secondary | ICD-10-CM

## 2018-05-11 DIAGNOSIS — E039 Hypothyroidism, unspecified: Secondary | ICD-10-CM | POA: Diagnosis present

## 2018-05-11 DIAGNOSIS — E119 Type 2 diabetes mellitus without complications: Secondary | ICD-10-CM | POA: Diagnosis not present

## 2018-05-11 DIAGNOSIS — Z79899 Other long term (current) drug therapy: Secondary | ICD-10-CM | POA: Insufficient documentation

## 2018-05-11 DIAGNOSIS — E876 Hypokalemia: Secondary | ICD-10-CM | POA: Diagnosis not present

## 2018-05-11 DIAGNOSIS — J189 Pneumonia, unspecified organism: Secondary | ICD-10-CM | POA: Diagnosis present

## 2018-05-11 HISTORY — DX: Type 2 diabetes mellitus without complications: E11.9

## 2018-05-11 HISTORY — DX: Hemoptysis: R04.2

## 2018-05-11 LAB — CBC WITH DIFFERENTIAL/PLATELET
ABS IMMATURE GRANULOCYTES: 0.03 10*3/uL (ref 0.00–0.07)
Basophils Absolute: 0 10*3/uL (ref 0.0–0.1)
Basophils Relative: 0 %
Eosinophils Absolute: 0.2 10*3/uL (ref 0.0–0.5)
Eosinophils Relative: 2 %
HEMATOCRIT: 44.2 % (ref 36.0–46.0)
HEMOGLOBIN: 12.9 g/dL (ref 12.0–15.0)
Immature Granulocytes: 0 %
LYMPHS PCT: 27 %
Lymphs Abs: 2.7 10*3/uL (ref 0.7–4.0)
MCH: 24.9 pg — ABNORMAL LOW (ref 26.0–34.0)
MCHC: 29.2 g/dL — ABNORMAL LOW (ref 30.0–36.0)
MCV: 85.3 fL (ref 80.0–100.0)
Monocytes Absolute: 0.6 10*3/uL (ref 0.1–1.0)
Monocytes Relative: 6 %
NEUTROS ABS: 6.4 10*3/uL (ref 1.7–7.7)
NRBC: 0 % (ref 0.0–0.2)
Neutrophils Relative %: 65 %
Platelets: 341 10*3/uL (ref 150–400)
RBC: 5.18 MIL/uL — ABNORMAL HIGH (ref 3.87–5.11)
RDW: 15.6 % — ABNORMAL HIGH (ref 11.5–15.5)
WBC: 10.1 10*3/uL (ref 4.0–10.5)

## 2018-05-11 LAB — ABO/RH: ABO/RH(D): O POS

## 2018-05-11 LAB — BASIC METABOLIC PANEL
Anion gap: 10 (ref 5–15)
BUN: 12 mg/dL (ref 6–20)
CALCIUM: 9.6 mg/dL (ref 8.9–10.3)
CO2: 27 mmol/L (ref 22–32)
Chloride: 98 mmol/L (ref 98–111)
Creatinine, Ser: 1 mg/dL (ref 0.44–1.00)
GLUCOSE: 174 mg/dL — AB (ref 70–99)
Potassium: 3.4 mmol/L — ABNORMAL LOW (ref 3.5–5.1)
Sodium: 135 mmol/L (ref 135–145)

## 2018-05-11 LAB — APTT: APTT: 34 s (ref 24–36)

## 2018-05-11 LAB — TYPE AND SCREEN
ABO/RH(D): O POS
ANTIBODY SCREEN: NEGATIVE

## 2018-05-11 LAB — PROTIME-INR
INR: 1.1
Prothrombin Time: 14.1 seconds (ref 11.4–15.2)

## 2018-05-11 LAB — CBG MONITORING, ED: Glucose-Capillary: 173 mg/dL — ABNORMAL HIGH (ref 70–99)

## 2018-05-11 MED ORDER — IOPAMIDOL (ISOVUE-370) INJECTION 76%
100.0000 mL | Freq: Once | INTRAVENOUS | Status: AC | PRN
  Administered 2018-05-11: 100 mL via INTRAVENOUS

## 2018-05-11 MED ORDER — SODIUM CHLORIDE 0.9 % IV SOLN
1.0000 g | INTRAVENOUS | Status: DC
  Administered 2018-05-11: 1 g via INTRAVENOUS
  Filled 2018-05-11 (×2): qty 10

## 2018-05-11 MED ORDER — IOPAMIDOL (ISOVUE-370) INJECTION 76%
INTRAVENOUS | Status: AC
  Filled 2018-05-11: qty 100

## 2018-05-11 MED ORDER — ALBUTEROL SULFATE (2.5 MG/3ML) 0.083% IN NEBU
5.0000 mg | INHALATION_SOLUTION | Freq: Once | RESPIRATORY_TRACT | Status: AC
  Administered 2018-05-11: 5 mg via RESPIRATORY_TRACT
  Filled 2018-05-11: qty 6

## 2018-05-11 MED ORDER — AZITHROMYCIN 250 MG PO TABS
500.0000 mg | ORAL_TABLET | ORAL | Status: DC
  Administered 2018-05-11 – 2018-05-12 (×2): 500 mg via ORAL
  Filled 2018-05-11 (×2): qty 2

## 2018-05-11 NOTE — ED Notes (Signed)
Pt began to cough up bright red blood which required suctioning. She coughed up ~15-20 mL of blood.

## 2018-05-11 NOTE — ED Triage Notes (Signed)
To ED via pov for eval of coughing frank blood since around 4pm today. States she feels phlegm and coughs up blood. No vomiting. No nose bleed. resp even and unlabored. Appears in nad. Denies pain. Pt has napkin with bright red blood on in.

## 2018-05-11 NOTE — ED Notes (Signed)
Patient transported to CT 

## 2018-05-11 NOTE — ED Notes (Signed)
Patient transported to X-ray 

## 2018-05-11 NOTE — ED Provider Notes (Signed)
[facility] EMERGENCY DEPARTMENT Provider Note   CSN: [id number] Arrival date & time: 05/11/18  1914     History   Chief Complaint Chief Complaint  Patient presents with  . coughing blood    HPI Marie Chen is a 57 y.o. female.  Patient is a 57 year old female with a history of hypertension, reflux, asthma, Sjogren's disease who presents with hemoptysis.  She states that she started coughing up some blood today.  She has had several episodes of frank blood.  She has not really had any significant coughing but she feels like there is some mucus in the back of her throat and in her lungs and she gets it up and it is frank blood.  She has a little bit of shortness of breath.  She has a little discomfort feeling in the center of her chest.  She denies any dizziness.  She says she just feels fatigued.  No abdominal pain.  No history of similar symptoms in the past.  She is not on anticoagulants.  She denies vomiting any blood.  No blood in her stool or diarrhea.     Past Medical History:  Diagnosis Date  . Asthma   . Depression   . Diabetes mellitus without complication (HCC)   . GERD (gastroesophageal reflux disease)   . Hypertension   . Migraine   . Sjogren's disease (HCC)   . Sleep apnea    bipap  . Thyroid disease     Patient Active Problem List   Diagnosis Date Noted  . Cough with hemoptysis 05/11/2018  . ACUTE PHARYNGITIS 05/23/2009  . SINUSITIS- ACUTE-NOS 02/24/2009  . HEADACHE 02/24/2009  . ASTHMATIC BRONCHITIS, ACUTE 04/27/2008  . DYSPHAGIA 04/27/2008  . MIGRAINE, CHRONIC 04/22/2008  . BRONCHITIS, CHRONIC 04/22/2008  . PERSONAL HISTORY ENDOCRN METABOLIC&IMMUNITY D/O 04/22/2008  . DIABETES MELLITUS, TYPE II 03/26/2007  . 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 ALLERGIC RHINITIS 03/26/2007  . ASTHMA NOS W/ACUTE EXACERBATION 03/26/2007  . PEPTIC ULCER DISEASE 03/26/2007  . RESTLESS LEG SYNDROME, HX OF 03/26/2007  .  IRRITABLE BOWEL SYNDROME, HX OF 03/26/2007  . HYPOTHYROIDISM 01/05/2007  . HYPERLIPIDEMIA 01/05/2007  . DEPRESSION 01/05/2007  . SLEEP APNEA, OBSTRUCTIVE 01/05/2007  . HYPERTENSION 01/05/2007  . GERD 01/05/2007  . HAMMER TOE 01/05/2007    Past Surgical History:  Procedure Laterality Date  . ANKLE SURGERY    . CARPAL TUNNEL RELEASE    . CHOLECYSTECTOMY    . HYSTEROSCOPY       OB History   None      Home Medications    Prior to Admission medications   Medication Sig Start Date End Date Taking? Authorizing Provider  cyclobenzaprine (FLEXERIL) 10 MG tablet Take 10 mg by mouth every 8 (eight) hours as needed for muscle spasms.  04/14/18  Yes [provider]  escitalopram (LEXAPRO) 10 MG tablet Take 10 mg by mouth at bedtime.    Yes [provider]  fluticasone (FLONASE) 50 MCG/ACT nasal spray Place 1 spray into both nostrils daily as needed (seasonal allergies).    Yes [provider]  fluticasone furoate-vilanterol (BREO ELLIPTA) 100-25 MCG/INH AEPB Inhale 1 puff into the lungs daily.   Yes [provider]  gabapentin (NEURONTIN) 600 MG tablet Take 600-1,200 mg by mouth See admin instructions. Take one tablet (600 mg) by mouth every morning, take two tablets (1200 mg) at bedtime 04/16/18  Yes [provider]  HYDROcodone-acetaminophen (NORCO) 10-325 MG per  tablet Take 1 tablet by mouth 4 (four) times daily as needed (pain).    Yes [provider]  insulin aspart (NOVOLOG FLEXPEN) 100 UNIT/ML FlexPen Inject 5-10 Units into the skin See admin instructions. Inject 5-10 units subcutaneously three time daily before meals per sliding scale: CBG up to 175 - 5 units, 175-200 7 units, >200 10 units   Yes [provider]  insulin glargine (LANTUS) 100 unit/mL SOPN Inject 45 Units into the skin daily before breakfast.   Yes [provider]  ipratropium-albuterol (DUONEB) 0.5-2.5 (3) MG/3ML SOLN Take 3 mLs by nebulization every  6 (six) hours as needed (shortness of breath/wheezing).   Yes [provider]  levothyroxine (SYNTHROID, LEVOTHROID) 112 MCG tablet Take 112 mcg by mouth daily with breakfast.    Yes [provider]  lisinopril-hydrochlorothiazide (PRINZIDE,ZESTORETIC) 20-25 MG per tablet Take 1 tablet by mouth daily with breakfast.    Yes [provider]  montelukast (SINGULAIR) 10 MG tablet Take 10 mg by mouth daily with breakfast.   Yes [provider]  naproxen sodium (ALEVE) 220 MG tablet Take 440 mg by mouth 2 (two) times daily as needed (pain/headache).   Yes [provider]  terconazole (TERAZOL 7) 0.4 % vaginal cream Place 1 applicator vaginally at bedtime as needed (yeast infections).  10/25/17  Yes [provider]  topiramate (TOPAMAX) 25 MG tablet Take 25 mg by mouth at bedtime. 03/26/18  Yes [provider]  albuterol (PROVENTIL HFA;VENTOLIN HFA) 108 (90 BASE) MCG/ACT inhaler Inhale 2 puffs into the lungs every 6 (six) hours as needed for wheezing. Patient not taking: Reported on 05/11/2018 01/24/12 01/23/13  Lollie SailsWallace, Andrew B, MD  glycopyrrolate (ROBINUL) 2 MG tablet TAKE ONE TABLET BY MOUTH TWICE DAILY Patient not taking: Reported on 05/11/2018 06/29/14   Sammuel CooperEsterwood, Amy S, [name]    Family History Family History  Problem Relation Age of Onset  . Heart failure Father   . Colon cancer Neg Hx     Social History Social History   Tobacco Use  . Smoking status: Former Smoker    Types: Cigarettes  . Smokeless tobacco: Never Used  Substance Use Topics  . Alcohol use: No    Alcohol/week: 0.0 standard drinks  . Drug use: No     Allergies   Macrodantin [nitrofurantoin macrocrystal]; Metformin and related; Hydromorphone hcl; Meperidine hcl; and Tetracyclines & related   Review of Systems Review of Systems  Constitutional: Positive for fatigue. Negative for chills, diaphoresis and fever.  HENT: Negative for congestion, rhinorrhea and  sneezing.   Eyes: Negative.   Respiratory: Positive for shortness of breath and wheezing. Negative for cough and chest tightness.        Hemoptysis  Cardiovascular: Positive for chest pain. Negative for leg swelling.  Gastrointestinal: Negative for abdominal pain, blood in stool, diarrhea, nausea and vomiting.  Genitourinary: Negative for difficulty urinating, flank pain, frequency and hematuria.  Musculoskeletal: Negative for arthralgias and back pain.  Skin: Negative for rash.  Neurological: Negative for dizziness, speech difficulty, weakness, numbness and headaches.     Physical Exam Updated Vital Signs BP 111/81   Pulse 98   Temp 98.3 F (36.8 C) (Oral)   Resp 15   SpO2 94%   Physical Exam  Constitutional: She is oriented to person, place, and time. She appears well-developed and well-nourished.  HENT:  Head: Normocephalic and atraumatic.  Eyes: Pupils are equal, round, and reactive to light.  Neck: Normal range of motion. Neck supple.  Cardiovascular: Normal rate, regular rhythm and normal heart sounds.  Pulmonary/Chest: Effort normal. No respiratory distress. She has wheezes. She has no rales. She exhibits no tenderness.  Abdominal: Soft. Bowel sounds are normal. There is no tenderness. There is no rebound and no guarding.  Musculoskeletal: Normal range of motion. She exhibits edema.  Lymphadenopathy:    She has no cervical adenopathy.  Neurological: She is alert and oriented to person, place, and time.  Skin: Skin is warm and dry. No rash noted.  Psychiatric: She has a normal mood and affect.     ED Treatments / Results  Labs (all labs ordered are listed, but only abnormal results are displayed) Labs Reviewed  BASIC METABOLIC PANEL - Abnormal; Notable for the following components:      Result Value   Potassium 3.4 (*)    Glucose, Bld 174 (*)    All other components within normal limits  CBC WITH DIFFERENTIAL/PLATELET - Abnormal; Notable for the following  components:   RBC 5.18 (*)    MCH 24.9 (*)    MCHC 29.2 (*)    RDW 15.6 (*)    All other components within normal limits  CBG MONITORING, ED - Abnormal; Notable for the following components:   Glucose-Capillary 173 (*)    All other components within normal limits  PROTIME-INR  APTT  URINALYSIS, ROUTINE W REFLEX MICROSCOPIC  INFLUENZA PANEL BY PCR (TYPE A & B)  TYPE AND SCREEN  ABO/RH    EKG EKG Interpretation  Date/Time:  Sunday May 11 2018 20:21:31 EST Ventricular Rate:  82 PR Interval:    QRS Duration: 101 QT Interval:  368 QTC Calculation: 430 R Axis:   14 Text Interpretation:  Sinus rhythm Low voltage, precordial leads since last tracing no significant change Confirmed by Rolan Bucco [phone]) on 05/11/2018 8:24:13 PM   Radiology Dg Chest 2 View  Result Date: 05/11/2018 CLINICAL DATA:  Hemoptysis. EXAM: CHEST - 2 VIEW COMPARISON:  Nov 06, 2017 FINDINGS: The heart size and mediastinal contours are within normal limits. Both lungs are clear. The visualized skeletal structures are unremarkable. IMPRESSION: No cause for hemoptysis identified.  Normal chest. Electronically Signed   By: Gerome Sam III M.D   On: 05/11/2018 20:11   Ct Angio Chest Pe W/cm &/or Wo Cm  Result Date: 05/11/2018 CLINICAL DATA:  Hemoptysis.  Shortness of breath.  Left side pain. EXAM: CT ANGIOGRAPHY CHEST WITH CONTRAST TECHNIQUE: Multidetector CT imaging of the chest was performed using the standard protocol during bolus administration of intravenous contrast. Multiplanar CT image reconstructions and MIPs were obtained to evaluate the vascular anatomy. CONTRAST:  ISOVUE-370 IOPAMIDOL (ISOVUE-370) INJECTION 76% COMPARISON:  Chest x-ray earlier today. FINDINGS: Cardiovascular: No filling defects in the pulmonary arteries to suggest pulmonary emboli. Scattered aortic and coronary artery calcifications. Heart is normal size. Aorta is normal caliber. Mediastinum/Nodes: No mediastinal, hilar, or  axillary adenopathy. Small hiatal hernia. Lungs/Pleura: Airspace disease noted in the right lower lobe could reflect early pneumonia. No effusions. Linear atelectasis or scarring anteriorly in the lingula and right upper lobe. Upper Abdomen: Imaging into the upper abdomen shows no acute findings. Prior cholecystectomy. Musculoskeletal: Chest wall soft tissues are unremarkable. No acute bony abnormality. Review of the MIP images confirms the above findings. IMPRESSION: No evidence of pulmonary embolus. Airspace opacity in the posterior right lower lobe concerning for early pneumonia. Coronary artery disease, aortic atherosclerosis. Electronically Signed   By: Charlett Nose M.D.   On: 05/11/2018 22:58  Procedures Procedures (including critical care time)  Medications Ordered in ED Medications  iopamidol (ISOVUE-370) 76 % injection (has no administration in time range)  cefTRIAXone (ROCEPHIN) 1 g in sodium chloride 0.9 % 100 mL IVPB (has no administration in time range)  azithromycin (ZITHROMAX) tablet 500 mg (has no administration in time range)  albuterol (PROVENTIL) (2.5 MG/3ML) 0.083% nebulizer solution 5 mg (5 mg Nebulization Given 05/11/18 2040)  iopamidol (ISOVUE-370) 76 % injection 100 mL (100 mLs Intravenous Contrast Given 05/11/18 2228)     Initial Impression / Assessment and Plan / ED Course  I have reviewed the triage vital signs and the nursing notes.  Pertinent labs & imaging results that were available during my care of the patient were reviewed by me and considered in my medical decision making (see chart for details).  Clinical Course as of May 12 2315  Sun May 11, 2018  2100 Pt coughing up frank blood, having choking spell.  Was suctioned, this resolved.  Still wheezing, no stridor, given albuterol neb, better after this.  I have spoken with pulmonology who will come see pt.  Awaiting CT chest   [MB]    Clinical Course User Index [MB] Rolan Bucco, MD    Patient is a  57 year old female who presents with frank hemoptysis.  She does have a history of Sjogren's syndrome.  No hematemesis.  She is hemodynamically stable.  Her hemoglobin is normal.  She is not on anticoagulants.  Her vital signs are stable.  Pulmonology has seen the patient and feels that this is most likely related to her Sjogren's syndrome.  They recommend overnight observation.  Her CT scan shows a possible early pneumonia but otherwise unremarkable.  She was started on antibiotics.  I spoke with Dr. Toniann Fail who will admit the patient for further treatment.  Final Clinical Impressions(s) / ED Diagnoses   Final diagnoses:  Hemoptysis  Community acquired pneumonia, unspecified laterality    ED Discharge Orders    None       Rolan Bucco, MD 05/11/18 2317

## 2018-05-11 NOTE — Consult Note (Signed)
NAME:  Marie CrockerSherry L Chen, MRN:  [id number], DOB:  06/19/1960, LOS: 0 ADMISSION DATE:  05/11/2018, CONSULTATION DATE:  05/11/18 REFERRING MD:  ED, CHIEF COMPLAINT:  hemoptysis  Brief History   57 yo F hx of Sjogrens p/w 1 day history of cough and hemoptysis.   History of present illness   57 yo female hx of Sjogrens disease, asthma , morbid obesity, GERD , migraines presenting with one day history of cough with hemoptysis, subjective fevers/chills, sputum production and mild shortness of breath. She had 4 episodes of cough with hemoptysis at home and 2 in the ED. She does not take any blood thinners or aspirin. Says she has pharyngitis or pneumonia at least once a year and is no longer taking steroids for her Sjogrens disease.  During my evaluation she is AAO3 , speaking in full sentences , 100% on room air. Denies any hematuria, chest pain, hematemesis. She has had previous episodes of hemoptysis in the past but not recently. No melena or BRBPR. No rhinorrehea either.  Past Medical History  As stated in the HPI  Significant Hospital Events   2 episodes of small hemoptysis  Consults:  CCM  Procedures:  none  Significant Diagnostic Tests:  CXR - clear CT angio chest - pending  Micro Data:  Check Flu  Antimicrobials:  none   Objective   Blood pressure 120/70, pulse 99, temperature 98.3 F (36.8 C), temperature source Oral, resp. rate 17, SpO2 94 %.       No intake or output data in the 24 hours ending 05/11/18 2145 There were no vitals filed for this visit.  Examination: General: The patient is awake, alert, and in NAD  HEENT: The scalp is atraumatic. PERRL. EOMI. Conjunctivae and sclerae are normal. There is no icterus. Pharynx is erythematous , no signs of blood or exudates.  Neck: Supple. No submandibular/cervical LA. Trachea midline, No thyromegaly.  Lungs: decrease BS b/l , poor air entry , no wheezing or ronchi Cardiovascular: Normal S1/S2, no murmurs or rubs.    Abdomen: Soft, NT,ND, No masses, OM or guarding. Normoactive bowel sounds.  Extremities: Warm and well perfused. No cyanosis, clubbing or edema.  Skin: Intact with no rashes, excessive bruising, or petechiae.  MSK: Strength 5/5 and symmetric x 4.  Neuro: CN II-XII grossly intact. Sensation intact to light touch over all 4 extremities.  Psychiatric: Oriented x 3. Affect and mood normal.    Assessment & Plan:  Hemoptysis - fevers and upper airway URI sxs , this is most likely secondary to pharyngitis, she has hx of Sjogrens so her mucosa is compromised. She is certainly at risk of ILD and bronchiectasis and we will check a CT chest angio now, but suspect this will be normal.  Check urinalysis for rbc's , if she continues to have hemoptysis and urine + might be worthwhile checking ANCA's and proceeding with bronchoscopy.  Check influenza and viral panel , urinalysis and CT chest. Ok to admit to the medical floor. Will follow CT chest results, if there are any concerning findings on the CT will see her again.    Best practice:  Diet: sips with meds Pain/Anxiety/Delirium protocol (if indicated): n/a VAP protocol (if indicated): n/a DVT prophylaxis: scd's GI prophylaxis: n/a Glucose control: NISS Mobility: as tolerated Code Status: FULL Family Communication: UPDATED at bedside in the ED Disposition: OK to admit to the floor.  Labs   CBC: Recent Labs  Lab 05/11/18 2003  WBC 10.1  NEUTROABS 6.4  HGB 12.9  HCT 44.2  MCV 85.3  PLT 341    Basic Metabolic Panel: Recent Labs  Lab 05/11/18 2003  NA 135  K 3.4*  CL 98  CO2 27  GLUCOSE 174*  BUN 12  CREATININE 1.00  CALCIUM 9.6   GFR: CrCl cannot be calculated (Unknown ideal weight.). Recent Labs  Lab 05/11/18 2003  WBC 10.1    Liver Function Tests: No results for input(s): AST, ALT, ALKPHOS, BILITOT, PROT, ALBUMIN in the last 168 hours. No results for input(s): LIPASE, AMYLASE in the last 168 hours. No results for  input(s): AMMONIA in the last 168 hours.  ABG    Component Value Date/Time   HCO3 23.1 05/01/2007 1839   TCO2 24 05/01/2007 1839     Coagulation Profile: Recent Labs  Lab 05/11/18 2003  INR 1.10    Cardiac Enzymes: No results for input(s): CKTOTAL, CKMB, CKMBINDEX, TROPONINI in the last 168 hours.  HbA1C: Hgb A1c MFr Bld  Date/Time Value Ref Range Status  03/14/2009 11:47 AM 6.4 4.6 - 6.5 % Final    Comment:    See lab report for associated comment(s)  08/31/2008 02:21 PM 6.4 4.6 - 6.5 % Final    Comment:    See lab report for associated comment(s)    CBG: No results for input(s): GLUCAP in the last 168 hours.  Review of Systems:   12 point ROS done and negative , pertinent positives are mentioned in the HPI.  Past Medical History  She,  has a past medical history of Asthma, Depression, GERD (gastroesophageal reflux disease), Hypertension, Migraine, Sjogren's disease (HCC), Sleep apnea, and Thyroid disease.   Surgical History    Past Surgical History:  Procedure Laterality Date  . ANKLE SURGERY    . CARPAL TUNNEL RELEASE    . CHOLECYSTECTOMY    . HYSTEROSCOPY       Social History   reports that she has quit smoking. Her smoking use included cigarettes. She has never used smokeless tobacco. She reports that she does not drink alcohol or use drugs.   Family History   Her family history includes Heart failure in her father. There is no history of Colon cancer.   Allergies Allergies  Allergen Reactions  . Macrodantin [Nitrofurantoin Macrocrystal] Rash  . Hydromorphone Hcl Nausea And Vomiting  . Meperidine Hcl Nausea Only    Is ok if given with Phenergan  . Tetracyclines & Related Rash     Home Medications  Prior to Admission medications   Medication Sig Start Date End Date Taking? Authorizing Provider  acetaminophen (TYLENOL) 500 MG tablet Take 1,000 mg by mouth every 6 (six) hours as needed. For pain    [provider]  albuterol (PROVENTIL  HFA;VENTOLIN HFA) 108 (90 BASE) MCG/ACT inhaler Inhale 2 puffs into the lungs every 6 (six) hours as needed for wheezing. 01/24/12 01/23/13  Lollie Sails, MD  escitalopram (LEXAPRO) 10 MG tablet Take 10 mg by mouth daily.    [provider]  estradiol (ESTRACE) 0.5 MG tablet Take 0.5 mg by mouth daily.    [provider]  fluticasone (FLONASE) 50 MCG/ACT nasal spray Place 1 spray into both nostrils daily.    [provider]  gabapentin (NEURONTIN) 300 MG capsule Take 300 mg by mouth 3 (three) times daily.    [provider]  glycopyrrolate (ROBINUL) 2 MG tablet TAKE ONE TABLET BY MOUTH TWICE DAILY 06/29/14   Esterwood, Amy S, [name]  HYDROcodone-acetaminophen (NORCO) 10-325 MG  per tablet Take 1 tablet by mouth every 6 (six) hours as needed.    [provider]  levothyroxine (SYNTHROID, LEVOTHROID) 200 MCG tablet Take 200 mcg by mouth daily before breakfast.    [provider]  lisinopril-hydrochlorothiazide (PRINZIDE,ZESTORETIC) 20-25 MG per tablet Take 1 tablet by mouth daily.    [provider]  metFORMIN (GLUCOPHAGE) 1000 MG tablet Take 1,000 mg by mouth 2 (two) times daily with a meal.    [provider]  predniSONE (DELTASONE) 50 MG tablet Take 1 tablet (50 mg total) by mouth daily. 06/06/14   Mabe, Latanya Maudlin, MD  rosuvastatin (CRESTOR) 40 MG tablet Take 40 mg by mouth at bedtime.    [provider]     Time spent : 35 minutes

## 2018-05-12 ENCOUNTER — Encounter (HOSPITAL_COMMUNITY): Payer: Self-pay | Admitting: Internal Medicine

## 2018-05-12 ENCOUNTER — Other Ambulatory Visit: Payer: Self-pay

## 2018-05-12 DIAGNOSIS — J189 Pneumonia, unspecified organism: Secondary | ICD-10-CM | POA: Diagnosis not present

## 2018-05-12 DIAGNOSIS — E669 Obesity, unspecified: Secondary | ICD-10-CM

## 2018-05-12 DIAGNOSIS — R042 Hemoptysis: Secondary | ICD-10-CM

## 2018-05-12 DIAGNOSIS — I10 Essential (primary) hypertension: Secondary | ICD-10-CM

## 2018-05-12 DIAGNOSIS — E1169 Type 2 diabetes mellitus with other specified complication: Secondary | ICD-10-CM | POA: Diagnosis not present

## 2018-05-12 HISTORY — DX: Type 2 diabetes mellitus with other specified complication: E66.9

## 2018-05-12 HISTORY — DX: Type 2 diabetes mellitus with other specified complication: E11.69

## 2018-05-12 LAB — URINALYSIS, ROUTINE W REFLEX MICROSCOPIC
BILIRUBIN URINE: NEGATIVE
Glucose, UA: NEGATIVE mg/dL
HGB URINE DIPSTICK: NEGATIVE
KETONES UR: NEGATIVE mg/dL
NITRITE: NEGATIVE
PROTEIN: NEGATIVE mg/dL
SPECIFIC GRAVITY, URINE: 1.044 — AB (ref 1.005–1.030)
pH: 6 (ref 5.0–8.0)

## 2018-05-12 LAB — CBC
HEMATOCRIT: 37.1 % (ref 36.0–46.0)
Hemoglobin: 11 g/dL — ABNORMAL LOW (ref 12.0–15.0)
MCH: 25.2 pg — ABNORMAL LOW (ref 26.0–34.0)
MCHC: 29.6 g/dL — ABNORMAL LOW (ref 30.0–36.0)
MCV: 84.9 fL (ref 80.0–100.0)
Platelets: 309 10*3/uL (ref 150–400)
RBC: 4.37 MIL/uL (ref 3.87–5.11)
RDW: 15.5 % (ref 11.5–15.5)
WBC: 9.1 10*3/uL (ref 4.0–10.5)
nRBC: 0 % (ref 0.0–0.2)

## 2018-05-12 LAB — BASIC METABOLIC PANEL
Anion gap: 11 (ref 5–15)
BUN: 12 mg/dL (ref 6–20)
CHLORIDE: 100 mmol/L (ref 98–111)
CO2: 25 mmol/L (ref 22–32)
Calcium: 8.9 mg/dL (ref 8.9–10.3)
Creatinine, Ser: 0.79 mg/dL (ref 0.44–1.00)
GFR calc Af Amer: >60 mL/min (ref >60)
GFR calc non Af Amer: >60 mL/min (ref >60)
GLUCOSE: 149 mg/dL — AB (ref 70–99)
Potassium: 3.2 mmol/L — ABNORMAL LOW (ref 3.5–5.1)
Sodium: 136 mmol/L (ref 135–145)

## 2018-05-12 LAB — INFLUENZA PANEL BY PCR (TYPE A & B)
Influenza A By PCR: NEGATIVE
Influenza B By PCR: NEGATIVE

## 2018-05-12 LAB — HIV ANTIBODY (ROUTINE TESTING W REFLEX): HIV Screen 4th Generation wRfx: NONREACTIVE

## 2018-05-12 LAB — GLUCOSE, CAPILLARY
Glucose-Capillary: 165 mg/dL — ABNORMAL HIGH (ref 70–99)
Glucose-Capillary: 134 mg/dL — ABNORMAL HIGH (ref 70–99)
Glucose-Capillary: 124 mg/dL — ABNORMAL HIGH (ref 70–99)
Glucose-Capillary: 172 mg/dL — ABNORMAL HIGH (ref 70–99)

## 2018-05-12 LAB — STREP PNEUMONIAE URINARY ANTIGEN: Strep Pneumo Urinary Antigen: NEGATIVE

## 2018-05-12 LAB — MAGNESIUM: Magnesium: 1.6 mg/dL — ABNORMAL LOW (ref 1.7–2.4)

## 2018-05-12 MED ORDER — CYCLOBENZAPRINE HCL 10 MG PO TABS
10.0000 mg | ORAL_TABLET | Freq: Once | ORAL | Status: AC
  Administered 2018-05-12: 10 mg via ORAL
  Filled 2018-05-12: qty 1

## 2018-05-12 MED ORDER — INSULIN GLARGINE 100 UNIT/ML ~~LOC~~ SOLN
45.0000 [IU] | Freq: Every day | SUBCUTANEOUS | Status: DC
  Administered 2018-05-12 – 2018-05-13 (×2): 45 [IU] via SUBCUTANEOUS
  Filled 2018-05-12 (×2): qty 0.45

## 2018-05-12 MED ORDER — CYCLOBENZAPRINE HCL 10 MG PO TABS: 10.0000 mg | ORAL_TABLET | Freq: Three times a day (TID) | ORAL | Status: DC | PRN

## 2018-05-12 MED ORDER — TOPIRAMATE 25 MG PO TABS
25.0000 mg | ORAL_TABLET | Freq: Every day | ORAL | Status: DC
  Administered 2018-05-12: 25 mg via ORAL
  Filled 2018-05-12 (×2): qty 1

## 2018-05-12 MED ORDER — POTASSIUM CHLORIDE CRYS ER 20 MEQ PO TBCR
40.0000 meq | EXTENDED_RELEASE_TABLET | Freq: Two times a day (BID) | ORAL | Status: DC
  Administered 2018-05-12 – 2018-05-13 (×3): 40 meq via ORAL
  Filled 2018-05-12 (×3): qty 2

## 2018-05-12 MED ORDER — MONTELUKAST SODIUM 10 MG PO TABS
10.0000 mg | ORAL_TABLET | Freq: Every day | ORAL | Status: DC
  Administered 2018-05-12 – 2018-05-13 (×2): 10 mg via ORAL
  Filled 2018-05-12 (×2): qty 1

## 2018-05-12 MED ORDER — LISINOPRIL-HYDROCHLOROTHIAZIDE 20-25 MG PO TABS: 1.0000 | ORAL_TABLET | Freq: Every day | ORAL | Status: DC

## 2018-05-12 MED ORDER — IPRATROPIUM-ALBUTEROL 0.5-2.5 (3) MG/3ML IN SOLN: 3.0000 mL | Freq: Four times a day (QID) | RESPIRATORY_TRACT | Status: DC | PRN

## 2018-05-12 MED ORDER — GABAPENTIN 400 MG PO CAPS
1200.0000 mg | ORAL_CAPSULE | Freq: Every day | ORAL | Status: DC
  Administered 2018-05-12: 1200 mg via ORAL
  Filled 2018-05-12: qty 3

## 2018-05-12 MED ORDER — INSULIN ASPART 100 UNIT/ML ~~LOC~~ SOLN
0.0000 [IU] | Freq: Three times a day (TID) | SUBCUTANEOUS | Status: DC
  Administered 2018-05-12: 3 [IU] via SUBCUTANEOUS
  Administered 2018-05-12 – 2018-05-13 (×3): 1 [IU] via SUBCUTANEOUS

## 2018-05-12 MED ORDER — FLUTICASONE PROPIONATE 50 MCG/ACT NA SUSP
1.0000 | Freq: Every day | NASAL | Status: DC | PRN
  Administered 2018-05-12: 1 via NASAL
  Filled 2018-05-12: qty 16

## 2018-05-12 MED ORDER — HYDROCODONE-ACETAMINOPHEN 10-325 MG PO TABS
1.0000 | ORAL_TABLET | Freq: Four times a day (QID) | ORAL | Status: DC | PRN
  Administered 2018-05-12 – 2018-05-13 (×2): 1 via ORAL
  Filled 2018-05-12 (×2): qty 1

## 2018-05-12 MED ORDER — ONDANSETRON HCL 4 MG PO TABS: 4.0000 mg | ORAL_TABLET | Freq: Four times a day (QID) | ORAL | Status: DC | PRN

## 2018-05-12 MED ORDER — ACETAMINOPHEN 650 MG RE SUPP: 650.0000 mg | Freq: Four times a day (QID) | RECTAL | Status: DC | PRN

## 2018-05-12 MED ORDER — ESCITALOPRAM OXALATE 10 MG PO TABS
10.0000 mg | ORAL_TABLET | Freq: Every day | ORAL | Status: DC
  Administered 2018-05-12: 10 mg via ORAL
  Filled 2018-05-12: qty 1

## 2018-05-12 MED ORDER — LISINOPRIL 20 MG PO TABS
20.0000 mg | ORAL_TABLET | Freq: Every day | ORAL | Status: DC
  Administered 2018-05-12 – 2018-05-13 (×2): 20 mg via ORAL
  Filled 2018-05-12 (×2): qty 1

## 2018-05-12 MED ORDER — HYDROCHLOROTHIAZIDE 25 MG PO TABS: 25.0000 mg | ORAL_TABLET | Freq: Every day | ORAL | Status: DC

## 2018-05-12 MED ORDER — ACETAMINOPHEN 325 MG PO TABS: 650.0000 mg | ORAL_TABLET | Freq: Four times a day (QID) | ORAL | Status: DC | PRN

## 2018-05-12 MED ORDER — ONDANSETRON HCL 4 MG/2ML IJ SOLN: 4.0000 mg | Freq: Four times a day (QID) | INTRAMUSCULAR | Status: DC | PRN

## 2018-05-12 MED ORDER — GABAPENTIN 600 MG PO TABS
600.0000 mg | ORAL_TABLET | Freq: Every day | ORAL | Status: DC
  Administered 2018-05-12 – 2018-05-13 (×2): 600 mg via ORAL
  Filled 2018-05-12 (×2): qty 1

## 2018-05-12 MED ORDER — HYDROCODONE-ACETAMINOPHEN 10-325 MG PO TABS
1.0000 | ORAL_TABLET | Freq: Once | ORAL | Status: AC
  Administered 2018-05-12: 1 via ORAL
  Filled 2018-05-12: qty 1

## 2018-05-12 MED ORDER — FLUTICASONE FUROATE-VILANTEROL 100-25 MCG/INH IN AEPB
1.0000 | INHALATION_SPRAY | Freq: Every day | RESPIRATORY_TRACT | Status: DC
  Administered 2018-05-12 – 2018-05-13 (×2): 1 via RESPIRATORY_TRACT
  Filled 2018-05-12 (×2): qty 28

## 2018-05-12 MED ORDER — LEVOTHYROXINE SODIUM 112 MCG PO TABS
112.0000 ug | ORAL_TABLET | ORAL | Status: DC
  Administered 2018-05-12 – 2018-05-13 (×2): 112 ug via ORAL
  Filled 2018-05-12 (×2): qty 1

## 2018-05-12 NOTE — Progress Notes (Addendum)
Patient seen and examined this morning in the presence of RN , and significant other She had 2 small episodes of hemoptysis /Flecks of blood in her sputum this am and 5cc in the afternoon. Definitely improved since yesterday She is on room air, she continues to have coughing spells No wheezing on exam Plan is to continue  Observation to ensure  Complete resolution of hemoptysis , empiric antibiotics, Anticipate discharge tomorrow

## 2018-05-12 NOTE — Progress Notes (Signed)
Pt called staff to room to report an episode of Hemoptysis. Pt was observed coughing onto bed sheet with emesis bag next to her hand. Emsis bag handed to Pt . Pt had a small amount of bright red blood on sheets. Pt reports she was sleeping and woke up coughing. Page sent out to DR.Abrol.

## 2018-05-12 NOTE — H&P (Signed)
History and Physical    Marie Chen ZOX:[id number] DOB: [birth year] DOA: 05/11/2018  [facility]: [facility]  Patient coming from: Home.  Chief Complaint: Hemoptysis.  HPI: Marie Chen is a 57 y.o. female with history of asthma, sleep apnea, Sjogren's syndrome, diabetes mellitus type 2, hypothyroidism, hypertension presents to the ER because of hemoptysis.  Patient has had at least 3 episodes of hemoptysis since yesterday morning.  Denies any shortness of breath fever chills chest pain.  Has never had similar episodes previously does not take any antiplatelet agents or anticoagulants.  Has not travel outside [place].  ED Course: In the ER patient had chest x-ray and was evaluated by pulmonary critical care who advised to get CT angiogram of the chest and to admit for observation.  CT angiogram of the chest shows possible pneumonia for which patient has been placed on antibiotics.  Admitted for further observation.  Review of Systems: As per HPI, rest all negative.   Past Medical History:  Diagnosis Date  . Asthma   . Depression   . Diabetes mellitus without complication (HCC)   . GERD (gastroesophageal reflux disease)   . Hypertension   . Migraine   . Sjogren's disease (HCC)   . Sleep apnea    bipap  . Thyroid disease     Past Surgical History:  Procedure Laterality Date  . ANKLE SURGERY    . CARPAL TUNNEL RELEASE    . CHOLECYSTECTOMY    . HYSTEROSCOPY       reports that she has quit smoking. Her smoking use included cigarettes. She has never used smokeless tobacco. She reports that she does not drink alcohol or use drugs.  Allergies  Allergen Reactions  . Macrodantin [Nitrofurantoin Macrocrystal] Rash  . Metformin And Related Other (See Comments)    Causes yeast infections  . Hydromorphone Hcl Nausea And Vomiting  . Meperidine Hcl Nausea Only    Is ok if given with Phenergan  . Tetracyclines & Related Rash    Family History  Problem  Relation Age of Onset  . Heart failure Father   . Colon cancer Neg Hx     Prior to Admission medications   Medication Sig Start Date End Date Taking? Authorizing Provider  cyclobenzaprine (FLEXERIL) 10 MG tablet Take 10 mg by mouth every 8 (eight) hours as needed for muscle spasms.  04/14/18  Yes [provider]  escitalopram (LEXAPRO) 10 MG tablet Take 10 mg by mouth at bedtime.    Yes [provider]  fluticasone (FLONASE) 50 MCG/ACT nasal spray Place 1 spray into both nostrils daily as needed (seasonal allergies).    Yes [provider]  fluticasone furoate-vilanterol (BREO ELLIPTA) 100-25 MCG/INH AEPB Inhale 1 puff into the lungs daily.   Yes [provider]  gabapentin (NEURONTIN) 600 MG tablet Take 600-1,200 mg by mouth See admin instructions. Take one tablet (600 mg) by mouth every morning, take two tablets (1200 mg) at bedtime 04/16/18  Yes [provider]  HYDROcodone-acetaminophen (NORCO) 10-325 MG per tablet Take 1 tablet by mouth 4 (four) times daily as needed (pain).    Yes [provider]  insulin aspart (NOVOLOG FLEXPEN) 100 UNIT/ML FlexPen Inject 5-10 Units into the skin See admin instructions. Inject 5-10 units subcutaneously three time daily before meals per sliding scale: CBG up to 175 - 5 units, 175-200 7 units, >200 10 units   Yes [provider]  insulin glargine (LANTUS) 100 unit/mL SOPN Inject 45 Units  into the skin daily before breakfast.   Yes [provider]  ipratropium-albuterol (DUONEB) 0.5-2.5 (3) MG/3ML SOLN Take 3 mLs by nebulization every 6 (six) hours as needed (shortness of breath/wheezing).   Yes [provider]  levothyroxine (SYNTHROID, LEVOTHROID) 112 MCG tablet Take 112 mcg by mouth daily with breakfast.    Yes [provider]  lisinopril-hydrochlorothiazide (PRINZIDE,ZESTORETIC) 20-25 MG per tablet Take 1 tablet by mouth daily with breakfast.    Yes [provider]  montelukast (SINGULAIR) 10 MG tablet Take 10 mg by mouth daily with breakfast.   Yes [provider]  naproxen sodium (ALEVE) 220 MG tablet Take 440 mg by mouth 2 (two) times daily as needed (pain/headache).   Yes [provider]  terconazole (TERAZOL 7) 0.4 % vaginal cream Place 1 applicator vaginally at bedtime as needed (yeast infections).  10/25/17  Yes [provider]  topiramate (TOPAMAX) 25 MG tablet Take 25 mg by mouth at bedtime. 03/26/18  Yes [provider]  albuterol (PROVENTIL HFA;VENTOLIN HFA) 108 (90 BASE) MCG/ACT inhaler Inhale 2 puffs into the lungs every 6 (six) hours as needed for wheezing. Patient not taking: Reported on 05/11/2018 01/24/12 01/23/13  Lollie Sails, MD  glycopyrrolate (ROBINUL) 2 MG tablet TAKE ONE TABLET BY MOUTH TWICE DAILY Patient not taking: Reported on 05/11/2018 06/29/14   Sammuel Cooper, [name]    Physical Exam: Vitals:   05/11/18 2100 05/11/18 2115 05/11/18 2130 05/12/18 0038  BP: 113/70 120/70 111/81 108/77  Pulse: 95 99 98 96  Resp: (!) 21 17 15 17   Temp:    97.8 F (36.6 C)  TempSrc:    Oral  SpO2: 94% 94% 94% 92%  Weight:    (!) 141.8 kg  Height:    5\' 7"  (1.702 m)      Constitutional: Moderately built and nourished. Vitals:   05/11/18 2100 05/11/18 2115 05/11/18 2130 05/12/18 0038  BP: 113/70 120/70 111/81 108/77  Pulse: 95 99 98 96  Resp: (!) 21 17 15 17   Temp:    97.8 F (36.6 C)  TempSrc:    Oral  SpO2: 94% 94% 94% 92%  Weight:    (!) 141.8 kg  Height:    5\' 7"  (1.702 m)   Eyes: Anicteric no pallor. ENMT: No discharge from the ears eyes nose or mouth. Neck: No mass felt.  No neck rigidity.  No JVD appreciated. Respiratory: No rhonchi or crepitations. Cardiovascular: S1-S2 heard. Abdomen: Soft nontender bowel sounds present. Musculoskeletal: No edema. Skin: No rash. Neurologic: Alert awake oriented to time place and person.  Moves all extremities. Psychiatric: Appears normal per  normal affect.   Labs on Admission: I have personally reviewed following labs and imaging studies  CBC: Recent Labs  Lab 05/11/18 2003  WBC 10.1  NEUTROABS 6.4  HGB 12.9  HCT 44.2  MCV 85.3  PLT 341   Basic Metabolic Panel: Recent Labs  Lab 05/11/18 2003  NA 135  K 3.4*  CL 98  CO2 27  GLUCOSE 174*  BUN 12  CREATININE 1.00  CALCIUM 9.6   GFR: Estimated Creatinine Clearance: 89.6 mL/min (by C-G formula based on SCr of 1 mg/dL). Liver Function Tests: No results for input(s): AST, ALT, ALKPHOS, BILITOT, PROT, ALBUMIN in the last 168 hours. No results for input(s): LIPASE, AMYLASE in the last 168 hours. No results for input(s): AMMONIA in the last 168 hours. Coagulation Profile: Recent Labs  Lab 05/11/18 2003  INR 1.10   Cardiac  Enzymes: No results for input(s): CKTOTAL, CKMB, CKMBINDEX, TROPONINI in the last 168 hours. BNP (last 3 results) No results for input(s): PROBNP in the last 8760 hours. HbA1C: No results for input(s): HGBA1C in the last 72 hours. CBG: Recent Labs  Lab 05/11/18 2309  GLUCAP 173*   Lipid Profile: No results for input(s): CHOL, HDL, LDLCALC, TRIG, CHOLHDL, LDLDIRECT in the last 72 hours. Thyroid Function Tests: No results for input(s): TSH, T4TOTAL, FREET4, T3FREE, THYROIDAB in the last 72 hours. Anemia Panel: No results for input(s): VITAMINB12, FOLATE, FERRITIN, TIBC, IRON, RETICCTPCT in the last 72 hours. Urine analysis:    Component Value Date/Time   COLORURINE YELLOW 03/14/2009 1147   APPEARANCEUR CLEAR 03/14/2009 1147   LABSPEC 1.025 03/14/2009 1147   PHURINE 6.0 03/14/2009 1147   GLUCOSEU NEGATIVE 03/14/2009 1147   HGBUR NEGATIVE 12/17/2008 1500   BILIRUBINUR NEGATIVE 03/14/2009 1147   KETONESUR TRACE 03/14/2009 1147   PROTEINUR NEGATIVE 12/17/2008 1500   UROBILINOGEN 0.2 03/14/2009 1147   NITRITE NEGATIVE 03/14/2009 1147   LEUKOCYTESUR NEGATIVE 03/14/2009 1147   Sepsis  Labs: @LABRCNTIP (procalcitonin:4,lacticidven:4) )No results found for this or any previous visit (from the past 240 hour(s)).   Radiological Exams on Admission: Dg Chest 2 View  Result Date: 05/11/2018 CLINICAL DATA:  Hemoptysis. EXAM: CHEST - 2 VIEW COMPARISON:  Nov 06, 2017 FINDINGS: The heart size and mediastinal contours are within normal limits. Both lungs are clear. The visualized skeletal structures are unremarkable. IMPRESSION: No cause for hemoptysis identified.  Normal chest. Electronically Signed   By: Gerome Samavid  Williams III M.D   On: 05/11/2018 20:11   Ct Angio Chest Pe W/cm &/or Wo Cm  Result Date: 05/11/2018 CLINICAL DATA:  Hemoptysis.  Shortness of breath.  Left side pain. EXAM: CT ANGIOGRAPHY CHEST WITH CONTRAST TECHNIQUE: Multidetector CT imaging of the chest was performed using the standard protocol during bolus administration of intravenous contrast. Multiplanar CT image reconstructions and MIPs were obtained to evaluate the vascular anatomy. CONTRAST:  100mL ISOVUE-370 IOPAMIDOL (ISOVUE-370) INJECTION 76% COMPARISON:  Chest x-ray earlier today. FINDINGS: Cardiovascular: No filling defects in the pulmonary arteries to suggest pulmonary emboli. Scattered aortic and coronary artery calcifications. Heart is normal size. Aorta is normal caliber. Mediastinum/Nodes: No mediastinal, hilar, or axillary adenopathy. Small hiatal hernia. Lungs/Pleura: Airspace disease noted in the right lower lobe could reflect early pneumonia. No effusions. Linear atelectasis or scarring anteriorly in the lingula and right upper lobe. Upper Abdomen: Imaging into the upper abdomen shows no acute findings. Prior cholecystectomy. Musculoskeletal: Chest wall soft tissues are unremarkable. No acute bony abnormality. Review of the MIP images confirms the above findings. IMPRESSION: No evidence of pulmonary embolus. Airspace opacity in the posterior right lower lobe concerning for early pneumonia. Coronary artery disease,  aortic atherosclerosis. Electronically Signed   By: Charlett NoseKevin  Dover M.D.   On: 05/11/2018 22:58    EKG: Independently reviewed.  Normal sinus rhythm.  Assessment/Plan Principal Problem:   Cough with hemoptysis Active Problems:   Hypothyroidism   SLEEP APNEA, OBSTRUCTIVE   Essential hypertension   Hemoptysis   Diabetes mellitus type 2 in obese [facility](HCC)   Community acquired pneumonia    1. Hemoptysis with possible pneumonia with history of Sjogren's syndrome on empiric antibiotics for now.  Follow CBC.  If patient continues to have hemoptysis reconsult pulmonary critical care. 2. Diabetes mellitus type 2 on Lantus insulin with sliding scale coverage. 3. Hypertension on on lisinopril and hydrochlorothiazide. 4. Hypothyroidism on Synthroid. 5. Asthma not presently wheezing. 6. Sleep  apnea on CPAP. 7. Chronic pain on Norco and Flexeril.   DVT prophylaxis: SCDs. Code Status: Full code. Family Communication: Discussed with patient. Disposition Plan: Home. Consults called: Pulmonary critical care. Admission status: Observation.   Eduard Clos MD [facility] Pager [phone].  If 7PM-7AM, please contact night-coverage [email or web address] Password [facility]  05/12/2018, 3:26 AM

## 2018-05-13 DIAGNOSIS — I10 Essential (primary) hypertension: Secondary | ICD-10-CM | POA: Diagnosis not present

## 2018-05-13 DIAGNOSIS — J181 Lobar pneumonia, unspecified organism: Secondary | ICD-10-CM

## 2018-05-13 DIAGNOSIS — R042 Hemoptysis: Secondary | ICD-10-CM | POA: Diagnosis not present

## 2018-05-13 LAB — COMPREHENSIVE METABOLIC PANEL
ALT: 17 U/L (ref 0–44)
ANION GAP: 9 (ref 5–15)
AST: 27 U/L (ref 15–41)
Albumin: 2.9 g/dL — ABNORMAL LOW (ref 3.5–5.0)
Alkaline Phosphatase: 74 U/L (ref 38–126)
BUN: 11 mg/dL (ref 6–20)
CO2: 26 mmol/L (ref 22–32)
Calcium: 8.9 mg/dL (ref 8.9–10.3)
Chloride: 101 mmol/L (ref 98–111)
Creatinine, Ser: 0.65 mg/dL (ref 0.44–1.00)
GFR calc Af Amer: >60 mL/min (ref >60)
GFR calc non Af Amer: >60 mL/min (ref >60)
Glucose, Bld: 158 mg/dL — ABNORMAL HIGH (ref 70–99)
Potassium: 4.2 mmol/L (ref 3.5–5.1)
Sodium: 136 mmol/L (ref 135–145)
Total Bilirubin: 0.5 mg/dL (ref 0.3–1.2)
Total Protein: 6.8 g/dL (ref 6.5–8.1)

## 2018-05-13 LAB — CBC
HCT: 36.5 % (ref 36.0–46.0)
Hemoglobin: 10.7 g/dL — ABNORMAL LOW (ref 12.0–15.0)
MCH: 25.1 pg — ABNORMAL LOW (ref 26.0–34.0)
MCHC: 29.3 g/dL — ABNORMAL LOW (ref 30.0–36.0)
MCV: 85.5 fL (ref 80.0–100.0)
Platelets: 306 10*3/uL (ref 150–400)
RBC: 4.27 MIL/uL (ref 3.87–5.11)
RDW: 15.8 % — ABNORMAL HIGH (ref 11.5–15.5)
WBC: 8.4 10*3/uL (ref 4.0–10.5)
nRBC: 0 % (ref 0.0–0.2)

## 2018-05-13 LAB — GLUCOSE, CAPILLARY: Glucose-Capillary: 148 mg/dL — ABNORMAL HIGH (ref 70–99)

## 2018-05-13 LAB — LEGIONELLA PNEUMOPHILA SEROGP 1 UR AG: L. pneumophila Serogp 1 Ur Ag: NEGATIVE

## 2018-05-13 MED ORDER — AZITHROMYCIN 500 MG PO TABS: 500.0000 mg | ORAL_TABLET | Freq: Every day | ORAL | 0 refills | Status: AC

## 2018-05-13 MED ORDER — POTASSIUM CHLORIDE CRYS ER 20 MEQ PO TBCR: 40.0000 meq | EXTENDED_RELEASE_TABLET | Freq: Every day | ORAL | 0 refills | Status: DC

## 2018-05-13 MED ORDER — CEFDINIR 300 MG PO CAPS: 300.0000 mg | ORAL_CAPSULE | Freq: Two times a day (BID) | ORAL | 0 refills | Status: AC

## 2018-05-13 MED ORDER — LISINOPRIL-HYDROCHLOROTHIAZIDE 20-25 MG PO TABS: 1.0000 | ORAL_TABLET | Freq: Every day | ORAL | 0 refills | Status: DC

## 2018-05-13 NOTE — Discharge Summary (Signed)
Physician Discharge Summary  Marie Chen MRN: [id number] DOB/AGE: 59-24-1962 59 y.o.  PCP: [facility]   Admit date: 05/11/2018 Discharge date: 05/13/2018  Discharge Diagnoses:    Principal Problem:   Cough with hemoptysis Active Problems:   Hypothyroidism   SLEEP APNEA, OBSTRUCTIVE   Essential hypertension   Hemoptysis   Diabetes mellitus type 2 in obese [facility])   Community acquired pneumonia    Follow-up recommendations Follow-up with PCP in 3-5 days , including all  additional recommended appointments as below Follow-up CBC, CMP in 3-5 days patient to continue oral antibiotics to complete a total of 7 days of treatment Recommend outpatient rheumatology evaluation for Sjogren's disease       Allergies as of 05/13/2018      Reactions   Macrodantin [nitrofurantoin Macrocrystal] Rash   Metformin And Related Other (See Comments)   Causes yeast infections   Hydromorphone Hcl Nausea And Vomiting   Meperidine Hcl Nausea Only   Is ok if given with Phenergan   Tetracyclines & Related Rash      Medication List    STOP taking these medications   naproxen sodium 220 MG tablet Commonly known as:  ALEVE     TAKE these medications   albuterol 108 (90 Base) MCG/ACT inhaler Commonly known as:  PROVENTIL HFA;VENTOLIN HFA Inhale 2 puffs into the lungs every 6 (six) hours as needed for wheezing.   azithromycin 500 MG tablet Commonly known as:  ZITHROMAX Take 1 tablet (500 mg total) by mouth daily for 4 days.   BREO ELLIPTA 100-25 MCG/INH Aepb Generic drug:  fluticasone furoate-vilanterol Inhale 1 puff into the lungs daily.   cefdinir 300 MG capsule Commonly known as:  OMNICEF Take 1 capsule (300 mg total) by mouth 2 (two) times daily for 5 days.   cyclobenzaprine 10 MG tablet Commonly known as:  FLEXERIL Take 10 mg by mouth every 8 (eight) hours as needed for muscle spasms.   escitalopram 10 MG tablet Commonly known as:  LEXAPRO Take 10 mg by  mouth at bedtime.   fluticasone 50 MCG/ACT nasal spray Commonly known as:  FLONASE Place 1 spray into both nostrils daily as needed (seasonal allergies).   gabapentin 600 MG tablet Commonly known as:  NEURONTIN Take 600-1,200 mg by mouth See admin instructions. Take one tablet (600 mg) by mouth every morning, take two tablets (1200 mg) at bedtime   glycopyrrolate 2 MG tablet Commonly known as:  ROBINUL TAKE ONE TABLET BY MOUTH TWICE DAILY   HYDROcodone-acetaminophen 10-325 MG tablet Commonly known as:  NORCO Take 1 tablet by mouth 4 (four) times daily as needed (pain).   insulin glargine 100 unit/mL Sopn Commonly known as:  LANTUS Inject 45 Units into the skin daily before breakfast.   ipratropium-albuterol 0.5-2.5 (3) MG/3ML Soln Commonly known as:  DUONEB Take 3 mLs by nebulization every 6 (six) hours as needed (shortness of breath/wheezing).   levothyroxine 112 MCG tablet Commonly known as:  SYNTHROID, LEVOTHROID Take 112 mcg by mouth daily with breakfast.   lisinopril-hydrochlorothiazide 20-25 MG tablet Commonly known as:  PRINZIDE,ZESTORETIC Take 1 tablet by mouth daily with breakfast. Start taking on:  05/16/2018 What changed:  These instructions start on 05/16/2018. If you are unsure what to do until then, ask your doctor or other care provider.   montelukast 10 MG tablet Commonly known as:  SINGULAIR Take 10 mg by mouth daily with breakfast.   NOVOLOG FLEXPEN 100 UNIT/ML FlexPen Generic drug:  insulin aspart Inject 5-10  Units into the skin See admin instructions. Inject 5-10 units subcutaneously three time daily before meals per sliding scale: CBG up to 175 - 5 units, 175-200 7 units, >200 10 units   potassium chloride SA 20 MEQ tablet Commonly known as:  K-DUR,KLOR-CON Take 2 tablets (40 mEq total) by mouth daily.   terconazole 0.4 % vaginal cream Commonly known as:  TERAZOL 7 Place 1 applicator vaginally at bedtime as needed (yeast infections).   topiramate  25 MG tablet Commonly known as:  TOPAMAX Take 25 mg by mouth at bedtime.        Discharge Condition:  stable  Discharge Instructions Get Medicines reviewed and adjusted: Please take all your medications with you for your next visit with your Primary MD  Please request your Primary MD to go over all hospital tests and procedure/radiological results at the follow up, please ask your Primary MD to get all Hospital records sent to his/her office.  If you experience worsening of your admission symptoms, develop shortness of breath, life threatening emergency, suicidal or homicidal thoughts you must seek medical attention immediately by calling 911 or calling your MD immediately  if symptoms less severe.  You must read complete instructions/literature along with all the possible adverse reactions/side effects for all the Medicines you take and that have been prescribed to you. Take any new Medicines after you have completely understood and accpet all the possible adverse reactions/side effects.   Do not drive when taking Pain medications.   Do not take more than prescribed Pain, Sleep and Anxiety Medications  Special Instructions: If you have smoked or chewed Tobacco  in the last 2 yrs please stop smoking, stop any regular Alcohol  and or any Recreational drug use.  Wear Seat belts while driving.  Please note  You were cared for by a hospitalist during your hospital stay. Once you are discharged, your primary care physician will handle any further medical issues. Please note that NO REFILLS for any discharge medications will be authorized once you are discharged, as it is imperative that you return to your primary care physician (or establish a relationship with a primary care physician if you do not have one) for your aftercare needs so that they can reassess your need for medications and monitor your lab values.     Allergies  Allergen Reactions  . Macrodantin [Nitrofurantoin  Macrocrystal] Rash  . Metformin And Related Other (See Comments)    Causes yeast infections  . Hydromorphone Hcl Nausea And Vomiting  . Meperidine Hcl Nausea Only    Is ok if given with Phenergan  . Tetracyclines & Related Rash      Disposition: Discharge disposition: 01-Home or Self Care        Consults  pulmonary    Significant Diagnostic Studies:  Dg Chest 2 View  Result Date: 05/11/2018 CLINICAL DATA:  Hemoptysis. EXAM: CHEST - 2 VIEW COMPARISON:  Nov 06, 2017 FINDINGS: The heart size and mediastinal contours are within normal limits. Both lungs are clear. The visualized skeletal structures are unremarkable. IMPRESSION: No cause for hemoptysis identified.  Normal chest. Electronically Signed   By: Gerome Samavid  Williams III M.D   On: 05/11/2018 20:11   Ct Angio Chest Pe W/cm &/or Wo Cm  Result Date: 05/11/2018 CLINICAL DATA:  Hemoptysis.  Shortness of breath.  Left side pain. EXAM: CT ANGIOGRAPHY CHEST WITH CONTRAST TECHNIQUE: Multidetector CT imaging of the chest was performed using the standard protocol during bolus administration of intravenous contrast. Multiplanar CT  image reconstructions and MIPs were obtained to evaluate the vascular anatomy. CONTRAST:  ISOVUE-370 IOPAMIDOL (ISOVUE-370) INJECTION 76% COMPARISON:  Chest x-ray earlier today. FINDINGS: Cardiovascular: No filling defects in the pulmonary arteries to suggest pulmonary emboli. Scattered aortic and coronary artery calcifications. Heart is normal size. Aorta is normal caliber. Mediastinum/Nodes: No mediastinal, hilar, or axillary adenopathy. Small hiatal hernia. Lungs/Pleura: Airspace disease noted in the right lower lobe could reflect early pneumonia. No effusions. Linear atelectasis or scarring anteriorly in the lingula and right upper lobe. Upper Abdomen: Imaging into the upper abdomen shows no acute findings. Prior cholecystectomy. Musculoskeletal: Chest wall soft tissues are unremarkable. No acute bony  abnormality. Review of the MIP images confirms the above findings. IMPRESSION: No evidence of pulmonary embolus. Airspace opacity in the posterior right lower lobe concerning for early pneumonia. Coronary artery disease, aortic atherosclerosis. Electronically Signed   By: Charlett Nose M.D.   On: 05/11/2018 22:58      Filed Weights   05/12/18 0038  Weight: (!) 141.8 kg     Microbiology: No results found for this or any previous visit (from the past 240 hour(s)).     Blood Culture    Component Value Date/Time   SDES URINE, RANDOM 12/17/2008 1532   SPECREQUEST NONE 12/17/2008 1532   CULT  12/17/2008 1532    Multiple bacterial morphotypes present, none predominant. Suggest appropriate recollection if clinically indicated.   REPTSTATUS 12/19/2008 FINAL 12/17/2008 1532      Labs: Results for orders placed or performed during the hospital encounter of 05/11/18 (from the past 48 hour(s))  Basic metabolic panel     Status: Abnormal   Collection Time: 05/11/18  8:03 PM  Result Value Ref Range   Sodium 135 135 - 145 mmol/L   Potassium 3.4 (L) 3.5 - 5.1 mmol/L   Chloride 98 98 - 111 mmol/L   CO2 27 22 - 32 mmol/L   Glucose, Bld 174 (H) 70 - 99 mg/dL   BUN 12 6 - 20 mg/dL   Creatinine, Ser 1.61 0.44 - 1.00 mg/dL   Calcium 9.6 8.9 - 09.6 mg/dL   GFR calc non Af Amer >60 >60 mL/min   GFR calc Af Amer >60 >60 mL/min   Anion gap 10 5 - 15    Comment: Performed at [facility], [address] [place] [address] [place], [place] [address]  CBC with Differential     Status: Abnormal   Collection Time: 05/11/18  8:03 PM  Result Value Ref Range   WBC 10.1 4.0 - 10.5 K/uL   RBC 5.18 (H) 3.87 - 5.11 MIL/uL   Hemoglobin 12.9 12.0 - 15.0 g/dL   HCT 98.1 19.1 - 47.8 %   MCV 85.3 80.0 - 100.0 fL   MCH 24.9 (L) 26.0 - 34.0 pg   MCHC 29.2 (L) 30.0 - 36.0 g/dL   RDW 29.5 (H) 62.1 - 30.8 %   Platelets 341 150 - 400 K/uL   nRBC 0.0 0.0 - 0.2 %   Neutrophils Relative % 65 %   Neutro Abs 6.4 1.7 - 7.7  K/uL   Lymphocytes Relative 27 %   Lymphs Abs 2.7 0.7 - 4.0 K/uL   Monocytes Relative 6 %   Monocytes Absolute 0.6 0.1 - 1.0 K/uL   Eosinophils Relative 2 %   Eosinophils Absolute 0.2 0.0 - 0.5 K/uL   Basophils Relative 0 %   Basophils Absolute 0.0 0.0 - 0.1 K/uL   Immature Granulocytes 0 %   Abs Immature  Granulocytes 0.03 0.00 - 0.07 K/uL    Comment: Performed at [facility], [address] [place], [place] [address]  Protime-INR     Status: None   Collection Time: 05/11/18  8:03 PM  Result Value Ref Range   Prothrombin Time 14.1 11.4 - 15.2 seconds   INR 1.10     Comment: Performed at [facility], [address] [place], [place] [address]  APTT     Status: None   Collection Time: 05/11/18  8:03 PM  Result Value Ref Range   aPTT 34 24 - 36 seconds    Comment: Performed at [facility], [address], [place], [place] [address]  Type and screen [facility]     Status: None   Collection Time: 05/11/18  8:03 PM  Result Value Ref Range   ABO/RH(D) O POS    Antibody Screen NEG    Sample Expiration      05/14/2018 Performed at [facility], [address] [place], [place] [address]   ABO/Rh     Status: None   Collection Time: 05/11/18  8:03 PM  Result Value Ref Range   ABO/RH(D)      O POS Performed at [facility], [address], [place], [place] [address]   Influenza panel by PCR (type A & B)     Status: None   Collection Time: 05/11/18 10:33 PM  Result Value Ref Range   Influenza A By PCR NEGATIVE NEGATIVE   Influenza B By PCR NEGATIVE NEGATIVE    Comment: (NOTE) The Xpert Xpress Flu assay is intended as an aid in the diagnosis of  influenza and should not be used as a sole basis for treatment.  This  assay is FDA approved for nasopharyngeal swab specimens only. Nasal  washings and aspirates are unacceptable for Xpert Xpress Flu testing. Performed at [facility], [address], [place], [place] [address]    CBG monitoring, ED     Status: Abnormal   Collection Time: 05/11/18 11:09 PM  Result Value Ref Range   Glucose-Capillary 173 (H) 70 - 99 mg/dL  Strep pneumoniae urinary antigen     Status: None   Collection Time: 05/12/18  3:28 AM  Result Value Ref Range   Strep Pneumo Urinary Antigen NEGATIVE NEGATIVE    Comment:        Infection due to S. pneumoniae cannot be absolutely ruled out since the antigen present may be below the detection limit of the test.   HIV antibody (Routine Testing)     Status: None   Collection Time: 05/12/18  5:08 AM  Result Value Ref Range   HIV Screen 4th Generation wRfx Non Reactive Non Reactive    Comment: (NOTE) Performed At: [facility] [address] [place], [place] [id number] Jolene Schimke MD BM:[id number]   Basic metabolic panel     Status: Abnormal   Collection Time: 05/12/18  5:08 AM  Result Value Ref Range   Sodium 136 135 - 145 mmol/L   Potassium 3.2 (L) 3.5 - 5.1 mmol/L   Chloride 100 98 - 111 mmol/L   CO2 25 22 - 32 mmol/L   Glucose, Bld 149 (H) 70 - 99 mg/dL   BUN 12 6 - 20 mg/dL   Creatinine, Ser 2.72 0.44 - 1.00 mg/dL   Calcium 8.9 8.9 - 53.6 mg/dL   GFR calc non Af Amer >60 >60 mL/min  GFR calc Af Amer >60 >60 mL/min   Anion gap 11 5 - 15    Comment: Performed at [facility], [address], [place], [place] [address]  CBC     Status: Abnormal   Collection Time: 05/12/18  5:08 AM  Result Value Ref Range   WBC 9.1 4.0 - 10.5 K/uL   RBC 4.37 3.87 - 5.11 MIL/uL   Hemoglobin 11.0 (L) 12.0 - 15.0 g/dL   HCT 24.4 01.0 - 27.2 %   MCV 84.9 80.0 - 100.0 fL   MCH 25.2 (L) 26.0 - 34.0 pg   MCHC 29.6 (L) 30.0 - 36.0 g/dL   RDW 53.6 64.4 - 03.4 %   Platelets 309 150 - 400 K/uL   nRBC 0.0 0.0 - 0.2 %    Comment: Performed at [facility], [address] [place], [place] [address]  Magnesium     Status: Abnormal   Collection Time: 05/12/18  5:08 AM  Result Value Ref Range   Magnesium 1.6 (L) 1.7 - 2.4 mg/dL     Comment: Performed at [facility], [address], [place], [place] [address]  Glucose, capillary     Status: Abnormal   Collection Time: 05/12/18  8:39 AM  Result Value Ref Range   Glucose-Capillary 165 (H) 70 - 99 mg/dL  Glucose, capillary     Status: Abnormal   Collection Time: 05/12/18 11:20 AM  Result Value Ref Range   Glucose-Capillary 134 (H) 70 - 99 mg/dL  Glucose, capillary     Status: Abnormal   Collection Time: 05/12/18  4:21 PM  Result Value Ref Range   Glucose-Capillary 124 (H) 70 - 99 mg/dL  Glucose, capillary     Status: Abnormal   Collection Time: 05/12/18 10:04 PM  Result Value Ref Range   Glucose-Capillary 172 (H) 70 - 99 mg/dL  Glucose, capillary     Status: Abnormal   Collection Time: 05/13/18  6:29 AM  Result Value Ref Range   Glucose-Capillary 148 (H) 70 - 99 mg/dL  Comprehensive metabolic panel     Status: Abnormal   Collection Time: 05/13/18  7:23 AM  Result Value Ref Range   Sodium 136 135 - 145 mmol/L   Potassium 4.2 3.5 - 5.1 mmol/L   Chloride 101 98 - 111 mmol/L   CO2 26 22 - 32 mmol/L   Glucose, Bld 158 (H) 70 - 99 mg/dL   BUN 11 6 - 20 mg/dL   Creatinine, Ser 5.64 0.44 - 1.00 mg/dL   Calcium 8.9 8.9 - 33.2 mg/dL   Total Protein 6.8 6.5 - 8.1 g/dL   Albumin 2.9 (L) 3.5 - 5.0 g/dL   AST 27 15 - 41 U/L   ALT 17 0 - 44 U/L   Alkaline Phosphatase 74 38 - 126 U/L   Total Bilirubin 0.5 0.3 - 1.2 mg/dL   GFR calc non Af Amer >60 >60 mL/min   GFR calc Af Amer >60 >60 mL/min   Anion gap 9 5 - 15    Comment: Performed at [facility], [address], [place], [place] [address]     Lipid Panel     Component Value Date/Time   CHOL 220 (H) 03/14/2009 1147   TRIG 128.0 03/14/2009 1147   HDL 38.50 (L) 03/14/2009 1147   CHOLHDL 6 03/14/2009 1147   VLDL 25.6 03/14/2009 1147   LDLCALC 96 08/31/2008 1421   LDLDIRECT 179.5 03/14/2009 1147  Lab Results  Component Value Date   HGBA1C 6.4 03/14/2009   HGBA1C 6.4 08/31/2008    HGBA1C 6.2 (H) 03/04/2008     Lab Results  Component Value Date   MICROALBUR 0.7 03/14/2009   LDLCALC 96 08/31/2008   CREATININE 0.65 05/13/2018     HPI   Marie Chen is a 57 y.o. female with history of asthma, sleep apnea, Sjogren's syndrome, diabetes mellitus type 2, hypothyroidism, hypertension presents to the ER because of hemoptysis.  Patient has had at least 3 episodes of hemoptysis since yesterday morning.  Denies any shortness of breath fever chills chest pain.  Has never had similar episodes previously does not take any antiplatelet agents or anticoagulants.  Has not travel outside [place].  ED Course: In the ER patient had chest x-ray and was evaluated by pulmonary critical care who advised to get CT angiogram of the chest and to admit for observation.  CT angiogram of the chest shows possible pneumonia for which patient has been placed on antibiotics.  Admitted for further observation.  HOSPITAL COURSE   1. Hemoptysis with possible pneumonia with history of Sjogren's syndrome started on empiric treatment with Rocephin and azithromycin.  patient seen by pulmonary/critical care. Given her risk of Sjogren's she is at risk of interstitial lung disease and bronchiectasis. CT chest showed no evidence of PE, airspace opacity in the posterior right lower lobe concerning for early pneumonia. Patient will continue with antibiotics for a total of 7 days . Influenza PCR negative, HIV nonreactive, strep pneumococcal antigen negative . Blood cultures no growth so far 2. Diabetes mellitus type 2 on Lantus   3. Hypertension on on lisinopril and hydrochlorothiazide.advised to resume in the next 48 hours. Given hypokalemia  She has been started on low-dose potassium supplement 4. Hypothyroidism on Synthroid. 5. Asthma not presently wheezing. 6. Sleep apnea on CPAP. 7. Chronic pain on Norco and Flexeril.  Discharge Exam:  Blood pressure 134/83, pulse 73, temperature 98 F (36.7 C),  temperature source Oral, resp. rate 20, height 5\' 7"  (1.702 m), weight (!) 141.8 kg, SpO2 96 %.  Cardiovascular: S1-S2 heard. Abdomen: Soft nontender bowel sounds present. Musculoskeletal: No edema. Skin: No rash. Neurologic: Alert awake oriented to time place and person.  Moves all extremities. Psychiatric: Appears normal per normal affect.     Follow-up Information    [facility]. Call.   Specialty:  Family Medicine Why:  Hospital follow-up in 3-5 days Contact information: [address] [place] [address] [phone]           Signed: Richarda Overlie 05/13/2018, 11:02 AM      Time needed to  prepare  discharge, discussed with the patient and family 35 minutes

## 2018-05-13 NOTE — Care Management Obs Status (Signed)
MEDICARE OBSERVATION STATUS NOTIFICATION   Patient Details  Name: Marie Chen MRN: [id number] Date of Birth: 10/29/1960   Medicare Observation Status Notification Given:  Yes    Colleen CanNatalie Quintessa Simmerman RN, BSN, NCM-BC, ACM-RN [phone] 05/13/2018, 11:14 AM

## 2018-05-17 LAB — CULTURE, BLOOD (ROUTINE X 2)
Culture: NO GROWTH
Culture: NO GROWTH
Special Requests: ADEQUATE
Special Requests: ADEQUATE

## 2018-08-25 DIAGNOSIS — E559 Vitamin D deficiency, unspecified: Secondary | ICD-10-CM

## 2018-08-25 HISTORY — DX: Vitamin D deficiency, unspecified: E55.9

## 2018-12-04 ENCOUNTER — Ambulatory Visit (INDEPENDENT_AMBULATORY_CARE_PROVIDER_SITE_OTHER): Payer: Medicare HMO | Admitting: Cardiology

## 2018-12-04 ENCOUNTER — Encounter: Payer: Self-pay | Admitting: Cardiology

## 2018-12-04 ENCOUNTER — Other Ambulatory Visit: Payer: Self-pay

## 2018-12-04 VITALS — BP 132/76 | HR 73 | Ht 67.0 in | Wt 325.0 lb

## 2018-12-04 DIAGNOSIS — I10 Essential (primary) hypertension: Secondary | ICD-10-CM | POA: Diagnosis not present

## 2018-12-04 DIAGNOSIS — I209 Angina pectoris, unspecified: Secondary | ICD-10-CM

## 2018-12-04 DIAGNOSIS — E782 Mixed hyperlipidemia: Secondary | ICD-10-CM | POA: Diagnosis not present

## 2018-12-04 DIAGNOSIS — E1169 Type 2 diabetes mellitus with other specified complication: Secondary | ICD-10-CM | POA: Diagnosis not present

## 2018-12-04 DIAGNOSIS — E669 Obesity, unspecified: Secondary | ICD-10-CM

## 2018-12-04 HISTORY — DX: Angina pectoris, unspecified: I20.9

## 2018-12-04 HISTORY — DX: Morbid (severe) obesity due to excess calories: E66.01

## 2018-12-04 MED ORDER — ASPIRIN EC 81 MG PO TBEC: 81.0000 mg | DELAYED_RELEASE_TABLET | Freq: Every day | ORAL | 3 refills | Status: DC

## 2018-12-04 MED ORDER — NITROGLYCERIN 0.4 MG SL SUBL: 0.4000 mg | SUBLINGUAL_TABLET | SUBLINGUAL | 3 refills | Status: DC | PRN

## 2018-12-04 NOTE — Progress Notes (Signed)
Cardiology Office Note:    Date:  12/04/2018   ID:  Marie Chen, DOB 02/11/1961, MRN [id number]  PCP:  [facility]  Cardiologist:  Kirstein Baxley R Destenie Ingber, MD   Referring MD: [facility]    ASSESSMENT:    1. Angina pectoris (HCC)   2. Essential hypertension   3. Diabetes mellitus type 2 in obese (HCC)   4. Mixed dyslipidemia   5. Morbid obesity (HCC)    PLAN:    In order of problems listed above:  1. Angina pectoris: Patient has multiple risk factors for coronary artery disease.her symptoms are very concerning.  In view of the patient's symptoms, I discussed with the patient options for evaluation. Invasive and noninvasive options were given to the patient. I discussed stress testing and coronary angiography and left heart catheterization at length. Benefits, pros and cons of each approach were discussed at length. Patient had multiple questions which were answered to the patient's satisfaction. Patient opted for invasive evaluation and we will set up for coronary angiography and left heart catheterization. Further recommendations will be made based on the findings with coronary angiography. In the interim if the patient has any significant symptoms in hospital to the nearest emergency room.  Sublingual nitroglycerin prescription was sent, its protocol and 911 protocol explained and the patient vocalized understanding questions were answered to the patient's satisfaction.  Patient was also advised to take a coated 81 mg of aspirin on a daily basis. 2. Essential hypertension: Blood pressure stable. 3. Diabetes mellitus: Diet was discussed for diabetes mellitus dyslipidemia and obesity.  Risks explained and she vocalized understanding.  She promises to comply. 4. Mixed dyslipidemia: As above 5. Morbid obesity: As above  Medication Adjustments/Labs and Tests Ordered: Current medicines are reviewed at length with the patient today.  Concerns regarding medicines are  outlined above.  No orders of the defined types were placed in this encounter.  No orders of the defined types were placed in this encounter.    History of Present Illness:    Marie Chen is a 57 y.o. female who is being seen today for the evaluation of angina pectoris at the request of [facility].  Patient is a pleasant 57-year-old female.  She has past medical history of essential hypertension, dyslipidemia, diabetes mellitus and morbid obesity.  She mentions to me that in the past couple of weeks she is having chest tightness on exertion.  This appears to happen on exertion substernally and radiates to the neck and the jaw and the arms.  This happens consistently on exertion and rest makes it better.  This also comes on with sexual activity to the point that sometimes she has to stop and she feels better.  At the time of my evaluation, the patient is alert awake oriented and in no distress.  Past Medical History:  Diagnosis Date  . Asthma   . Depression   . Diabetes mellitus without complication (HCC)   . GERD (gastroesophageal reflux disease)   . Hypertension   . Migraine   . Sjogren's disease (HCC)   . Sleep apnea    bipap  . Thyroid disease     Past Surgical History:  Procedure Laterality Date  . ANKLE SURGERY    . CARPAL TUNNEL RELEASE    . CHOLECYSTECTOMY    . HYSTEROSCOPY      Current Medications: Current Meds  Medication Sig  . atorvastatin (LIPITOR) 40 MG tablet Take 40 mg by mouth daily.  .   cyclobenzaprine (FLEXERIL) 10 MG tablet Take 10 mg by mouth every 8 (eight) hours as needed for muscle spasms.   Marland Kitchen escitalopram (LEXAPRO) 10 MG tablet Take 10 mg by mouth at bedtime.   . fluticasone (FLONASE) 50 MCG/ACT nasal spray Place 1 spray into both nostrils daily as needed (seasonal allergies).   . fluticasone furoate-vilanterol (BREO ELLIPTA) 100-25 MCG/INH AEPB Inhale 1 puff into the lungs daily.  Marland Kitchen glycopyrrolate (ROBINUL) 2 MG tablet TAKE ONE  TABLET BY MOUTH TWICE DAILY  . HYDROcodone-acetaminophen (NORCO) 10-325 MG per tablet Take 1 tablet by mouth 4 (four) times daily as needed (pain).   . insulin aspart (NOVOLOG FLEXPEN) 100 UNIT/ML FlexPen Inject 5-10 Units into the skin See admin instructions. Inject 5-10 units subcutaneously three time daily before meals per sliding scale: CBG up to 175 - 5 units, 175-200 7 units, >200 10 units  . insulin glargine (LANTUS) 100 unit/mL SOPN Inject 69 Units into the skin daily before breakfast.   . ipratropium-albuterol (DUONEB) 0.5-2.5 (3) MG/3ML SOLN Take 3 mLs by nebulization every 6 (six) hours as needed (shortness of breath/wheezing).  Marland Kitchen levothyroxine (SYNTHROID, LEVOTHROID) 112 MCG tablet Take 112 mcg by mouth daily with breakfast.   . lisinopril-hydrochlorothiazide (PRINZIDE,ZESTORETIC) 20-25 MG tablet Take 1 tablet by mouth daily with breakfast.  . montelukast (SINGULAIR) 10 MG tablet Take 10 mg by mouth daily with breakfast.  . potassium chloride SA (K-DUR,KLOR-CON) 20 MEQ tablet Take 2 tablets (40 mEq total) by mouth daily.  . pregabalin (LYRICA) 100 MG capsule Take 100 mg by mouth 3 (three) times daily.  Marland Kitchen terconazole (TERAZOL 7) 0.4 % vaginal cream Place 1 applicator vaginally at bedtime as needed (yeast infections).   . topiramate (TOPAMAX) 25 MG tablet Take 25 mg by mouth at bedtime.     Allergies:   Macrodantin [nitrofurantoin macrocrystal], Metformin and related, Hydromorphone hcl, Meperidine hcl, and Tetracyclines & related   Social History   Socioeconomic History  . Marital status: Widowed    Spouse name: Not on file  . Number of children: 4  . Years of education: Not on file  . Highest education level: Not on file  Occupational History  . Occupation: CSR    Employer: [employer]  Social Needs  . Financial resource strain: Not on file  . Food insecurity    Worry: Not on file    Inability: Not on file  . Transportation needs    Medical: Not on file    Non-medical: Not  on file  Tobacco Use  . Smoking status: Former Smoker    Types: Cigarettes  . Smokeless tobacco: Never Used  Substance and Sexual Activity  . Alcohol use: No    Alcohol/week: 0.0 standard drinks  . Drug use: No  . Sexual activity: Not on file  Lifestyle  . Physical activity    Days per week: Not on file    Minutes per session: Not on file  . Stress: Not on file  Relationships  . Social Herbalist on phone: Not on file    Gets together: Not on file    Attends religious service: Not on file    Active member of club or organization: Not on file    Attends meetings of clubs or organizations: Not on file    Relationship status: Not on file  Other Topics Concern  . Not on file  Social History Narrative  . Not on file     Family History: The patient's family history  includes Heart failure in her father. There is no history of Colon cancer.  ROS:   Please see the history of present illness.    All other systems reviewed and are negative.  EKGs/Labs/Other Studies Reviewed:    The following studies were reviewed today: EKG reveals sinus rhythm and nonspecific ST-T changes   Recent Labs: 05/12/2018: Magnesium 1.6 05/13/2018: ALT 17; BUN 11; Creatinine, Ser 0.65; Hemoglobin 10.7; Platelets 306; Potassium 4.2; Sodium 136  Recent Lipid Panel    Component Value Date/Time   CHOL 220 (H) 03/14/2009 1147   TRIG 128.0 03/14/2009 1147   HDL 38.50 (L) 03/14/2009 1147   CHOLHDL 6 03/14/2009 1147   VLDL 25.6 03/14/2009 1147   LDLCALC 96 08/31/2008 1421   LDLDIRECT 179.5 03/14/2009 1147    Physical Exam:    VS:  BP 132/76 (BP Location: Left Arm, Patient Position: Sitting, Cuff Size: Normal)   Pulse 73   Ht 5\' 7"  (1.702 m)   Wt (!) 325 lb (147.4 kg)   SpO2 97%   BMI 50.90 kg/m     Wt Readings from Last 3 Encounters:  12/04/18 (!) 325 lb (147.4 kg)  05/12/18 (!) 312 lb 9.8 oz (141.8 kg)  04/19/14 (!) 322 lb (146.1 kg)     GEN: Patient is in no acute distress  HEENT: Normal NECK: No JVD; No carotid bruits LYMPHATICS: No lymphadenopathy CARDIAC: S1 S2 regular, 2/6 systolic murmur at the apex. RESPIRATORY:  Clear to auscultation without rales, wheezing or rhonchi  ABDOMEN: Soft, non-tender, non-distended MUSCULOSKELETAL:  No edema; No deformity  SKIN: Warm and dry NEUROLOGIC:  Alert and oriented x 3 PSYCHIATRIC:  Normal affect    Signed, Garwin Brothersajan R Sherial Ebrahim, MD  12/04/2018 9:52 AM    [facility]

## 2018-12-04 NOTE — Patient Instructions (Addendum)
Medication Instructions:  Your physician has recommended you make the following change in your medication:  START taking aspirin 81 mg (1 tablet) once daily  START taking nitroglycerin as needed for chest pain When having chest pain, stop what you are doing and sit down. Take 1 nitro, wait 5 minutes. Still having chest pain, take 1 nitro, wait 5 minutes. Still having chest pain, take 1 nitro, dial 911. Total of 3 nitro in 15 minutes.  If you need a refill on your cardiac medications before your next appointment, please call your pharmacy.   Lab work: You will need to have a CBC and BMP performed. SEND copy of results to [phone]   If you have labs (blood work) drawn today and your tests are completely normal, you will receive your results only by: Marland Kitchen. MyChart Message (if you have MyChart) OR . A paper copy in the mail If you have any lab test that is abnormal or we need to change your treatment, we will call you to review the results.  Testing/Procedures: Your physician has requested that you have a cardiac catheterization. Cardiac catheterization is used to diagnose and/or treat various heart conditions. Doctors may recommend this procedure for a number of different reasons. The most common reason is to evaluate chest pain. Chest pain can be a symptom of coronary artery disease (CAD), and cardiac catheterization can show whether plaque is narrowing or blocking your heart's arteries. This procedure is also used to evaluate the valves, as well as measure the blood flow and oxygen levels in different parts of your heart. For further information please visit [email or web address]. Please follow instruction sheet, as given.      [facility] [place] [address] [place] [id number] Dept: [phone] Loc: [phone]  Marie CrockerSherry L Chen  12/04/2018  You are scheduled for a Cardiac Catheterization on Friday, July  10 with Dr. Cristal Deerhristopher End.  1. [facility] at [facility]: [address] [place], [place] [address] at 7:00 AM (This time is two hours before your procedure to ensure your preparation). Free valet parking service is available.   Special note: Every effort is made to have your procedure done on time. Please understand that emergencies sometimes delay scheduled procedures.  2. Diet: Do not eat solid foods after midnight.  The patient may have clear liquids until 5am upon the day of the procedure.  3. Labs: You will need to have blood drawn CBC and BMP drawn 7 days prior   4. Medication instructions in preparation for your procedure: 5. YOU ARE SCHEDULED FOR COVID TESTING ON 12/15/18 at 1015 [facility] call [phone] with questions.    Contrast Allergy: No   Stop taking, LISINOPRIL-HTCZ (Hydrochlorothiazide) Thursday, December 29, 2018  Take only 34 units of insulin the night before your procedure. Do not take any insulin on the day of the procedure.  On the morning of your procedure, take your Aspirin and any morning medicines NOT listed above.  You may use sips of water.  5. Plan for one night stay--bring personal belongings. 6. Bring a current list of your medications and current insurance cards. 7. You MUST have a responsible person to drive you home. 8. Someone MUST be with you the first 24 hours after you arrive home or your discharge will be delayed. 9. Please wear clothes that are easy to get on and off and wear  slip-on shoes.  Thank you for allowing [place] to care for you!   -- [facility]  Follow-Up: At [facility], you and your health needs are our priority.  As part of our continuing mission to provide you with exceptional heart care, we have created designated Provider Care Teams.  These Care Teams include your primary Cardiologist (physician) and Advanced Practice Providers (APPs  -  Physician Assistants and Nurse Practitioners) who all work together to provide you with the care you need, when you need it. You will need a follow up appointment in 1 months.    Any Other Special Instructions Will Be Listed Below  Coronary Angiogram A coronary angiogram is an X-ray procedure that is used to examine the arteries in the heart. In this procedure, a dye (contrast dye) is injected through a long, thin tube (catheter). The catheter is inserted through the groin, wrist, or arm. The dye is injected into each artery, then X-rays are taken to show if there is a blockage in the arteries of the heart. This procedure can also show if you have valve disease or a disease of the aorta, and it can be used to check the overall function of your heart muscle. You may have a coronary angiogram if:  You are having chest pain, or other symptoms of angina, and you are at risk for heart disease.  You have an abnormal electrocardiogram (ECG) or stress test.  You have chest pain and heart failure.  You are having irregular heart rhythms.  You and your health care provider determine that the benefits of the test information outweigh the risks of the procedure. Let your health care provider know about:  Any allergies you have, including allergies to contrast dye.  All medicines you are taking, including vitamins, herbs, eye drops, creams, and over-the-counter medicines.  Any problems you or family members have had with anesthetic medicines.  Any blood disorders you have.  Any surgeries you have had.  History of kidney problems or kidney failure.  Any medical conditions you have.  Whether you are pregnant or may be pregnant. What are the risks? Generally, this is a safe procedure. However, problems may occur, including:  Infection.  Allergic reaction to medicines or dyes that are used.  Bleeding from the access site or other locations.  Kidney injury, especially in people with  impaired kidney function.  Stroke (rare).  Heart attack (rare).  Damage to other structures or organs. What happens before the procedure? Staying hydrated Follow instructions from your health care provider about hydration, which may include:  Up to 2 hours before the procedure - you may continue to drink clear liquids, such as water, clear fruit juice, black coffee, and plain tea. Eating and drinking restrictions Follow instructions from your health care provider about eating and drinking, which may include:  8 hours before the procedure - stop eating heavy meals or foods such as meat, fried foods, or fatty foods.  6 hours before the procedure - stop eating light meals or foods, such as toast or cereal.  2 hours before the procedure - stop drinking clear liquids. General instructions  Ask your health care provider about: ? Changing or stopping your regular medicines. This is especially important if you are taking diabetes medicines or blood thinners. ? Taking medicines such as ibuprofen. These medicines can thin your blood. Do not take these medicines before your procedure if your health care provider instructs you not to, though aspirin may be  recommended prior to coronary angiograms.  Plan to have someone take you home from the hospital or clinic.  You may need to have blood tests or X-rays done. What happens during the procedure?  An IV tube will be inserted into one of your veins.  You will be given one or more of the following: ? A medicine to help you relax (sedative). ? A medicine to numb the area where the catheter will be inserted into an artery (local anesthetic).  To reduce your risk of infection: ? Your health care team will wash or sanitize their hands. ? Your skin will be washed with soap. ? Hair may be removed from the area where the catheter will be inserted.  You will be connected to a continuous ECG monitor.  The catheter will be inserted into an artery.  The location may be in your groin, in your wrist, or in the fold of your arm (near your elbow).  A type of X-ray (fluoroscopy) will be used to help guide the catheter to the opening of the blood vessel that is being examined.  A dye will be injected into the catheter, and X-rays will be taken. The dye will help to show where any narrowing or blockages are located in the heart arteries.  Tell your health care provider if you have any chest pain or trouble breathing during the procedure.  If blockages are found, your health care provider may perform another procedure, such as inserting a coronary stent. The procedure may vary among health care providers and hospitals. What happens after the procedure?  After the procedure, you will need to keep the area still for a few hours, or for as long as told by your health care provider. If the procedure is done through the groin, you will be instructed to not bend and not cross your legs.  The insertion site will be checked frequently.  The pulse in your foot or wrist will be checked frequently.  You may have additional blood tests, X-rays, and a test that records the electrical activity of your heart (ECG).  Do not drive for 24 hours if you were given a sedative. Summary  A coronary angiogram is an X-ray procedure that is used to look into the arteries in the heart.  During the procedure, a dye (contrast dye) is injected through a long, thin tube (catheter). The catheter is inserted through the groin, wrist, or arm.  Tell your health care provider about any allergies you have, including allergies to contrast dye.  After the procedure, you will need to keep the area still for a few hours, or for as long as told by your health care provider. This information is not intended to replace advice given to you by your health care provider. Make sure you discuss any questions you have with your health care provider. Document Released: 12/02/2002 Document  Revised: 03/09/2016 Document Reviewed: 03/09/2016 Elsevier Interactive Patient Education  2019 [employer]

## 2018-12-04 NOTE — H&P (View-Only) (Signed)
Cardiology Office Note:    Date:  12/04/2018   ID:  Marie Chen Aubry, DOB 04/14/1961, MRN [id number]  PCP:  [facility]  Cardiologist:  Garwin Brothersajan R Alonte Wulff, MD   Referring MD: [facility]    ASSESSMENT:    1. Angina pectoris (HCC)   2. Essential hypertension   3. Diabetes mellitus type 2 in obese (HCC)   4. Mixed dyslipidemia   5. Morbid obesity (HCC)    PLAN:    In order of problems listed above:  1. Angina pectoris: Patient has multiple risk factors for coronary artery disease.her symptoms are very concerning.  In view of the patient's symptoms, I discussed with the patient options for evaluation. Invasive and noninvasive options were given to the patient. I discussed stress testing and coronary angiography and left heart catheterization at length. Benefits, pros and cons of each approach were discussed at length. Patient had multiple questions which were answered to the patient's satisfaction. Patient opted for invasive evaluation and we will set up for coronary angiography and left heart catheterization. Further recommendations will be made based on the findings with coronary angiography. In the interim if the patient has any significant symptoms in hospital to the nearest emergency room.  Sublingual nitroglycerin prescription was sent, its protocol and 911 protocol explained and the patient vocalized understanding questions were answered to the patient's satisfaction.  Patient was also advised to take a coated 81 mg of aspirin on a daily basis. 2. Essential hypertension: Blood pressure stable. 3. Diabetes mellitus: Diet was discussed for diabetes mellitus dyslipidemia and obesity.  Risks explained and she vocalized understanding.  She promises to comply. 4. Mixed dyslipidemia: As above 5. Morbid obesity: As above  Medication Adjustments/Labs and Tests Ordered: Current medicines are reviewed at length with the patient today.  Concerns regarding medicines are  outlined above.  No orders of the defined types were placed in this encounter.  No orders of the defined types were placed in this encounter.    History of Present Illness:    Marie Chen Hertenstein is a 58 y.o. female who is being seen today for the evaluation of angina pectoris at the request of [facility].  Patient is a pleasant 58 year old female.  She has past medical history of essential hypertension, dyslipidemia, diabetes mellitus and morbid obesity.  She mentions to me that in the past couple of weeks she is having chest tightness on exertion.  This appears to happen on exertion substernally and radiates to the neck and the jaw and the arms.  This happens consistently on exertion and rest makes it better.  This also comes on with sexual activity to the point that sometimes she has to stop and she feels better.  At the time of my evaluation, the patient is alert awake oriented and in no distress.  Past Medical History:  Diagnosis Date  . Asthma   . Depression   . Diabetes mellitus without complication (HCC)   . GERD (gastroesophageal reflux disease)   . Hypertension   . Migraine   . Sjogren's disease (HCC)   . Sleep apnea    bipap  . Thyroid disease     Past Surgical History:  Procedure Laterality Date  . ANKLE SURGERY    . CARPAL TUNNEL RELEASE    . CHOLECYSTECTOMY    . HYSTEROSCOPY      Current Medications: Current Meds  Medication Sig  . atorvastatin (LIPITOR) 40 MG tablet Take 40 mg by mouth daily.  .Marland Kitchen  cyclobenzaprine (FLEXERIL) 10 MG tablet Take 10 mg by mouth every 8 (eight) hours as needed for muscle spasms.   Marland Kitchen escitalopram (LEXAPRO) 10 MG tablet Take 10 mg by mouth at bedtime.   . fluticasone (FLONASE) 50 MCG/ACT nasal spray Place 1 spray into both nostrils daily as needed (seasonal allergies).   . fluticasone furoate-vilanterol (BREO ELLIPTA) 100-25 MCG/INH AEPB Inhale 1 puff into the lungs daily.  Marland Kitchen glycopyrrolate (ROBINUL) 2 MG tablet TAKE ONE  TABLET BY MOUTH TWICE DAILY  . HYDROcodone-acetaminophen (NORCO) 10-325 MG per tablet Take 1 tablet by mouth 4 (four) times daily as needed (pain).   . insulin aspart (NOVOLOG FLEXPEN) 100 UNIT/ML FlexPen Inject 5-10 Units into the skin See admin instructions. Inject 5-10 units subcutaneously three time daily before meals per sliding scale: CBG up to 175 - 5 units, 175-200 7 units, >200 10 units  . insulin glargine (LANTUS) 100 unit/mL SOPN Inject 69 Units into the skin daily before breakfast.   . ipratropium-albuterol (DUONEB) 0.5-2.5 (3) MG/3ML SOLN Take 3 mLs by nebulization every 6 (six) hours as needed (shortness of breath/wheezing).  Marland Kitchen levothyroxine (SYNTHROID, LEVOTHROID) 112 MCG tablet Take 112 mcg by mouth daily with breakfast.   . lisinopril-hydrochlorothiazide (PRINZIDE,ZESTORETIC) 20-25 MG tablet Take 1 tablet by mouth daily with breakfast.  . montelukast (SINGULAIR) 10 MG tablet Take 10 mg by mouth daily with breakfast.  . potassium chloride SA (K-DUR,KLOR-CON) 20 MEQ tablet Take 2 tablets (40 mEq total) by mouth daily.  . pregabalin (LYRICA) 100 MG capsule Take 100 mg by mouth 3 (three) times daily.  Marland Kitchen terconazole (TERAZOL 7) 0.4 % vaginal cream Place 1 applicator vaginally at bedtime as needed (yeast infections).   . topiramate (TOPAMAX) 25 MG tablet Take 25 mg by mouth at bedtime.     Allergies:   Macrodantin [nitrofurantoin macrocrystal], Metformin and related, Hydromorphone hcl, Meperidine hcl, and Tetracyclines & related   Social History   Socioeconomic History  . Marital status: Widowed    Spouse name: Not on file  . Number of children: 4  . Years of education: Not on file  . Highest education level: Not on file  Occupational History  . Occupation: CSR    Employer: [employer]  Social Needs  . Financial resource strain: Not on file  . Food insecurity    Worry: Not on file    Inability: Not on file  . Transportation needs    Medical: Not on file    Non-medical: Not  on file  Tobacco Use  . Smoking status: Former Smoker    Types: Cigarettes  . Smokeless tobacco: Never Used  Substance and Sexual Activity  . Alcohol use: No    Alcohol/week: 0.0 standard drinks  . Drug use: No  . Sexual activity: Not on file  Lifestyle  . Physical activity    Days per week: Not on file    Minutes per session: Not on file  . Stress: Not on file  Relationships  . Social Herbalist on phone: Not on file    Gets together: Not on file    Attends religious service: Not on file    Active member of club or organization: Not on file    Attends meetings of clubs or organizations: Not on file    Relationship status: Not on file  Other Topics Concern  . Not on file  Social History Narrative  . Not on file     Family History: The patient's family history  includes Heart failure in her father. There is no history of Colon cancer.  ROS:   Please see the history of present illness.    All other systems reviewed and are negative.  EKGs/Labs/Other Studies Reviewed:    The following studies were reviewed today: EKG reveals sinus rhythm and nonspecific ST-T changes   Recent Labs: 05/12/2018: Magnesium 1.6 05/13/2018: ALT 17; BUN 11; Creatinine, Ser 0.65; Hemoglobin 10.7; Platelets 306; Potassium 4.2; Sodium 136  Recent Lipid Panel    Component Value Date/Time   CHOL 220 (H) 03/14/2009 1147   TRIG 128.0 03/14/2009 1147   HDL 38.50 (Chen) 03/14/2009 1147   CHOLHDL 6 03/14/2009 1147   VLDL 25.6 03/14/2009 1147   LDLCALC 96 08/31/2008 1421   LDLDIRECT 179.5 03/14/2009 1147    Physical Exam:    VS:  BP 132/76 (BP Location: Left Arm, Patient Position: Sitting, Cuff Size: Normal)   Pulse 73   Ht 5\' 7"  (1.702 m)   Wt (!) 325 lb (147.4 kg)   SpO2 97%   BMI 50.90 kg/m     Wt Readings from Last 3 Encounters:  12/04/18 (!) 325 lb (147.4 kg)  05/12/18 (!) 312 lb 9.8 oz (141.8 kg)  04/19/14 (!) 322 lb (146.1 kg)     GEN: Patient is in no acute distress  HEENT: Normal NECK: No JVD; No carotid bruits LYMPHATICS: No lymphadenopathy CARDIAC: S1 S2 regular, 2/6 systolic murmur at the apex. RESPIRATORY:  Clear to auscultation without rales, wheezing or rhonchi  ABDOMEN: Soft, non-tender, non-distended MUSCULOSKELETAL:  No edema; No deformity  SKIN: Warm and dry NEUROLOGIC:  Alert and oriented x 3 PSYCHIATRIC:  Normal affect    Signed, Garwin Brothersajan R Navjot Loera, MD  12/04/2018 9:52 AM    [facility]

## 2018-12-15 ENCOUNTER — Other Ambulatory Visit (HOSPITAL_COMMUNITY): Payer: No Typology Code available for payment source

## 2018-12-16 ENCOUNTER — Other Ambulatory Visit (HOSPITAL_COMMUNITY)
Admission: RE | Admit: 2018-12-16 | Discharge: 2018-12-16 | Disposition: A | Discharge status: Home / Self Care | Payer: Medicare HMO | Source: Ambulatory Visit | Attending: Internal Medicine | Admitting: Internal Medicine

## 2018-12-16 DIAGNOSIS — Z1159 Encounter for screening for other viral diseases: Secondary | ICD-10-CM | POA: Diagnosis not present

## 2018-12-16 DIAGNOSIS — Z01812 Encounter for preprocedural laboratory examination: Secondary | ICD-10-CM | POA: Insufficient documentation

## 2018-12-16 LAB — SARS CORONAVIRUS 2 (TAT 6-24 HRS): SARS Coronavirus 2: NEGATIVE

## 2018-12-18 ENCOUNTER — Telehealth: Payer: Self-pay

## 2018-12-18 NOTE — Telephone Encounter (Signed)
Reviewed the following pre-procedure instructions with the patient. Patient verbalized understanding and had no questions at this time.  Pt contacted pre-catheterization scheduled at [facility] for: 9:00 am on 12/19/18  Verified arrival time and place: [place] [facility] at: 7:00 am  Covid-19 test date: 12/16/18  No solid food after midnight prior to cath, clear liquids until 5 AM day of procedure.  Contrast allergy: No  Verified no diabetes medications. Patient is taking diabetic medications  AM meds can be  taken pre-cath with sip of water including: ASA 81 mg  Patient is instructed to hold her potassium medication and also her lisinopril-hydrochlorothiazide the morning of the procedure.  Patient is instructed to hold all diabetic medications the morning of the procedure.  Confirmed patient has responsible person to drive home post procedure and observe 24 hours after arriving home:  The patient has a responsible person to drive them home after the procedure and observe them for 24 hours after arriving home.       COVID-19 Pre-Screening Questions:  . In the past 7 to 10 days have you had a cough,  shortness of breath, headache, congestion, fever (100 or greater) body aches, chills, sore throat, or sudden loss of taste or sense of smell? No . Have you been around anyone with known Covid 19? No . Have you been around anyone who is awaiting Covid 19 test results in the past 7 to 10 days? No . Have you been around anyone who has been exposed to Covid 19, or has mentioned symptoms of Covid 19 within the past 7 to 10 days? No  If you have any concerns/questions about symptoms patients report during screening (either on the phone or at threshold). Contact the provider seeing the patient or DOD for further guidance.  If neither are available contact a member of the leadership team.

## 2018-12-18 NOTE — Telephone Encounter (Signed)
Attempted to call patient to review the following pre-procedure instructions with her but she did not answer the phone. Left voicemail for her to call back. Will try to call again this afternoon.  Pt contacted pre-catheterization scheduled at [facility] for: 9:00 am on 12/19/18  Verified arrival time and place: [place] [facility] at: 7:00 am  Covid-19 test date: 12/16/18  No solid food after midnight prior to cath, clear liquids until 5 AM day of procedure.  Contrast allergy: No  Verified no diabetes medications. Patient is taking diabetic medications  AM meds can be  taken pre-cath with sip of water including: ASA 81 mg  Patient is instructed to hold her potassium medication and also her lisinopril-hydrochlorothiazide the morning of the procedure.  Patient is instructed to hold all diabetic medications the morning of the procedure.  Patient is instructed to take half of her usual Lantus insulin dose the night before the procedure.  Confirmed patient has responsible person to drive home post procedure and observe 24 hours after arriving home:  The patient has a responsible person to drive them home after the procedure and observe them for 24 hours after arriving home.

## 2018-12-19 ENCOUNTER — Other Ambulatory Visit: Payer: Self-pay

## 2018-12-19 ENCOUNTER — Encounter (HOSPITAL_COMMUNITY): Payer: Self-pay | Admitting: Internal Medicine

## 2018-12-19 ENCOUNTER — Encounter (HOSPITAL_COMMUNITY)
Admission: RE | Disposition: A | Discharge status: Home / Self Care | Payer: Self-pay | Source: Home / Self Care | Attending: Internal Medicine

## 2018-12-19 ENCOUNTER — Ambulatory Visit (HOSPITAL_COMMUNITY)
Admission: RE | Admit: 2018-12-19 | Discharge: 2018-12-19 | Disposition: A | Discharge status: Home / Self Care | Payer: Medicare HMO | Attending: Internal Medicine | Admitting: Internal Medicine

## 2018-12-19 DIAGNOSIS — Z87891 Personal history of nicotine dependence: Secondary | ICD-10-CM | POA: Diagnosis not present

## 2018-12-19 DIAGNOSIS — E782 Mixed hyperlipidemia: Secondary | ICD-10-CM

## 2018-12-19 DIAGNOSIS — F329 Major depressive disorder, single episode, unspecified: Secondary | ICD-10-CM | POA: Diagnosis not present

## 2018-12-19 DIAGNOSIS — J45909 Unspecified asthma, uncomplicated: Secondary | ICD-10-CM | POA: Insufficient documentation

## 2018-12-19 DIAGNOSIS — Z7951 Long term (current) use of inhaled steroids: Secondary | ICD-10-CM | POA: Diagnosis not present

## 2018-12-19 DIAGNOSIS — I25110 Atherosclerotic heart disease of native coronary artery with unstable angina pectoris: Secondary | ICD-10-CM | POA: Insufficient documentation

## 2018-12-19 DIAGNOSIS — Z6841 Body Mass Index (BMI) 40.0 and over, adult: Secondary | ICD-10-CM | POA: Diagnosis not present

## 2018-12-19 DIAGNOSIS — I10 Essential (primary) hypertension: Secondary | ICD-10-CM | POA: Diagnosis not present

## 2018-12-19 DIAGNOSIS — K219 Gastro-esophageal reflux disease without esophagitis: Secondary | ICD-10-CM | POA: Insufficient documentation

## 2018-12-19 DIAGNOSIS — G4730 Sleep apnea, unspecified: Secondary | ICD-10-CM | POA: Insufficient documentation

## 2018-12-19 DIAGNOSIS — Z79899 Other long term (current) drug therapy: Secondary | ICD-10-CM | POA: Insufficient documentation

## 2018-12-19 DIAGNOSIS — E669 Obesity, unspecified: Secondary | ICD-10-CM

## 2018-12-19 DIAGNOSIS — E119 Type 2 diabetes mellitus without complications: Secondary | ICD-10-CM | POA: Diagnosis not present

## 2018-12-19 DIAGNOSIS — E079 Disorder of thyroid, unspecified: Secondary | ICD-10-CM | POA: Diagnosis not present

## 2018-12-19 DIAGNOSIS — E1169 Type 2 diabetes mellitus with other specified complication: Secondary | ICD-10-CM

## 2018-12-19 DIAGNOSIS — Z794 Long term (current) use of insulin: Secondary | ICD-10-CM | POA: Insufficient documentation

## 2018-12-19 DIAGNOSIS — I209 Angina pectoris, unspecified: Secondary | ICD-10-CM | POA: Diagnosis present

## 2018-12-19 DIAGNOSIS — M3500 Sicca syndrome, unspecified: Secondary | ICD-10-CM | POA: Diagnosis not present

## 2018-12-19 HISTORY — PX: LEFT HEART CATH AND CORONARY ANGIOGRAPHY: CATH118249

## 2018-12-19 LAB — CBC
HCT: 38.4 % (ref 36.0–46.0)
Hemoglobin: 11.5 g/dL — ABNORMAL LOW (ref 12.0–15.0)
MCH: 24.7 pg — ABNORMAL LOW (ref 26.0–34.0)
MCHC: 29.9 g/dL — ABNORMAL LOW (ref 30.0–36.0)
MCV: 82.6 fL (ref 80.0–100.0)
Platelets: 327 10*3/uL (ref 150–400)
RBC: 4.65 MIL/uL (ref 3.87–5.11)
RDW: 15.7 % — ABNORMAL HIGH (ref 11.5–15.5)
WBC: 9.6 10*3/uL (ref 4.0–10.5)
nRBC: 0 % (ref 0.0–0.2)

## 2018-12-19 LAB — BASIC METABOLIC PANEL
Anion gap: 9 (ref 5–15)
BUN: 10 mg/dL (ref 6–20)
CO2: 25 mmol/L (ref 22–32)
Calcium: 9.2 mg/dL (ref 8.9–10.3)
Chloride: 101 mmol/L (ref 98–111)
Creatinine, Ser: 0.68 mg/dL (ref 0.44–1.00)
GFR calc Af Amer: >60 mL/min (ref >60)
GFR calc non Af Amer: >60 mL/min (ref >60)
Glucose, Bld: 188 mg/dL — ABNORMAL HIGH (ref 70–99)
Potassium: 4.1 mmol/L (ref 3.5–5.1)
Sodium: 135 mmol/L (ref 135–145)

## 2018-12-19 LAB — GLUCOSE, CAPILLARY: Glucose-Capillary: 189 mg/dL — ABNORMAL HIGH (ref 70–99)

## 2018-12-19 SURGERY — LEFT HEART CATH AND CORONARY ANGIOGRAPHY
Anesthesia: LOCAL

## 2018-12-19 MED ORDER — HEPARIN (PORCINE) IN NACL 1000-0.9 UT/500ML-% IV SOLN
INTRAVENOUS | Status: DC | PRN
  Administered 2018-12-19 (×2): 500 mL

## 2018-12-19 MED ORDER — VERAPAMIL HCL 2.5 MG/ML IV SOLN
INTRAVENOUS | Status: AC
  Filled 2018-12-19: qty 2

## 2018-12-19 MED ORDER — LIDOCAINE HCL (PF) 1 % IJ SOLN
INTRAMUSCULAR | Status: AC
  Filled 2018-12-19: qty 30

## 2018-12-19 MED ORDER — SODIUM CHLORIDE 0.9 % WEIGHT BASED INFUSION: 1.0000 mL/kg/h | INTRAVENOUS | Status: DC

## 2018-12-19 MED ORDER — SODIUM CHLORIDE 0.9% FLUSH: 3.0000 mL | INTRAVENOUS | Status: DC | PRN

## 2018-12-19 MED ORDER — SODIUM CHLORIDE 0.9 % IV SOLN: INTRAVENOUS | Status: AC

## 2018-12-19 MED ORDER — ACETAMINOPHEN 325 MG PO TABS: 650.0000 mg | ORAL_TABLET | ORAL | Status: DC | PRN

## 2018-12-19 MED ORDER — HEPARIN (PORCINE) IN NACL 1000-0.9 UT/500ML-% IV SOLN
INTRAVENOUS | Status: AC
  Filled 2018-12-19: qty 1000

## 2018-12-19 MED ORDER — INSULIN ASPART 100 UNIT/ML ~~LOC~~ SOLN: 0.0000 [IU] | Freq: Every day | SUBCUTANEOUS | Status: DC

## 2018-12-19 MED ORDER — FENTANYL CITRATE (PF) 100 MCG/2ML IJ SOLN
INTRAMUSCULAR | Status: AC
  Filled 2018-12-19: qty 2

## 2018-12-19 MED ORDER — ONDANSETRON HCL 4 MG/2ML IJ SOLN: 4.0000 mg | Freq: Four times a day (QID) | INTRAMUSCULAR | Status: DC | PRN

## 2018-12-19 MED ORDER — SODIUM CHLORIDE 0.9% FLUSH: 3.0000 mL | Freq: Two times a day (BID) | INTRAVENOUS | Status: DC

## 2018-12-19 MED ORDER — MIDAZOLAM HCL 2 MG/2ML IJ SOLN
INTRAMUSCULAR | Status: DC | PRN
  Administered 2018-12-19: 1 mg via INTRAVENOUS

## 2018-12-19 MED ORDER — SODIUM CHLORIDE 0.9 % IV SOLN: 250.0000 mL | INTRAVENOUS | Status: DC | PRN

## 2018-12-19 MED ORDER — INSULIN ASPART 100 UNIT/ML ~~LOC~~ SOLN: 0.0000 [IU] | Freq: Three times a day (TID) | SUBCUTANEOUS | Status: DC

## 2018-12-19 MED ORDER — HEPARIN SODIUM (PORCINE) 1000 UNIT/ML IJ SOLN
INTRAMUSCULAR | Status: DC | PRN
  Administered 2018-12-19: 5000 [IU] via INTRAVENOUS

## 2018-12-19 MED ORDER — IOHEXOL 350 MG/ML SOLN
INTRAVENOUS | Status: DC | PRN
  Administered 2018-12-19: 105 mL via INTRAVENOUS

## 2018-12-19 MED ORDER — FENTANYL CITRATE (PF) 100 MCG/2ML IJ SOLN
INTRAMUSCULAR | Status: DC | PRN
  Administered 2018-12-19 (×2): 25 ug via INTRAVENOUS

## 2018-12-19 MED ORDER — HYDRALAZINE HCL 20 MG/ML IJ SOLN: 10.0000 mg | INTRAMUSCULAR | Status: DC | PRN

## 2018-12-19 MED ORDER — MIDAZOLAM HCL 2 MG/2ML IJ SOLN
INTRAMUSCULAR | Status: AC
  Filled 2018-12-19: qty 2

## 2018-12-19 MED ORDER — SODIUM CHLORIDE 0.9 % WEIGHT BASED INFUSION
3.0000 mL/kg/h | INTRAVENOUS | Status: AC
  Administered 2018-12-19: 3 mL/kg/h via INTRAVENOUS

## 2018-12-19 MED ORDER — LIDOCAINE HCL (PF) 1 % IJ SOLN
INTRAMUSCULAR | Status: DC | PRN
  Administered 2018-12-19: 2 mL

## 2018-12-19 MED ORDER — HEPARIN SODIUM (PORCINE) 1000 UNIT/ML IJ SOLN
INTRAMUSCULAR | Status: AC
  Filled 2018-12-19: qty 1

## 2018-12-19 MED ORDER — ASPIRIN 81 MG PO CHEW: 81.0000 mg | CHEWABLE_TABLET | ORAL | Status: DC

## 2018-12-19 MED ORDER — VERAPAMIL HCL 2.5 MG/ML IV SOLN
INTRAVENOUS | Status: DC | PRN
  Administered 2018-12-19: 10 mL via INTRA_ARTERIAL

## 2018-12-19 MED ORDER — LABETALOL HCL 5 MG/ML IV SOLN: 10.0000 mg | INTRAVENOUS | Status: DC | PRN

## 2018-12-19 SURGICAL SUPPLY — 14 items
CATH INFINITI 5FR ANG PIGTAIL (CATHETERS) ×1 IMPLANT
CATH INFINITI JR4 5F (CATHETERS) ×1 IMPLANT
CATH OPTITORQUE TIG 4.0 5F (CATHETERS) ×1 IMPLANT
DEVICE RAD COMP TR BAND LRG (VASCULAR PRODUCTS) ×1 IMPLANT
ELECT DEFIB PAD ADLT CADENCE (PAD) ×1 IMPLANT
GLIDESHEATH SLEND SS 6F .021 (SHEATH) ×1 IMPLANT
GUIDEWIRE INQWIRE 1.5J.035X260 (WIRE) IMPLANT
INQWIRE 1.5J .035X260CM (WIRE) ×2
KIT HEART LEFT (KITS) ×2 IMPLANT
PACK CARDIAC CATHETERIZATION (CUSTOM PROCEDURE TRAY) ×2 IMPLANT
SYR MEDRAD MARK 7 150ML (SYRINGE) ×2 IMPLANT
TRANSDUCER W/STOPCOCK (MISCELLANEOUS) ×2 IMPLANT
TUBING CIL FLEX 10 FLL-RA (TUBING) ×2 IMPLANT
WIRE HI TORQ VERSACORE-J 145CM (WIRE) ×1 IMPLANT

## 2018-12-19 NOTE — Discharge Instructions (Signed)
KEEP RIGHT ARM ELEVATED ABOVE LEVEL OF HEART  °Radial Site Care ° ° °This sheet gives you information about how to care for yourself after your procedure. Your health care provider may also give you more specific instructions. If you have problems or questions, contact your health care provider. °What can I expect after the procedure? °After the procedure, it is common to have: °· Bruising and tenderness at the catheter insertion area. °Follow these instructions at home: °Medicines °· Take over-the-counter and prescription medicines only as told by your health care provider. °Insertion site care °· Follow instructions from your health care provider about how to take care of your insertion site. Make sure you: °? Wash your hands with soap and water before you change your bandage (dressing). If soap and water are not available, use hand sanitizer. °? Change your dressing as told by your health care provider. °? Leave stitches (sutures), skin glue, or adhesive strips in place. These skin closures may need to stay in place for 2 weeks or longer. If adhesive strip edges start to loosen and curl up, you may trim the loose edges. Do not remove adhesive strips completely unless your health care provider tells you to do that. °· Check your insertion site every day for signs of infection. Check for: °? Redness, swelling, or pain. °? Fluid or blood. °? Pus or a bad smell. °? Warmth. °· Do not take baths, swim, or use a hot tub until your health care provider approves. °· You may shower 24-48 hours after the procedure, or as directed by your health care provider. °? Remove the dressing and gently wash the site with plain soap and water. °? Pat the area dry with a clean towel. °? Do not rub the site. That could cause bleeding. °· Do not apply powder or lotion to the site. °Activity ° °· For 24 hours after the procedure, or as directed by your health care provider: °? Do not flex or bend the affected arm. °? Do not push or pull  heavy objects with the affected arm. °? Do not drive yourself home from the hospital or clinic. You may drive 24 hours after the procedure unless your health care provider tells you not to. °? Do not operate machinery or power tools. °· Do not lift anything that is heavier than 10 lb (4.5 kg), or the limit that you are told, until your health care provider says that it is safe. °· Ask your health care provider when it is okay to: °? Return to work or school. °? Resume usual physical activities or sports. °? Resume sexual activity. °General instructions °· If the catheter site starts to bleed, raise your arm and put firm pressure on the site. If the bleeding does not stop, get help right away. This is a medical emergency. °· If you went home on the same day as your procedure, a responsible adult should be with you for the first 24 hours after you arrive home. °· Keep all follow-up visits as told by your health care provider. This is important. °Contact a health care provider if: °· You have a fever. °· You have redness, swelling, or yellow drainage around your insertion site. °Get help right away if: °· You have unusual pain at the radial site. °· The catheter insertion area swells very fast. °· The insertion area is bleeding, and the bleeding does not stop when you hold steady pressure on the area. °· Your arm or hand becomes pale, cool,   tingly, or numb. °These symptoms may represent a serious problem that is an emergency. Do not wait to see if the symptoms will go away. Get medical help right away. Call your local emergency services (911 in the [place] Do not drive yourself to the hospital. °Summary °· After the procedure, it is common to have bruising and tenderness at the site. °· Follow instructions from your health care provider about how to take care of your radial site wound. Check the wound every day for signs of infection. °· Do not lift anything that is heavier than 10 lb (4.5 kg), or the limit that you are  told, until your health care provider says that it is safe. °This information is not intended to replace advice given to you by your health care provider. Make sure you discuss any questions you have with your health care provider. °Document Released: 06/30/2010 Document Revised: 07/03/2017 Document Reviewed: 07/03/2017 °Elsevier Patient Education © 2020 Elsevier Inc. ° °

## 2018-12-19 NOTE — Interval H&P Note (Signed)
History and Physical Interval Note:  12/19/2018 8:22 AM  Marie Chen  has presented today for cardiac catheterization, with the diagnosis of angina.  The various methods of treatment have been discussed with the patient and family. After consideration of risks, benefits and other options for treatment, the patient has consented to  Procedure(s): LEFT HEART CATH AND CORONARY ANGIOGRAPHY (N/A) as a surgical intervention.  The patient's history has been reviewed, patient examined, no change in status, stable for surgery.  I have reviewed the patient's chart and labs.  Questions were answered to the patient's satisfaction.    Cath Lab Visit (complete for each Cath Lab visit)  Clinical Evaluation Leading to the Procedure:   ACS: No.  Non-ACS:    Anginal Classification: CCS IV  Anti-ischemic medical therapy: No Therapy  Non-Invasive Test Results: No non-invasive testing performed  Prior CABG: No previous CABG  Dwon Sky

## 2019-01-01 ENCOUNTER — Encounter: Payer: Self-pay | Admitting: Cardiology

## 2019-01-01 ENCOUNTER — Ambulatory Visit (INDEPENDENT_AMBULATORY_CARE_PROVIDER_SITE_OTHER): Payer: Medicare HMO | Admitting: Cardiology

## 2019-01-01 ENCOUNTER — Other Ambulatory Visit: Payer: Self-pay

## 2019-01-01 VITALS — BP 130/72 | HR 103 | Ht 67.5 in | Wt 328.0 lb

## 2019-01-01 DIAGNOSIS — I10 Essential (primary) hypertension: Secondary | ICD-10-CM | POA: Diagnosis not present

## 2019-01-01 DIAGNOSIS — E669 Obesity, unspecified: Secondary | ICD-10-CM

## 2019-01-01 DIAGNOSIS — I2510 Atherosclerotic heart disease of native coronary artery without angina pectoris: Secondary | ICD-10-CM | POA: Insufficient documentation

## 2019-01-01 DIAGNOSIS — E1169 Type 2 diabetes mellitus with other specified complication: Secondary | ICD-10-CM | POA: Diagnosis not present

## 2019-01-01 HISTORY — DX: Atherosclerotic heart disease of native coronary artery without angina pectoris: I25.10

## 2019-01-01 NOTE — Patient Instructions (Addendum)
Medication Instructions:  Your physician recommends that you continue on your current medications as directed. Please refer to the Current Medication list given to you today.  If you need a refill on your cardiac medications before your next appointment, please call your pharmacy.   Lab work: You will have labs drawn at [facility]  If you have labs (blood work) drawn today and your tests are completely normal, you will receive your results only by: Marland Kitchen MyChart Message (if you have MyChart) OR . A paper copy in the mail If you have any lab test that is abnormal or we need to change your treatment, we will call you to review the results.  Testing/Procedures: NONE  Follow-Up: At [facility], you and your health needs are our priority.  As part of our continuing mission to provide you with exceptional heart care, we have created designated Provider Care Teams.  These Care Teams include your primary Cardiologist (physician) and Advanced Practice Providers (APPs -  Physician Assistants and Nurse Practitioners) who all work together to provide you with the care you need, when you need it. You will need a follow up appointment in 4 months.     Heart-Healthy Eating Plan Heart-healthy meal planning includes:  Eating less unhealthy fats.  Eating more healthy fats.  Making other changes in your diet. Talk with your doctor or a diet specialist (dietitian) to create an eating plan that is right for you. What is my plan? What are tips for following this plan? Cooking Avoid frying your food. Try to bake, boil, grill, or broil it instead. You can also reduce fat by:  Removing the skin from poultry.  Removing all visible fats from meats.  Steaming vegetables in water or broth. Meal planning   At meals, divide your plate into four equal parts: ? Fill one-half of your plate with vegetables and green salads. ? Fill one-fourth of your plate with whole grains. ? Fill one-fourth of your plate with  lean protein foods.  Eat 4-5 servings of vegetables per day. A serving of vegetables is: ? 1 cup of raw or cooked vegetables. ? 2 cups of raw leafy greens.  Eat 4-5 servings of fruit per day. A serving of fruit is: ? 1 medium whole fruit. ?  cup of dried fruit. ?  cup of fresh, frozen, or canned fruit. ?  cup of 100% fruit juice.  Eat more foods that have soluble fiber. These are apples, broccoli, carrots, beans, peas, and barley. Try to get 20-30 g of fiber per day.  Eat 4-5 servings of nuts, legumes, and seeds per week: ? 1 serving of dried beans or legumes equals  cup after being cooked. ? 1 serving of nuts is  cup. ? 1 serving of seeds equals 1 tablespoon. General information  Eat more home-cooked food. Eat less restaurant, buffet, and fast food.  Limit or avoid alcohol.  Limit foods that are high in starch and sugar.  Avoid fried foods.  Lose weight if you are overweight.  Keep track of how much salt (sodium) you eat. This is important if you have high blood pressure. Ask your doctor to tell you more about this.  Try to add vegetarian meals each week. Fats  Choose healthy fats. These include olive oil and canola oil, flaxseeds, walnuts, almonds, and seeds.  Eat more omega-3 fats. These include salmon, mackerel, sardines, tuna, flaxseed oil, and ground flaxseeds. Try to eat fish at least 2 times each week.  Check food labels.  Avoid foods with trans fats or high amounts of saturated fat.  Limit saturated fats. ? These are often found in animal products, such as meats, butter, and cream. ? These are also found in plant foods, such as palm oil, palm kernel oil, and coconut oil.  Avoid foods with partially hydrogenated oils in them. These have trans fats. Examples are stick margarine, some tub margarines, cookies, crackers, and other baked goods. What foods can I eat? Fruits All fresh, canned (in natural juice), or frozen fruits. Vegetables Fresh or frozen  vegetables (raw, steamed, roasted, or grilled). Green salads. Grains Most grains. Choose whole wheat and whole grains most of the time. Rice and pasta, including brown rice and pastas made with whole wheat. Meats and other proteins Lean, well-trimmed beef, veal, pork, and lamb. Chicken and [place] without skin. All fish and shellfish. Wild duck, rabbit, pheasant, and venison. Egg whites or low-cholesterol egg substitutes. Dried beans, peas, lentils, and tofu. Seeds and most nuts. Dairy Low-fat or nonfat cheeses, including ricotta and mozzarella. Skim or 1% milk that is liquid, powdered, or evaporated. Buttermilk that is made with low-fat milk. Nonfat or low-fat yogurt. Fats and oils Non-hydrogenated (trans-free) margarines. Vegetable oils, including soybean, sesame, sunflower, olive, peanut, safflower, corn, canola, and cottonseed. Salad dressings or mayonnaise made with a vegetable oil. Beverages Mineral water. Coffee and tea. Diet carbonated beverages. Sweets and desserts Sherbet, gelatin, and fruit ice. Small amounts of dark chocolate. Limit all sweets and desserts. Seasonings and condiments All seasonings and condiments. The items listed above may not be a complete list of foods and drinks you can eat. Contact a dietitian for more options. What foods should I avoid? Fruits Canned fruit in heavy syrup. Fruit in cream or butter sauce. Fried fruit. Limit coconut. Vegetables Vegetables cooked in cheese, cream, or butter sauce. Fried vegetables. Grains Breads that are made with saturated or trans fats, oils, or whole milk. Croissants. Sweet rolls. Donuts. High-fat crackers, such as cheese crackers. Meats and other proteins Fatty meats, such as hot dogs, ribs, sausage, bacon, rib-eye roast or steak. High-fat deli meats, such as salami and bologna. Caviar. Domestic duck and goose. Organ meats, such as liver. Dairy Cream, sour cream, cream cheese, and creamed cottage cheese. Whole-milk cheeses.  Whole or 2% milk that is liquid, evaporated, or condensed. Whole buttermilk. Cream sauce or high-fat cheese sauce. Yogurt that is made from whole milk. Fats and oils Meat fat, or shortening. Cocoa butter, hydrogenated oils, palm oil, coconut oil, palm kernel oil. Solid fats and shortenings, including bacon fat, salt pork, lard, and butter. Nondairy cream substitutes. Salad dressings with cheese or sour cream. Beverages Regular sodas and juice drinks with added sugar. Sweets and desserts Frosting. Pudding. Cookies. Cakes. Pies. Milk chocolate or white chocolate. Buttered syrups. Full-fat ice cream or ice cream drinks. The items listed above may not be a complete list of foods and drinks to avoid. Contact a dietitian for more information. Summary  Heart-healthy meal planning includes eating less unhealthy fats, eating more healthy fats, and making other changes in your diet.  Eat a balanced diet. This includes fruits and vegetables, low-fat or nonfat dairy, lean protein, nuts and legumes, whole grains, and heart-healthy oils and fats. This information is not intended to replace advice given to you by your health care provider. Make sure you discuss any questions you have with your health care provider. Document Released: 11/27/2011 Document Revised: 08/01/2017 Document Reviewed: 07/05/2017 Elsevier Patient Education  2020 [employer]

## 2019-01-01 NOTE — Progress Notes (Signed)
Cardiology Office Note:    Date:  01/01/2019   ID:  Marie Chen, DOB 01/13/1961, MRN [id number]  PCP:  [facility]  Cardiologist:  Garwin Brothersajan R Darby Shadwick, MD   Referring MD: [facility]    ASSESSMENT:    1. Essential hypertension   2. Diabetes mellitus type 2 in obese (HCC)   3. Morbid obesity (HCC)    PLAN:    In order of problems listed above:  1. Coronary artery disease: Nonobstructive in nature.  Secondary prevention stressed with the patient.  Importance of compliance with diet and medication stressed and she vocalized understanding.  Weight reduction was stressed and risks of obesity explained. 2. Essential hypertension: Her blood pressure stable. 3. Mixed dyslipidemia and diabetes mellitus.:  She needs to be on statin therapy.  She cannot tolerate Lipitor.  She is going to a primary care physician and will give her a prescription for a Chem-7 liver lipid and to be faxed to us.  We will start her on Crestor 10 mg daily and see how she will do with this medication once we get the results of the blood test. 4. Morbid obesity: Diet was advised for weight loss and she is agreeable. 5. Patient will be seen in follow-up appointment in 4 months or earlier if the patient has any concerns    Medication Adjustments/Labs and Tests Ordered: Current medicines are reviewed at length with the patient today.  Concerns regarding medicines are outlined above.  No orders of the defined types were placed in this encounter.  No orders of the defined types were placed in this encounter.    No chief complaint on file.    History of Present Illness:    Marie Chen is a 58 y.o. female.  Patient has past medical history of essential hypertension, dyslipidemia, diabetes mellitus, morbid obesity.  She underwent coronary angiography and the details are mentioned below.  She denies any chest pain orthopnea or PND.  She is happy about the results.  At the time of my  evaluation, the patient is alert awake oriented and in no distress.  Past Medical History:  Diagnosis Date  . Asthma   . Depression   . Diabetes mellitus without complication (HCC)   . GERD (gastroesophageal reflux disease)   . Hypertension   . Migraine   . Sjogren's disease (HCC)   . Sleep apnea    bipap  . Thyroid disease     Past Surgical History:  Procedure Laterality Date  . ANKLE SURGERY    . CARPAL TUNNEL RELEASE    . CHOLECYSTECTOMY    . HYSTEROSCOPY    . LEFT HEART CATH AND CORONARY ANGIOGRAPHY N/A 12/19/2018   Procedure: LEFT HEART CATH AND CORONARY ANGIOGRAPHY;  Surgeon: Yvonne KendallEnd, Christopher, MD;  Location: [facility];  Service: Cardiovascular;  Laterality: N/A;    Current Medications: Current Meds  Medication Sig  . aspirin EC 81 MG tablet Take 1 tablet (81 mg total) by mouth daily.  . calcium carbonate (TUMS - DOSED IN MG ELEMENTAL CALCIUM) 500 MG chewable tablet Chew 2 tablets by mouth daily as needed for indigestion or heartburn.  . Carboxymethylcellul-Glycerin (LUBRICATING EYE DROPS OP) Place 1 drop into both eyes daily as needed (dry eyes).  . Coenzyme Q10 200 MG capsule Take 400 mg by mouth daily.  . cyclobenzaprine (FLEXERIL) 10 MG tablet Take 10 mg by mouth every 8 (eight) hours as needed for muscle spasms.   Marland Kitchen. escitalopram (LEXAPRO) 10  MG tablet Take 10 mg by mouth at bedtime.   . fluticasone (FLONASE) 50 MCG/ACT nasal spray Place 1 spray into both nostrils daily as needed (seasonal allergies).   . fluticasone furoate-vilanterol (BREO ELLIPTA) 100-25 MCG/INH AEPB Inhale 1 puff into the lungs daily.  Marland Kitchen glycopyrrolate (ROBINUL) 2 MG tablet TAKE ONE TABLET BY MOUTH TWICE DAILY  . HYDROcodone-acetaminophen (NORCO) 10-325 MG per tablet Take 1 tablet by mouth 3 (three) times daily as needed (pain).   . insulin aspart (NOVOLOG FLEXPEN) 100 UNIT/ML FlexPen Inject 2-12 Units into the skin 3 (three) times daily with meals.   . insulin glargine (LANTUS) 100  unit/mL SOPN Inject 69 Units into the skin daily before breakfast.   . ipratropium-albuterol (DUONEB) 0.5-2.5 (3) MG/3ML SOLN Take 3 mLs by nebulization every 6 (six) hours as needed (shortness of breath/wheezing).  Marland Kitchen levothyroxine (SYNTHROID, LEVOTHROID) 112 MCG tablet Take 112 mcg by mouth daily before breakfast.   . lisinopril-hydrochlorothiazide (PRINZIDE,ZESTORETIC) 20-25 MG tablet Take 1 tablet by mouth daily with breakfast.  . Magnesium 400 MG TABS Take 400 mg by mouth every other day.  . montelukast (SINGULAIR) 10 MG tablet Take 10 mg by mouth daily with breakfast.  . naproxen sodium (ALEVE) 220 MG tablet Take 440 mg by mouth daily as needed (pain).  . nitroGLYCERIN (NITROSTAT) 0.4 MG SL tablet Place 1 tablet (0.4 mg total) under the tongue every 5 (five) minutes as needed. (Patient taking differently: Place 0.4 mg under the tongue every 5 (five) minutes as needed for chest pain. )  . OVER THE COUNTER MEDICATION Take 300 mg by mouth at bedtime. CBD oil  . pregabalin (LYRICA) 100 MG capsule Take 100 mg by mouth 4 (four) times daily.   Marland Kitchen terconazole (TERAZOL 7) 0.4 % vaginal cream Place 1 applicator vaginally at bedtime as needed (yeast infections).   . topiramate (TOPAMAX) 25 MG tablet Take 25 mg by mouth at bedtime.  . Vitamin D, Ergocalciferol, (DRISDOL) 1.25 MG (50000 UT) CAPS capsule Take 50,000 Units by mouth every Saturday.     Allergies:   Macrodantin [nitrofurantoin macrocrystal], Metformin and related, Hydromorphone hcl, Lipitor [atorvastatin calcium], Meperidine hcl, and Tetracyclines & related   Social History   Socioeconomic History  . Marital status: Widowed    Spouse name: Not on file  . Number of children: 4  . Years of education: Not on file  . Highest education level: Not on file  Occupational History  . Occupation: CSR    Employer: [employer]  Social Needs  . Financial resource strain: Not on file  . Food insecurity    Worry: Not on file    Inability: Not on  file  . Transportation needs    Medical: Not on file    Non-medical: Not on file  Tobacco Use  . Smoking status: Former Smoker    Types: Cigarettes  . Smokeless tobacco: Never Used  Substance and Sexual Activity  . Alcohol use: No    Alcohol/week: 0.0 standard drinks  . Drug use: No  . Sexual activity: Not on file  Lifestyle  . Physical activity    Days per week: Not on file    Minutes per session: Not on file  . Stress: Not on file  Relationships  . Social Herbalist on phone: Not on file    Gets together: Not on file    Attends religious service: Not on file    Active member of club or organization: Not on file  Attends meetings of clubs or organizations: Not on file    Relationship status: Not on file  Other Topics Concern  . Not on file  Social History Narrative  . Not on file     Family History: The patient's family history includes Heart failure in her father. There is no history of Colon cancer.  ROS:   Please see the history of present illness.    All other systems reviewed and are negative.  EKGs/Labs/Other Studies Reviewed:    The following studies were reviewed today: LEFT HEART CATH AND CORONARY ANGIOGRAPHY  Conclusion  Conclusions: 6. Mild to moderate, non-obstructive coronary artery disease, including 30% mid LAD stenosis, 30-40% ostial D1 lesion, 20% ostial LCx stenosis, and 30% ostial and 20% mid RCA stenosis. 7. Mildly elevated left-ventricular filling pressure with normal left ventricular contraction. 8. Catheter-induced LBBB during left heart catheterization.  Recommendations: 1. Medical therapy and risk factor modification to prevent progression of coronary artery disease. 2. Post-cath telemetry monitoring, given catheter-induced LBBB at the end of the procedure. 3. Consider outpatient evaluation of other alternative causes of chest pain.  Yvonne Kendallhristopher End, MD [facility] Pager: [phone]       Recent Labs:  05/12/2018: Magnesium 1.6 05/13/2018: ALT 17 12/19/2018: BUN 10; Creatinine, Ser 0.68; Hemoglobin 11.5; Platelets 327; Potassium 4.1; Sodium 135  Recent Lipid Panel    Component Value Date/Time   CHOL 220 (H) 03/14/2009 1147   TRIG 128.0 03/14/2009 1147   HDL 38.50 (L) 03/14/2009 1147   CHOLHDL 6 03/14/2009 1147   VLDL 25.6 03/14/2009 1147   LDLCALC 96 08/31/2008 1421   LDLDIRECT 179.5 03/14/2009 1147    Physical Exam:    VS:  BP 130/72 (BP Location: Left Arm, Patient Position: Sitting, Cuff Size: Normal)   Pulse (!) 103   Ht 5' 7.5" (1.715 m)   Wt (!) 328 lb (148.8 kg)   SpO2 97%   BMI 50.61 kg/m     Wt Readings from Last 3 Encounters:  01/01/19 (!) 328 lb (148.8 kg)  12/19/18 (!) 310 lb (140.6 kg)  12/04/18 (!) 325 lb (147.4 kg)     GEN: Patient is in no acute distress HEENT: Normal NECK: No JVD; No carotid bruits LYMPHATICS: No lymphadenopathy CARDIAC: Hear sounds regular, 2/6 systolic murmur at the apex. RESPIRATORY:  Clear to auscultation without rales, wheezing or rhonchi  ABDOMEN: Soft, non-tender, non-distended MUSCULOSKELETAL:  No edema; No deformity  SKIN: Warm and dry NEUROLOGIC:  Alert and oriented x 3 PSYCHIATRIC:  Normal affect   Signed, Garwin Brothersajan R Lanorris Kalisz, MD  01/01/2019 9:43 AM    [facility]

## 2019-01-07 ENCOUNTER — Other Ambulatory Visit: Payer: Self-pay | Admitting: Cardiology

## 2019-01-08 LAB — LIPID PANEL W/O CHOL/HDL RATIO
Cholesterol, Total: 179 mg/dL (ref 100–199)
HDL: 34 mg/dL — ABNORMAL LOW (ref >39)
LDL Calculated: 123 mg/dL — ABNORMAL HIGH (ref 0–99)
Triglycerides: 112 mg/dL (ref 0–149)
VLDL Cholesterol Cal: 22 mg/dL (ref 5–40)

## 2019-01-08 LAB — HEPATIC FUNCTION PANEL
ALT: 14 IU/L (ref 0–32)
AST: 18 IU/L (ref 0–40)
Albumin: 3.5 g/dL — ABNORMAL LOW (ref 3.8–4.9)
Alkaline Phosphatase: 119 IU/L — ABNORMAL HIGH (ref 39–117)
Bilirubin Total: 0.4 mg/dL (ref 0.0–1.2)
Bilirubin, Direct: <0.1 mg/dL (ref 0.00–0.40)
Total Protein: 5.9 g/dL — ABNORMAL LOW (ref 6.0–8.5)

## 2019-01-08 LAB — BASIC METABOLIC PANEL
BUN/Creatinine Ratio: 14 (ref 9–23)
BUN: 10 mg/dL (ref 6–24)
CO2: 28 mmol/L (ref 20–29)
Calcium: 9.1 mg/dL (ref 8.7–10.2)
Chloride: 101 mmol/L (ref 96–106)
Creatinine, Ser: 0.69 mg/dL (ref 0.57–1.00)
GFR calc Af Amer: 110 mL/min/{1.73_m2} (ref >59)
GFR calc non Af Amer: 96 mL/min/{1.73_m2} (ref >59)
Glucose: 137 mg/dL — ABNORMAL HIGH (ref 65–99)
Potassium: 4.1 mmol/L (ref 3.5–5.2)
Sodium: 136 mmol/L (ref 134–144)

## 2019-01-08 LAB — SPECIMEN STATUS REPORT

## 2019-01-12 ENCOUNTER — Telehealth: Payer: Self-pay

## 2019-01-12 NOTE — Telephone Encounter (Addendum)
Left voice message requesting return call to review results.

## 2019-01-13 ENCOUNTER — Telehealth: Payer: Self-pay

## 2019-01-13 NOTE — Telephone Encounter (Addendum)
Phoned patient with lab results and instructions from Dr. Geraldo Pitter to switch to low fat/cholesterol diet, start crestor and follow-up liver lipid lab draw in 6 weeks. Pt states she has statin intolerance. Previously she started atorvastatin and had diarrhea for 3 months while taking it. She would like [place] to send her information about what a low fat diet is.   Pt has heard that there is a shot for high cholesterol and wonders if that is an option.    pls advise, tx

## 2019-01-13 NOTE — Addendum Note (Signed)
Addended by: Polly Cobia A on: 01/13/2019 11:34 AM   Modules accepted: Orders

## 2019-01-15 ENCOUNTER — Telehealth: Payer: Self-pay

## 2019-01-19 ENCOUNTER — Other Ambulatory Visit: Payer: Self-pay | Admitting: Cardiology

## 2019-01-19 ENCOUNTER — Telehealth: Payer: Self-pay

## 2019-01-19 MED ORDER — EZETIMIBE 10 MG PO TABS: 10.0000 mg | ORAL_TABLET | Freq: Every day | ORAL | 1 refills | Status: DC

## 2019-01-19 NOTE — Telephone Encounter (Signed)
Informed patient that since she has tried and failed Lipitor and Crestor due to side effects, Dr. Geraldo Pitter would like her to try Zetia next. Informed that injection for hyperlipidemia, Repatha is normally only covered after trying and failing 3 statins. She agrees to try Zetia. Rx sent to [facility] on [address]. Per request.

## 2019-01-19 NOTE — Telephone Encounter (Signed)
Error/ Duplicate

## 2019-01-19 NOTE — Addendum Note (Signed)
Addended by: Polly Cobia A on: 01/19/2019 09:31 AM   Modules accepted: Orders

## 2019-01-19 NOTE — Telephone Encounter (Addendum)
Pt informed insurance normally requires trying and failing statin therapy with 3 different statins before authorizing Repatha. Patient agrees to try Zetia and rx sent to [facility] on [address] per request.

## 2019-03-16 ENCOUNTER — Telehealth: Payer: Self-pay | Admitting: Cardiology

## 2019-03-16 NOTE — Telephone Encounter (Signed)
Has questions about having lab orders sent to [facility]

## 2019-03-17 NOTE — Telephone Encounter (Signed)
Patient informed that [facility] can see her requested lab orders from this office in [place]. No need to take paperwork. No further questions.

## 2019-03-19 LAB — CBC
Hematocrit: 33.6 % — ABNORMAL LOW (ref 34.0–46.6)
Hemoglobin: 10.3 g/dL — ABNORMAL LOW (ref 11.1–15.9)
MCH: 23.6 pg — ABNORMAL LOW (ref 26.6–33.0)
MCHC: 30.7 g/dL — ABNORMAL LOW (ref 31.5–35.7)
MCV: 77 fL — ABNORMAL LOW (ref 79–97)
Platelets: 306 10*3/uL (ref 150–450)
RBC: 4.37 x10E6/uL (ref 3.77–5.28)
RDW: 16 % — ABNORMAL HIGH (ref 11.7–15.4)
WBC: 8.3 10*3/uL (ref 3.4–10.8)

## 2019-03-19 LAB — BASIC METABOLIC PANEL
BUN/Creatinine Ratio: 20 (ref 12–28)
BUN: 15 mg/dL (ref 8–27)
CO2: 28 mmol/L (ref 20–29)
Calcium: 9.3 mg/dL (ref 8.7–10.3)
Chloride: 101 mmol/L (ref 96–106)
Creatinine, Ser: 0.74 mg/dL (ref 0.57–1.00)
GFR calc Af Amer: 102 mL/min/{1.73_m2} (ref >59)
GFR calc non Af Amer: 88 mL/min/{1.73_m2} (ref >59)
Glucose: 202 mg/dL — ABNORMAL HIGH (ref 65–99)
Potassium: 4.3 mmol/L (ref 3.5–5.2)
Sodium: 136 mmol/L (ref 134–144)

## 2019-03-23 ENCOUNTER — Telehealth: Payer: Self-pay | Admitting: Cardiology

## 2019-03-23 ENCOUNTER — Telehealth: Payer: Self-pay

## 2019-03-23 NOTE — Telephone Encounter (Signed)
Lab results relayed, copy sent to [facility].

## 2019-03-23 NOTE — Telephone Encounter (Signed)
-----   Message from Jenean Lindau, MD sent at 03/19/2019 11:47 AM EDT ----- The results of the study is unremarkable. Hemoglobin is stable. Please inform patient. I will discuss in detail at next appointment. Cc  primary care/referring physician Jenean Lindau, MD 03/19/2019 11:44 AM

## 2019-03-23 NOTE — Telephone Encounter (Signed)
Wants lab results

## 2019-03-24 ENCOUNTER — Other Ambulatory Visit: Payer: Self-pay

## 2019-03-24 MED ORDER — EZETIMIBE 10 MG PO TABS: ORAL_TABLET | ORAL | 1 refills | Status: DC

## 2019-03-25 ENCOUNTER — Telehealth: Payer: Self-pay | Admitting: Cardiology

## 2019-03-25 ENCOUNTER — Other Ambulatory Visit: Payer: Self-pay | Admitting: Cardiology

## 2019-03-25 LAB — HEPATIC FUNCTION PANEL
ALT: 13 IU/L (ref 0–32)
AST: 21 IU/L (ref 0–40)
Albumin: 3.6 g/dL — ABNORMAL LOW (ref 3.8–4.9)
Alkaline Phosphatase: 117 IU/L (ref 39–117)
Bilirubin Total: 0.4 mg/dL (ref 0.0–1.2)
Bilirubin, Direct: <0.1 mg/dL (ref 0.00–0.40)
Total Protein: 6.3 g/dL (ref 6.0–8.5)

## 2019-03-25 MED ORDER — EZETIMIBE 10 MG PO TABS: ORAL_TABLET | ORAL | 1 refills | Status: DC

## 2019-03-25 NOTE — Telephone Encounter (Signed)
Left message for patient to call back  

## 2019-03-25 NOTE — Addendum Note (Signed)
Addended by: Beckey Rutter on: 03/25/2019 10:20 AM   Modules accepted: Orders

## 2019-03-25 NOTE — Telephone Encounter (Signed)
Wants you to call her about her Zetia prescription

## 2019-05-04 ENCOUNTER — Telehealth: Payer: Self-pay

## 2019-05-04 ENCOUNTER — Other Ambulatory Visit: Payer: Self-pay

## 2019-05-04 ENCOUNTER — Encounter: Payer: Self-pay | Admitting: Cardiology

## 2019-05-04 ENCOUNTER — Ambulatory Visit (INDEPENDENT_AMBULATORY_CARE_PROVIDER_SITE_OTHER): Payer: Medicare HMO | Admitting: Cardiology

## 2019-05-04 VITALS — BP 122/98 | HR 77 | Ht 67.0 in | Wt 333.2 lb

## 2019-05-04 DIAGNOSIS — I2510 Atherosclerotic heart disease of native coronary artery without angina pectoris: Secondary | ICD-10-CM

## 2019-05-04 DIAGNOSIS — G4733 Obstructive sleep apnea (adult) (pediatric): Secondary | ICD-10-CM | POA: Diagnosis not present

## 2019-05-04 DIAGNOSIS — F17210 Nicotine dependence, cigarettes, uncomplicated: Secondary | ICD-10-CM

## 2019-05-04 DIAGNOSIS — R0600 Dyspnea, unspecified: Secondary | ICD-10-CM

## 2019-05-04 DIAGNOSIS — I10 Essential (primary) hypertension: Secondary | ICD-10-CM

## 2019-05-04 DIAGNOSIS — E1169 Type 2 diabetes mellitus with other specified complication: Secondary | ICD-10-CM

## 2019-05-04 DIAGNOSIS — E669 Obesity, unspecified: Secondary | ICD-10-CM

## 2019-05-04 HISTORY — DX: Nicotine dependence, cigarettes, uncomplicated: F17.210

## 2019-05-04 NOTE — Telephone Encounter (Signed)
[facility] health called with D-dimer 480, glucose 189, BUN 20 and creatinine 0.5.Information relayed to Dr. Docia Furl patient instructed to go home. Copy of labs given to [facility] for evaluation

## 2019-05-04 NOTE — Patient Instructions (Signed)
Medication Instructions:  Your physician recommends that you continue on your current medications as directed. Please refer to the Current Medication list given to you today.  *If you need a refill on your cardiac medications before your next appointment, please call your pharmacy*  Lab Work: Your physician recommends that you return for lab work today: bmp, d dimer (STAY AT [facility])   If you have labs (blood work) drawn today and your tests are completely normal, you will receive your results only by: Marland Kitchen MyChart Message (if you have MyChart) OR . A paper copy in the mail If you have any lab test that is abnormal or we need to change your treatment, we will call you to review the results.  Testing/Procedures: None.   Follow-Up: At [facility], you and your health needs are our priority.  As part of our continuing mission to provide you with exceptional heart care, we have created designated Provider Care Teams.  These Care Teams include your primary Cardiologist (physician) and Advanced Practice Providers (APPs -  Physician Assistants and Nurse Practitioners) who all work together to provide you with the care you need, when you need it.  Your next appointment:   6 month(s)  The format for your next appointment:   In Person  Provider:   Jyl Heinz, MD  Other Instructions

## 2019-05-04 NOTE — Addendum Note (Signed)
Addended by: Ashok Norris on: 05/04/2019 10:43 AM   Modules accepted: Orders

## 2019-05-04 NOTE — Progress Notes (Signed)
Cardiology Office Note:    Date:  05/04/2019   ID:  Marie Chen, DOB 01/20/1961, MRN [id number]  [facility]  [facility]  Cardiologist:  Jenean Lindau, MD   Referring MD: [facility]    ASSESSMENT:    1. Coronary artery disease involving native coronary artery of native heart without angina pectoris   2. Essential hypertension   3. Diabetes mellitus type 2 in obese (Marie Chen)   4. SLEEP APNEA, OBSTRUCTIVE   5. Cigarette smoker    PLAN:    In order of problems listed above:  1. Coronary artery disease: Secondary prevention stressed to the patient.  Importance of compliance with diet and medication stressed and she vocalized understanding. 2. Essential hypertension: Blood pressure is stable 3. Mixed dyslipidemia and diabetes mellitus: Diet was discussed.  Risks of obesity also explained and weight reduction was stressed and she promises to do better. 4. Cigarette smoking: I spent 5 minutes with the patient discussing solely about smoking. Smoking cessation was counseled. I suggested to the patient also different medications and pharmacological interventions. Patient is keen to try stopping on its own at this time. He will get back to me if he needs any further assistance in this matter. 5. She gives history of cold clammy skin and sweating recently.  She is doing better now but she is not completely back to normal.  I will send her to [facility] for a Chem-7 and a D-dimer.  Further recommendations will be made based on the findings of those tests.  They are unlikely to be from a cardiac origin given the recent coronary angiography report. 6. Patient will be seen in follow-up appointment in 6 months or earlier if the patient has any concerns    Medication Adjustments/Labs and Tests Ordered: Current medicines are reviewed at length with the patient today.  Concerns regarding medicines are outlined above.  No orders of the defined types were placed in this  encounter.  No orders of the defined types were placed in this encounter.    No chief complaint on file.    History of Present Illness:    Marie Chen is a 58 y.o. female.  Patient has past medical history of essential hypertension, dyslipidemia diabetes mellitus and morbid obesity and sleep apnea.  Unfortunately she is a cigarette smoker and continues to smoke.  Recent coronary angiography revealed nonobstructive disease.  She mentions to me that she felt cold and clammy and was sweating on Thursday.  Subsequently she has felt fine but tired.  At the time of my evaluation, the patient is alert awake oriented and in no distress.  Past Medical History:  Diagnosis Date   Asthma    Depression    Diabetes mellitus without complication ([place])    GERD (gastroesophageal reflux disease)    Hypertension    Migraine    Sjogren's disease ([employer])    Sleep apnea    bipap   Thyroid disease     Past Surgical History:  Procedure Laterality Date   ANKLE SURGERY     CARPAL TUNNEL RELEASE     CHOLECYSTECTOMY     HYSTEROSCOPY     LEFT HEART CATH AND CORONARY ANGIOGRAPHY N/A 12/19/2018   Procedure: LEFT HEART CATH AND CORONARY ANGIOGRAPHY;  Surgeon: Nelva Bush, MD;  Location: [place] [facility];  Service: Cardiovascular;  Laterality: N/A;    Current Medications: Current Meds  Medication Sig   aspirin EC 81 MG tablet Take 1 tablet (81  mg total) by mouth daily.   atorvastatin (LIPITOR) 40 MG tablet    calcium carbonate (TUMS - DOSED IN MG ELEMENTAL CALCIUM) 500 MG chewable tablet Chew 2 tablets by mouth daily as needed for indigestion or heartburn.   Carboxymethylcellul-Glycerin (LUBRICATING EYE DROPS OP) Place 1 drop into both eyes daily as needed (dry eyes).   cyclobenzaprine (FLEXERIL) 10 MG tablet Take 10 mg by mouth every 8 (eight) hours as needed for muscle spasms.    escitalopram (LEXAPRO) 10 MG tablet Take 10 mg by mouth at bedtime.    ezetimibe (ZETIA) 10  MG tablet TAKE 1 TABLET(10 MG) BY MOUTH DAILY   fluticasone (FLONASE) 50 MCG/ACT nasal spray Place 1 spray into both nostrils daily as needed (seasonal allergies).    fluticasone furoate-vilanterol (BREO ELLIPTA) 100-25 MCG/INH AEPB Inhale 1 puff into the lungs daily.   glycopyrrolate (ROBINUL) 2 MG tablet TAKE ONE TABLET BY MOUTH TWICE DAILY   HYDROcodone-acetaminophen (NORCO) 10-325 MG per tablet Take 1 tablet by mouth 3 (three) times daily as needed (pain).    insulin aspart (NOVOLOG FLEXPEN) 100 UNIT/ML FlexPen Inject 2-12 Units into the skin 3 (three) times daily with meals.    insulin glargine (LANTUS) 100 unit/mL SOPN Inject 69 Units into the skin daily before breakfast.    ipratropium-albuterol (DUONEB) 0.5-2.5 (3) MG/3ML SOLN Take 3 mLs by nebulization every 6 (six) hours as needed (shortness of breath/wheezing).   levothyroxine (SYNTHROID, LEVOTHROID) 112 MCG tablet Take 112 mcg by mouth daily before breakfast.    lisinopril-hydrochlorothiazide (PRINZIDE,ZESTORETIC) 20-25 MG tablet Take 1 tablet by mouth daily with breakfast.   Magnesium 400 MG TABS Take 400 mg by mouth every other day.   montelukast (SINGULAIR) 10 MG tablet Take 10 mg by mouth daily with breakfast.   naproxen sodium (ALEVE) 220 MG tablet Take 440 mg by mouth daily as needed (pain).   OVER THE COUNTER MEDICATION Take 300 mg by mouth at bedtime. CBD oil   pregabalin (LYRICA) 100 MG capsule Take 100 mg by mouth 4 (four) times daily.    terconazole (TERAZOL 7) 0.4 % vaginal cream Place 1 applicator vaginally at bedtime as needed (yeast infections).    topiramate (TOPAMAX) 25 MG tablet Take 25 mg by mouth at bedtime.   Vitamin D, Ergocalciferol, (DRISDOL) 1.25 MG (50000 UT) CAPS capsule Take 50,000 Units by mouth every Saturday.   [DISCONTINUED] Coenzyme Q10 200 MG capsule Take 400 mg by mouth daily.     Allergies:   Macrodantin [nitrofurantoin macrocrystal], Metformin and related, Hydromorphone hcl,  Lipitor [atorvastatin calcium], Meperidine hcl, and Tetracyclines & related   Social History   Socioeconomic History   Marital status: Widowed    Spouse name: Not on file   Number of children: 4   Years of education: Not on file   Highest education level: Not on file  Occupational History   Occupation: CSR    Employer: [employer]  Social Needs   Financial resource strain: Not on file   Food insecurity    Worry: Not on file    Inability: Not on file   Transportation needs    Medical: Not on file    Non-medical: Not on file  Tobacco Use   Smoking status: Former Smoker    Types: Cigarettes   Smokeless tobacco: Never Used  Substance and Sexual Activity   Alcohol use: No    Alcohol/week: 0.0 standard drinks   Drug use: No   Sexual activity: Not on file  Lifestyle  Physical activity    Days per week: Not on file    Minutes per session: Not on file   Stress: Not on file  Relationships   Social connections    Talks on phone: Not on file    Gets together: Not on file    Attends religious service: Not on file    Active member of club or organization: Not on file    Attends meetings of clubs or organizations: Not on file    Relationship status: Not on file  Other Topics Concern   Not on file  Social History Narrative   Not on file     Family History: The patient's family history includes Heart failure in her father. There is no history of Colon cancer.  ROS:   Please see the history of present illness.    All other systems reviewed and are negative.  EKGs/Labs/Other Studies Reviewed:    The following studies were reviewed today: LEFT HEART CATH AND CORONARY ANGIOGRAPHY  Conclusion  Conclusions: 1. Mild to moderate, non-obstructive coronary artery disease, including 30% mid LAD stenosis, 30-40% ostial D1 lesion, 20% ostial LCx stenosis, and 30% ostial and 20% mid RCA stenosis. 2. Mildly elevated left-ventricular filling pressure with normal left  ventricular contraction. 3. Catheter-induced LBBB during left heart catheterization.  Recommendations: 1. Medical therapy and risk factor modification to prevent progression of coronary artery disease. 2. Post-cath telemetry monitoring, given catheter-induced LBBB at the end of the procedure. 3. Consider outpatient evaluation of other alternative causes of chest pain.  Yvonne Kendall, MD [facility] Pager: [phone]      Recent Labs: 05/12/2018: Magnesium 1.6 03/18/2019: BUN 15; Creatinine, Ser 0.74; Hemoglobin 10.3; Platelets 306; Potassium 4.3; Sodium 136 03/25/2019: ALT 13  Recent Lipid Panel    Component Value Date/Time   CHOL 179 01/07/2019 1019   TRIG 112 01/07/2019 1019   HDL 34 (L) 01/07/2019 1019   CHOLHDL 6 03/14/2009 1147   VLDL 25.6 03/14/2009 1147   LDLCALC 123 (H) 01/07/2019 1019   LDLDIRECT 179.5 03/14/2009 1147    Physical Exam:    VS:  BP (!) 122/98 (BP Location: Left Arm, Patient Position: Sitting, Cuff Size: Large)    Pulse 77    Ht 5\' 7"  (1.702 m)    Wt (!) 333 lb 3.2 oz (151.1 kg)    SpO2 95%    BMI 52.19 kg/m     Wt Readings from Last 3 Encounters:  05/04/19 (!) 333 lb 3.2 oz (151.1 kg)  01/01/19 (!) 328 lb (148.8 kg)  12/19/18 (!) 310 lb (140.6 kg)     GEN: Patient is in no acute distress HEENT: Normal NECK: No JVD; No carotid bruits LYMPHATICS: No lymphadenopathy CARDIAC: Hear sounds regular, 2/6 systolic murmur at the apex. RESPIRATORY:  Clear to auscultation without rales, wheezing or rhonchi  ABDOMEN: Soft, non-tender, non-distended MUSCULOSKELETAL:  No edema; No deformity  SKIN: Warm and dry NEUROLOGIC:  Alert and oriented x 3 PSYCHIATRIC:  Normal affect   Signed, 02/19/19, MD  05/04/2019 10:25 AM    [facility]

## 2019-06-16 DIAGNOSIS — U071 COVID-19: Secondary | ICD-10-CM

## 2019-06-16 DIAGNOSIS — J9601 Acute respiratory failure with hypoxia: Secondary | ICD-10-CM

## 2019-06-17 DIAGNOSIS — U071 COVID-19: Secondary | ICD-10-CM | POA: Diagnosis not present

## 2019-06-17 DIAGNOSIS — J9601 Acute respiratory failure with hypoxia: Secondary | ICD-10-CM | POA: Diagnosis not present

## 2019-06-18 ENCOUNTER — Inpatient Hospital Stay (HOSPITAL_COMMUNITY)
Admission: AD | Admit: 2019-06-18 | Discharge: 2019-06-20 | DRG: 177 | Disposition: A | Discharge status: Home / Self Care | Payer: Medicare HMO | Source: Other Acute Inpatient Hospital | Attending: Family Medicine | Admitting: Family Medicine

## 2019-06-18 ENCOUNTER — Other Ambulatory Visit: Payer: Self-pay

## 2019-06-18 ENCOUNTER — Encounter (HOSPITAL_COMMUNITY): Payer: Self-pay | Admitting: Internal Medicine

## 2019-06-18 DIAGNOSIS — Z7982 Long term (current) use of aspirin: Secondary | ICD-10-CM

## 2019-06-18 DIAGNOSIS — I10 Essential (primary) hypertension: Secondary | ICD-10-CM | POA: Diagnosis present

## 2019-06-18 DIAGNOSIS — I2510 Atherosclerotic heart disease of native coronary artery without angina pectoris: Secondary | ICD-10-CM | POA: Diagnosis present

## 2019-06-18 DIAGNOSIS — J449 Chronic obstructive pulmonary disease, unspecified: Secondary | ICD-10-CM | POA: Diagnosis present

## 2019-06-18 DIAGNOSIS — E1169 Type 2 diabetes mellitus with other specified complication: Secondary | ICD-10-CM

## 2019-06-18 DIAGNOSIS — Z794 Long term (current) use of insulin: Secondary | ICD-10-CM

## 2019-06-18 DIAGNOSIS — Z881 Allergy status to other antibiotic agents status: Secondary | ICD-10-CM | POA: Diagnosis not present

## 2019-06-18 DIAGNOSIS — G43909 Migraine, unspecified, not intractable, without status migrainosus: Secondary | ICD-10-CM | POA: Diagnosis present

## 2019-06-18 DIAGNOSIS — Z7951 Long term (current) use of inhaled steroids: Secondary | ICD-10-CM | POA: Diagnosis not present

## 2019-06-18 DIAGNOSIS — M3500 Sicca syndrome, unspecified: Secondary | ICD-10-CM | POA: Diagnosis present

## 2019-06-18 DIAGNOSIS — E785 Hyperlipidemia, unspecified: Secondary | ICD-10-CM | POA: Diagnosis present

## 2019-06-18 DIAGNOSIS — Z8249 Family history of ischemic heart disease and other diseases of the circulatory system: Secondary | ICD-10-CM

## 2019-06-18 DIAGNOSIS — K219 Gastro-esophageal reflux disease without esophagitis: Secondary | ICD-10-CM | POA: Diagnosis present

## 2019-06-18 DIAGNOSIS — E1165 Type 2 diabetes mellitus with hyperglycemia: Secondary | ICD-10-CM | POA: Diagnosis not present

## 2019-06-18 DIAGNOSIS — Z79890 Hormone replacement therapy (postmenopausal): Secondary | ICD-10-CM

## 2019-06-18 DIAGNOSIS — J1282 Pneumonia due to coronavirus disease 2019: Secondary | ICD-10-CM | POA: Diagnosis present

## 2019-06-18 DIAGNOSIS — F329 Major depressive disorder, single episode, unspecified: Secondary | ICD-10-CM | POA: Diagnosis not present

## 2019-06-18 DIAGNOSIS — U071 COVID-19: Secondary | ICD-10-CM

## 2019-06-18 DIAGNOSIS — Z79899 Other long term (current) drug therapy: Secondary | ICD-10-CM

## 2019-06-18 DIAGNOSIS — G4733 Obstructive sleep apnea (adult) (pediatric): Secondary | ICD-10-CM | POA: Diagnosis present

## 2019-06-18 DIAGNOSIS — F17210 Nicotine dependence, cigarettes, uncomplicated: Secondary | ICD-10-CM | POA: Diagnosis present

## 2019-06-18 DIAGNOSIS — T380X5A Adverse effect of glucocorticoids and synthetic analogues, initial encounter: Secondary | ICD-10-CM | POA: Diagnosis not present

## 2019-06-18 DIAGNOSIS — E039 Hypothyroidism, unspecified: Secondary | ICD-10-CM | POA: Diagnosis present

## 2019-06-18 DIAGNOSIS — Z23 Encounter for immunization: Secondary | ICD-10-CM

## 2019-06-18 DIAGNOSIS — J9601 Acute respiratory failure with hypoxia: Secondary | ICD-10-CM

## 2019-06-18 DIAGNOSIS — Z6841 Body Mass Index (BMI) 40.0 and over, adult: Secondary | ICD-10-CM

## 2019-06-18 DIAGNOSIS — E669 Obesity, unspecified: Secondary | ICD-10-CM

## 2019-06-18 DIAGNOSIS — F32A Depression, unspecified: Secondary | ICD-10-CM | POA: Diagnosis present

## 2019-06-18 DIAGNOSIS — J410 Simple chronic bronchitis: Secondary | ICD-10-CM | POA: Diagnosis not present

## 2019-06-18 DIAGNOSIS — Z888 Allergy status to other drugs, medicaments and biological substances status: Secondary | ICD-10-CM

## 2019-06-18 HISTORY — DX: COVID-19: U07.1

## 2019-06-18 HISTORY — DX: Acute respiratory failure with hypoxia: J96.01

## 2019-06-18 HISTORY — DX: Chronic obstructive pulmonary disease, unspecified: J44.9

## 2019-06-18 LAB — GLUCOSE, CAPILLARY: Glucose-Capillary: 389 mg/dL — ABNORMAL HIGH (ref 70–99)

## 2019-06-18 MED ORDER — HYDROCODONE-ACETAMINOPHEN 10-325 MG PO TABS: 1.0000 | ORAL_TABLET | Freq: Three times a day (TID) | ORAL | Status: DC | PRN

## 2019-06-18 MED ORDER — ZINC SULFATE 220 (50 ZN) MG PO CAPS
220.0000 mg | ORAL_CAPSULE | Freq: Every day | ORAL | Status: DC
  Administered 2019-06-19 – 2019-06-20 (×2): 220 mg via ORAL
  Filled 2019-06-18 (×2): qty 1

## 2019-06-18 MED ORDER — GUAIFENESIN-DM 100-10 MG/5ML PO SYRP: 10.0000 mL | ORAL_SOLUTION | ORAL | Status: DC | PRN

## 2019-06-18 MED ORDER — ASCORBIC ACID 500 MG PO TABS
500.0000 mg | ORAL_TABLET | Freq: Every day | ORAL | Status: DC
  Administered 2019-06-19 – 2019-06-20 (×2): 500 mg via ORAL
  Filled 2019-06-18 (×2): qty 1

## 2019-06-18 MED ORDER — LEVOTHYROXINE SODIUM 112 MCG PO TABS
112.0000 ug | ORAL_TABLET | Freq: Every day | ORAL | Status: DC
  Administered 2019-06-19 – 2019-06-20 (×2): 112 ug via ORAL
  Filled 2019-06-18 (×3): qty 1

## 2019-06-18 MED ORDER — INSULIN GLARGINE 100 UNIT/ML ~~LOC~~ SOLN
30.0000 [IU] | Freq: Two times a day (BID) | SUBCUTANEOUS | Status: DC
  Administered 2019-06-18 – 2019-06-20 (×4): 30 [IU] via SUBCUTANEOUS
  Filled 2019-06-18 (×5): qty 0.3

## 2019-06-18 MED ORDER — ENOXAPARIN SODIUM 80 MG/0.8ML ~~LOC~~ SOLN
75.0000 mg | SUBCUTANEOUS | Status: DC
  Administered 2019-06-19: 75 mg via SUBCUTANEOUS
  Filled 2019-06-18: qty 0.8

## 2019-06-18 MED ORDER — HYDROCOD POLST-CPM POLST ER 10-8 MG/5ML PO SUER: 5.0000 mL | Freq: Two times a day (BID) | ORAL | Status: DC | PRN

## 2019-06-18 MED ORDER — IPRATROPIUM-ALBUTEROL 20-100 MCG/ACT IN AERS
1.0000 | INHALATION_SPRAY | Freq: Four times a day (QID) | RESPIRATORY_TRACT | Status: DC
  Administered 2019-06-19 – 2019-06-20 (×6): 1 via RESPIRATORY_TRACT
  Filled 2019-06-18: qty 4

## 2019-06-18 MED ORDER — LISINOPRIL-HYDROCHLOROTHIAZIDE 20-25 MG PO TABS: 1.0000 | ORAL_TABLET | Freq: Every day | ORAL | Status: DC

## 2019-06-18 MED ORDER — LISINOPRIL 20 MG PO TABS
20.0000 mg | ORAL_TABLET | Freq: Every day | ORAL | Status: DC
  Administered 2019-06-19 – 2019-06-20 (×2): 20 mg via ORAL
  Filled 2019-06-18 (×2): qty 1

## 2019-06-18 MED ORDER — DOCUSATE SODIUM 100 MG PO CAPS
100.0000 mg | ORAL_CAPSULE | Freq: Every day | ORAL | Status: DC
  Administered 2019-06-19 – 2019-06-20 (×2): 100 mg via ORAL
  Filled 2019-06-18 (×2): qty 1

## 2019-06-18 MED ORDER — FLUTICASONE FUROATE-VILANTEROL 100-25 MCG/INH IN AEPB
1.0000 | INHALATION_SPRAY | Freq: Every day | RESPIRATORY_TRACT | Status: DC
  Administered 2019-06-19 – 2019-06-20 (×2): 1 via RESPIRATORY_TRACT
  Filled 2019-06-18: qty 28

## 2019-06-18 MED ORDER — INFLUENZA VAC SPLIT QUAD 0.5 ML IM SUSY
0.5000 mL | PREFILLED_SYRINGE | INTRAMUSCULAR | Status: AC
  Administered 2019-06-19: 0.5 mL via INTRAMUSCULAR
  Filled 2019-06-18: qty 0.5

## 2019-06-18 MED ORDER — ESCITALOPRAM OXALATE 10 MG PO TABS
20.0000 mg | ORAL_TABLET | Freq: Every day | ORAL | Status: DC
  Administered 2019-06-18 – 2019-06-19 (×2): 20 mg via ORAL
  Filled 2019-06-18 (×2): qty 2

## 2019-06-18 MED ORDER — PREGABALIN 100 MG PO CAPS
200.0000 mg | ORAL_CAPSULE | Freq: Three times a day (TID) | ORAL | Status: DC
  Administered 2019-06-18 – 2019-06-20 (×5): 200 mg via ORAL
  Filled 2019-06-18 (×5): qty 2

## 2019-06-18 MED ORDER — TOPIRAMATE 25 MG PO TABS
25.0000 mg | ORAL_TABLET | Freq: Every day | ORAL | Status: DC
  Administered 2019-06-18 – 2019-06-19 (×2): 25 mg via ORAL
  Filled 2019-06-18 (×3): qty 1

## 2019-06-18 MED ORDER — HYDROCHLOROTHIAZIDE 25 MG PO TABS
25.0000 mg | ORAL_TABLET | Freq: Every day | ORAL | Status: DC
  Administered 2019-06-19 – 2019-06-20 (×2): 25 mg via ORAL
  Filled 2019-06-18 (×2): qty 1

## 2019-06-18 MED ORDER — MONTELUKAST SODIUM 10 MG PO TABS
10.0000 mg | ORAL_TABLET | Freq: Every day | ORAL | Status: DC
  Administered 2019-06-19: 10 mg via ORAL
  Filled 2019-06-18: qty 1

## 2019-06-18 MED ORDER — DEXAMETHASONE 6 MG PO TABS
6.0000 mg | ORAL_TABLET | ORAL | Status: DC
  Administered 2019-06-19 – 2019-06-20 (×2): 6 mg via ORAL
  Filled 2019-06-18 (×2): qty 1

## 2019-06-18 MED ORDER — HYDROCODONE-ACETAMINOPHEN 5-325 MG PO TABS: 2.0000 | ORAL_TABLET | Freq: Three times a day (TID) | ORAL | Status: DC | PRN

## 2019-06-18 MED ORDER — ACETAMINOPHEN 325 MG PO TABS: 650.0000 mg | ORAL_TABLET | Freq: Four times a day (QID) | ORAL | Status: DC | PRN

## 2019-06-18 NOTE — Plan of Care (Signed)
  Problem: Education: Goal: Knowledge of risk factors and measures for prevention of condition will improve Outcome: Progressing   Problem: Coping: Goal: Psychosocial and spiritual needs will be supported Outcome: Progressing   Problem: Respiratory: Goal: Will maintain a patent airway Outcome: Progressing Goal: Complications related to the disease process, condition or treatment will be avoided or minimized Outcome: Progressing   

## 2019-06-19 ENCOUNTER — Encounter (HOSPITAL_COMMUNITY): Payer: Self-pay | Admitting: Internal Medicine

## 2019-06-19 DIAGNOSIS — J449 Chronic obstructive pulmonary disease, unspecified: Secondary | ICD-10-CM | POA: Diagnosis present

## 2019-06-19 DIAGNOSIS — J9601 Acute respiratory failure with hypoxia: Secondary | ICD-10-CM

## 2019-06-19 DIAGNOSIS — K219 Gastro-esophageal reflux disease without esophagitis: Secondary | ICD-10-CM | POA: Diagnosis present

## 2019-06-19 DIAGNOSIS — U071 COVID-19: Principal | ICD-10-CM

## 2019-06-19 DIAGNOSIS — I10 Essential (primary) hypertension: Secondary | ICD-10-CM | POA: Diagnosis present

## 2019-06-19 DIAGNOSIS — F329 Major depressive disorder, single episode, unspecified: Secondary | ICD-10-CM | POA: Diagnosis present

## 2019-06-19 HISTORY — DX: Chronic obstructive pulmonary disease, unspecified: J44.9

## 2019-06-19 LAB — CBC WITH DIFFERENTIAL/PLATELET
Abs Immature Granulocytes: 0.04 10*3/uL (ref 0.00–0.07)
Basophils Absolute: 0 10*3/uL (ref 0.0–0.1)
Basophils Relative: 0 %
Eosinophils Absolute: 0 10*3/uL (ref 0.0–0.5)
Eosinophils Relative: 0 %
HCT: 35.8 % — ABNORMAL LOW (ref 36.0–46.0)
Hemoglobin: 10.8 g/dL — ABNORMAL LOW (ref 12.0–15.0)
Immature Granulocytes: 1 %
Lymphocytes Relative: 16 %
Lymphs Abs: 1.3 10*3/uL (ref 0.7–4.0)
MCH: 23 pg — ABNORMAL LOW (ref 26.0–34.0)
MCHC: 30.2 g/dL (ref 30.0–36.0)
MCV: 76.2 fL — ABNORMAL LOW (ref 80.0–100.0)
Monocytes Absolute: 0.6 10*3/uL (ref 0.1–1.0)
Monocytes Relative: 7 %
Neutro Abs: 6.4 10*3/uL (ref 1.7–7.7)
Neutrophils Relative %: 76 %
Platelets: 280 10*3/uL (ref 150–400)
RBC: 4.7 MIL/uL (ref 3.87–5.11)
RDW: 17.8 % — ABNORMAL HIGH (ref 11.5–15.5)
WBC: 8.4 10*3/uL (ref 4.0–10.5)
nRBC: 0 % (ref 0.0–0.2)

## 2019-06-19 LAB — COMPREHENSIVE METABOLIC PANEL
ALT: 16 U/L (ref 0–44)
AST: 17 U/L (ref 15–41)
Albumin: 2.9 g/dL — ABNORMAL LOW (ref 3.5–5.0)
Alkaline Phosphatase: 86 U/L (ref 38–126)
Anion gap: 13 (ref 5–15)
BUN: 24 mg/dL — ABNORMAL HIGH (ref 6–20)
CO2: 24 mmol/L (ref 22–32)
Calcium: 8.8 mg/dL — ABNORMAL LOW (ref 8.9–10.3)
Chloride: 96 mmol/L — ABNORMAL LOW (ref 98–111)
Creatinine, Ser: 0.66 mg/dL (ref 0.44–1.00)
GFR calc Af Amer: >60 mL/min (ref >60)
GFR calc non Af Amer: >60 mL/min (ref >60)
Glucose, Bld: 331 mg/dL — ABNORMAL HIGH (ref 70–99)
Potassium: 3.7 mmol/L (ref 3.5–5.1)
Sodium: 133 mmol/L — ABNORMAL LOW (ref 135–145)
Total Bilirubin: 0.8 mg/dL (ref 0.3–1.2)
Total Protein: 6.4 g/dL — ABNORMAL LOW (ref 6.5–8.1)

## 2019-06-19 LAB — D-DIMER, QUANTITATIVE: D-Dimer, Quant: 0.42 ug/mL-FEU (ref 0.00–0.50)

## 2019-06-19 LAB — GLUCOSE, CAPILLARY
Glucose-Capillary: 345 mg/dL — ABNORMAL HIGH (ref 70–99)
Glucose-Capillary: 240 mg/dL — ABNORMAL HIGH (ref 70–99)
Glucose-Capillary: 178 mg/dL — ABNORMAL HIGH (ref 70–99)
Glucose-Capillary: 296 mg/dL — ABNORMAL HIGH (ref 70–99)
Glucose-Capillary: 340 mg/dL — ABNORMAL HIGH (ref 70–99)
Glucose-Capillary: 411 mg/dL — ABNORMAL HIGH (ref 70–99)

## 2019-06-19 LAB — BRAIN NATRIURETIC PEPTIDE: B Natriuretic Peptide: 49 pg/mL (ref 0.0–100.0)

## 2019-06-19 LAB — C-REACTIVE PROTEIN: CRP: 0.9 mg/dL (ref <1.0)

## 2019-06-19 LAB — HIV ANTIBODY (ROUTINE TESTING W REFLEX): HIV Screen 4th Generation wRfx: NONREACTIVE

## 2019-06-19 LAB — FERRITIN: Ferritin: 23 ng/mL (ref 11–307)

## 2019-06-19 LAB — PROCALCITONIN: Procalcitonin: <0.1 ng/mL

## 2019-06-19 LAB — HEMOGLOBIN A1C
Hgb A1c MFr Bld: 10.5 % — ABNORMAL HIGH (ref 4.8–5.6)
Mean Plasma Glucose: 254.65 mg/dL

## 2019-06-19 MED ORDER — INSULIN ASPART 100 UNIT/ML ~~LOC~~ SOLN
10.0000 [IU] | Freq: Once | SUBCUTANEOUS | Status: AC
  Administered 2019-06-19: 10 [IU] via SUBCUTANEOUS

## 2019-06-19 MED ORDER — INSULIN ASPART 100 UNIT/ML ~~LOC~~ SOLN
0.0000 [IU] | Freq: Every day | SUBCUTANEOUS | Status: DC
  Administered 2019-06-19: 5 [IU] via SUBCUTANEOUS

## 2019-06-19 MED ORDER — SODIUM CHLORIDE 0.9 % IV SOLN
100.0000 mg | Freq: Every day | INTRAVENOUS | Status: AC
  Administered 2019-06-19 – 2019-06-20 (×2): 100 mg via INTRAVENOUS
  Filled 2019-06-19 (×2): qty 20

## 2019-06-19 MED ORDER — INSULIN ASPART 100 UNIT/ML ~~LOC~~ SOLN
0.0000 [IU] | Freq: Three times a day (TID) | SUBCUTANEOUS | Status: DC
  Administered 2019-06-19: 11 [IU] via SUBCUTANEOUS
  Administered 2019-06-19: 4 [IU] via SUBCUTANEOUS
  Administered 2019-06-19: 15 [IU] via SUBCUTANEOUS
  Administered 2019-06-20: 07:00:00 7 [IU] via SUBCUTANEOUS

## 2019-06-19 NOTE — Plan of Care (Signed)
  Problem: Education: Goal: Knowledge of risk factors and measures for prevention of condition will improve Outcome: Progressing   Problem: Coping: Goal: Psychosocial and spiritual needs will be supported Outcome: Progressing   Problem: Respiratory: Goal: Will maintain a patent airway Outcome: Progressing Goal: Complications related to the disease process, condition or treatment will be avoided or minimized Outcome: Progressing   

## 2019-06-19 NOTE — Progress Notes (Signed)
Pt BG elevated 389 on admit, states that she normally is in the 200-300 range.  MD informed given 5 units per order.  BG retaken at later time, still elevated number of 345.  MD notified order for additional 10 units received and given.  Pt rechecked BG now 245.  Will pass on information to oncoming RN.

## 2019-06-19 NOTE — Progress Notes (Signed)
Pt transferred from [place] - spoke briefly with the pharmacist at [facility] was on the following medications: Azith 500mg  IV q24h (1/5>>1/7) last dose ~0100 Rocephin 1gm IV q24h (1/5>>1/7) last dose ~0100 Remdesivir (1/5>>1/7) last dose 0210 Actemra 800mg   1/5 1141 Decadron 6mg  IV q6h - last dose 1/7 1524 Lovenox 75mg  SQ q12h - last dose 1/7 1228  SCr 0.6   To continue remdesivir course upon admission here Remdesivir 100mg  IV q24h x 2 more doses.  , PharmD, BCPS Please see amion for complete clinical pharmacist phone list 06/19/2019 12:29 AM

## 2019-06-19 NOTE — H&P (Signed)
TRH H&P   Patient Demographics:    Marie Chen, is a 59 y.o. female  MRN: [id number]   DOB - [birth year]  Admit Date - 06/18/2019  Outpatient Primary MD for the patient is [facility]  Patient coming from: [facility]  No chief complaint on file.     HPI:    Marie Chen  is a 59 y.o. female, with COPD, asthma, IDDM, HTN, HLD, CAD, hypothyroidism who presents as a transfer from [place] where she presented 1/5 with worsening shortness of breath, found to be in respiratory failure with hypoxia and SARS-CoV-2 positive.  At baseline she has COPD, does not use oxygen, on nightly BiPAP for OSA, she was hypoxic on presentation with sats in the 70s, implantable markers were elevated, started on Rocephin, azithromycin, remdesivir, Actemra, Decadron, Lovenox from 0.5 mg/kg BID.  Initially on 15 L [place] O2, subsequently weaned down to 2 L at the time of transfer.  She reports nonproductive cough.  She denies any orthopnea, PND, lower extremity edema.  No recent fevers or chills. CRP 65.7>>> 54.1>>> 34.1 D-dimer 762>>> LDH 8.7>>> 759>>> 761 Procalcitonin 0.16 Lactic acid 1.3  CT chest: No PE, multifocal opacities consistent with viral pneumonia.  Aortic atherosclerosis   Review of systems:  Review of Systems:  Constitutional: see HPI HEENT: negative for earaches, epistaxis, or sore throat Respiratory: see HPI Cardiovascular: negative for chest pain, palpitations, or syncope GU: negative for dysuria, urinary frequency, urinary urgency, hematuria Gastrointestinal: negative for abdominal pain, constipation, diarrhea, nausea or vomiting Musculoskeletal: negative for arthralgias, back pain or  myalgias Neurological: negative for dizziness, headaches or weakness Behavioral/Psych: negative for suicidal or homicidal ideation Skin:negative for rash Heme: negative for bruises Endo: negative for hair loss, weight gain/loss  With Past History of the following :   Past Medical History:  Diagnosis Date  . Asthma   . COPD (chronic obstructive pulmonary disease) ([place])   . Depression   . Diabetes mellitus without complication ([place])   . GERD (gastroesophageal reflux disease)   . Hypertension   . Migraine   . Sjogren's disease ([place])   . Sleep apnea    bipap  . Thyroid disease       Past Surgical History:  Procedure Laterality Date  . ANKLE SURGERY    . CARPAL TUNNEL RELEASE    .  CHOLECYSTECTOMY    . HYSTEROSCOPY    . LEFT HEART CATH AND CORONARY ANGIOGRAPHY N/A 12/19/2018   Procedure: LEFT HEART CATH AND CORONARY ANGIOGRAPHY;  Surgeon: Yvonne Kendall, MD;  Location: [facility] INVASIVE CV LAB;  Service: Cardiovascular;  Laterality: N/A;     Social History:     Social History   Tobacco Use  . Smoking status: Current Every Day Smoker    Types: Cigarettes  . Smokeless tobacco: Never Used  Substance Use Topics  . Alcohol use: No    Alcohol/week: 0.0 standard drinks      Family History :     Family History  Problem Relation Age of Onset  . Heart failure Father   . Colon cancer Neg Hx       Home Medications:   Prior to Admission medications   Medication Sig Start Date End Date Taking? Authorizing Provider  aspirin EC 81 MG tablet Take 1 tablet (81 mg total) by mouth daily. 12/04/18   Revankar, Aundra Dubin, MD  atorvastatin (LIPITOR) 40 MG tablet  03/11/19   [provider]  calcium carbonate (TUMS - DOSED IN MG ELEMENTAL CALCIUM) 500 MG chewable tablet Chew 2 tablets by mouth daily as needed for indigestion or heartburn.    [provider]  Carboxymethylcellul-Glycerin (LUBRICATING EYE DROPS OP) Place 1 drop into both eyes daily as needed (dry eyes).     [provider]  cyclobenzaprine (FLEXERIL) 10 MG tablet Take 10 mg by mouth every 8 (eight) hours as needed for muscle spasms.  04/14/18   [provider]  escitalopram (LEXAPRO) 10 MG tablet Take 20 mg by mouth at bedtime.    [provider]  ezetimibe (ZETIA) 10 MG tablet TAKE 1 TABLET(10 MG) BY MOUTH DAILY 03/25/19   Revankar, Aundra Dubin, MD  fluticasone (FLONASE) 50 MCG/ACT nasal spray Place 1 spray into both nostrils daily as needed (seasonal allergies).     [provider]  fluticasone furoate-vilanterol (BREO ELLIPTA) 100-25 MCG/INH AEPB Inhale 1 puff into the lungs daily.    [provider]  glycopyrrolate (ROBINUL) 2 MG tablet TAKE ONE TABLET BY MOUTH TWICE DAILY 06/29/14   Esterwood, Amy S, [name]  HYDROcodone-acetaminophen (NORCO) 10-325 MG per tablet Take 1 tablet by mouth 3 (three) times daily as needed (pain).     [provider]  insulin aspart (NOVOLOG FLEXPEN) 100 UNIT/ML FlexPen Inject 2-12 Units into the skin 3 (three) times daily with meals.     [provider]  insulin glargine (LANTUS) 100 unit/mL SOPN Inject 85 Units into the skin daily before breakfast.    [provider]  ipratropium-albuterol (DUONEB) 0.5-2.5 (3) MG/3ML SOLN Take 3 mLs by nebulization every 6 (six) hours as needed (shortness of breath/wheezing).    [provider]  levothyroxine (SYNTHROID, LEVOTHROID) 112 MCG tablet Take 112 mcg by mouth daily before breakfast.     [provider]  lisinopril-hydrochlorothiazide (PRINZIDE,ZESTORETIC) 20-25 MG tablet Take 1 tablet by mouth daily with breakfast. 05/16/18   Richarda Overlie, MD  Magnesium 400 MG TABS Take 400 mg by mouth every other day.    [provider]  montelukast (SINGULAIR) 10 MG tablet Take 10 mg by mouth daily with breakfast.    [provider]  naproxen sodium (ALEVE) 220 MG tablet Take 440 mg by mouth daily as needed (pain).    [provider]    nitroGLYCERIN (NITROSTAT) 0.4 MG SL tablet Place 1 tablet (0.4 mg total) under the tongue every 5 (  five) minutes as needed. Patient taking differently: Place 0.4 mg under the tongue every 5 (five) minutes as needed for chest pain.  12/04/18 03/04/19  Revankar, Aundra Dubin, MD  OVER THE COUNTER MEDICATION Take 300 mg by mouth at bedtime. CBD oil    [provider]  pregabalin (LYRICA) 100 MG capsule Take 200 mg by mouth 3 (three) times daily.    [provider]  terconazole (TERAZOL 7) 0.4 % vaginal cream Place 1 applicator vaginally at bedtime as needed (yeast infections).  10/25/17   [provider]  topiramate (TOPAMAX) 25 MG tablet Take 25 mg by mouth at bedtime. 03/26/18   [provider]  Vitamin D, Ergocalciferol, (DRISDOL) 1.25 MG (50000 UT) CAPS capsule Take 50,000 Units by mouth every Saturday.    [provider]     Allergies:     Allergies  Allergen Reactions  . Macrodantin [Nitrofurantoin Macrocrystal] Rash  . Metformin And Related Other (See Comments)    Causes yeast infections  . Hydromorphone Hcl Nausea And Vomiting  . Lipitor [Atorvastatin Calcium] Diarrhea  . Meperidine Hcl Nausea Only    Is ok if given with Phenergan  . Tetracyclines & Related Rash     Physical Exam:   Vitals  Blood pressure 111/74, pulse 70, temperature 98.7 F (37.1 C), temperature source Oral, resp. rate 19, height 5\' 7"  (1.702 m), weight (!) 151 kg, SpO2 (!) 89 %.  Physical Exam   Constitutional - resting comfortably, no acute distress Eyes - pupils equal round and reactive to light and accomodation, extra ocular movements intact Nose - no gross deformity or drainage Mouth - no oral lesions noted Throat - no swelling or erythema Neck - supple, no JVD   CV - (+)S1S2, no murmurs  Resp - CTA bilaterally, no wheezing or crackles,  GI - (+)BS, soft, non-tender, non-distended Extrem - no clubbing, cyanosis, or peripheral edema  Skin - no rashes or  wounds Neuro - alert, aware, oriented to person/place/time  Psych - normal affect, no anxiety   Patient has Pressure Ulcer on Admission?: no   Data Review:    CBC No results for input(s): WBC, HGB, HCT, PLT, MCV, MCH, MCHC, RDW, LYMPHSABS, MONOABS, EOSABS, BASOSABS, BANDABS in the last 168 hours.  Invalid input(s): NEUTRABS, BANDSABD ------------------------------------------------------------------------------------------------------------------  Chemistries  No results for input(s): NA, K, CL, CO2, GLUCOSE, BUN, CREATININE, CALCIUM, MG, AST, ALT, ALKPHOS, BILITOT in the last 168 hours.  Invalid input(s): GFRCGP ------------------------------------------------------------------------------------------------------------------ CrCl cannot be calculated (Patient's most recent lab result is older than the maximum 21 days allowed.). ------------------------------------------------------------------------------------------------------------------ No results for input(s): TSH, T4TOTAL, T3FREE, THYROIDAB in the last 72 hours.  Invalid input(s): FREET3  Coagulation profile No results for input(s): INR, PROTIME in the last 168 hours. ------------------------------------------------------------------------------------------------------------------- No results for input(s): DDIMER in the last 72 hours. -------------------------------------------------------------------------------------------------------------------  Cardiac Enzymes No results for input(s): CKMB, TROPONINI, MYOGLOBIN in the last 168 hours.  Invalid input(s): CK ------------------------------------------------------------------------------------------------------------------ No results found for: BNP   ---------------------------------------------------------------------------------------------------------------  Urinalysis    Component Value Date/Time   COLORURINE YELLOW 05/11/2018 0649   APPEARANCEUR CLEAR  05/11/2018 0649   LABSPEC 1.044 (H) 05/11/2018 0649   PHURINE 6.0 05/11/2018 0649   GLUCOSEU NEGATIVE 05/11/2018 0649   GLUCOSEU NEGATIVE 03/14/2009 1147   HGBUR NEGATIVE 05/11/2018 0649   BILIRUBINUR NEGATIVE 05/11/2018 0649   KETONESUR NEGATIVE 05/11/2018 0649   PROTEINUR NEGATIVE 05/11/2018 0649   UROBILINOGEN 0.2 03/14/2009 1147   NITRITE NEGATIVE 05/11/2018 0649   LEUKOCYTESUR SMALL (A)  05/11/2018 0649    ----------------------------------------------------------------------------------------------------------------   Imaging Results:    No results found.   Assessment & Plan:    Principal Problem:   Acute hypoxemic respiratory failure due to severe acute respiratory syndrome coronavirus 2 (SARS-CoV-2) disease (HCC) Active Problems:   Morbid obesity (HCC)   Hypertension   GERD (gastroesophageal reflux disease)   Depression   COPD (chronic obstructive pulmonary disease) (HCC)     Acute hypoxemic respiratory failure due to SARS-CoV-2 disease: Patient with history of COPD and asthma, current smoker presents as a transfer from [place] where she presented with progressive shortness of breath, found to be in respiratory failure with hypoxia. Date of Dx: 06/16/2019 Oxygen requirements: 2 LPM Antibiotics: Received Rocephin and azithromycin at OSH, will hold off peds, follow-up procalcitonin Diuretics: Appears euvolemic Vitamin C and Zinc: Per protocol Remdesivir: Started on  Steroids: Started on 06/16/19 Actemra:  Administered 1/5 Convalescent Plasma: Not given yet DVT Prophylaxis:   Lovenox    Morbid obesity:Obesity affects all facets of care.  Likely secondary to excessive caloric intake.  Encourage patient to reduce caloric intake while increasing physical activity and engage in other lifestyle changes that may result in weight loss.     Hypertension: Blood pressure is close to goal.  Continue lisinopril hydrochlorothiazide and titrate as necessary    DM: Diabetic diet  fingersticks before meals and at bedtime.  Sliding scale insulin    Depression: Denies any suicidal homicidal ideations.  Continue Lexapro    COPD: Encourage patient to quit.  Acute exacerbation at this time.  Continue Breo Ellipta: Manage Covid as above.    DVT Prophylaxis Heparin -  Lovenox - SCDs   AM Labs Ordered, also please review Full Orders  Family Communication: Admission, patients condition and plan of care including tests being ordered have been discussed with the patient who indicate understanding and agree with the plan and Code Status.  Code Status full code  Likely DC to home  Condition GUARDED    Consults called: None  Admission status: Admit to inpatient  Time spent in minutes : 24   Coletta Memos M.D on 06/19/2019 at 1:18 AM  To page go to [email or web address] - password [facility]

## 2019-06-19 NOTE — Progress Notes (Signed)
PROGRESS NOTE  Brief Narrative: Marie Chen is a 59 y.o. female with a history of IDT2DM, COPD, asthma, OSA on BiPAP, CAD, HLD, hypothyroidism, and morbid obesity who presented to [facility] 1/5 with dyspnea found to have hypoxia due to covid-19 pneumonia. Initially requiring 15L O2, with treatments including remdesivir, decadron, ceftriaxone, azithromycin, and a dose of tocilizumab, oxygen requirements have improved. She was transferred to [facility] with plans to complete 5 day course of remdesivir and is currently on 2L O2, admitted this morning by Dr. Dwana Curd.  Subjective: Feels much better, less short of breath than arrival, wondering when she might go home. Has been getting OOB intermittently.  Objective: BP 95/72 (BP Location: Left Arm)   Pulse 72   Temp 97.9 F (36.6 C) (Oral)   Resp 18   Ht 5\' 7"  (1.702 m)   Wt (!) 151 kg   SpO2 91%   BMI 52.14 kg/m   Gen: Obese pleasant female in no distress Pulm: Nonlabored, distant. No wheezes. CV: RRR, no murmur, no JVD, no edema GI: Soft, NT, ND, +BS  Neuro: Alert and oriented. No focal deficits. Skin: No rashes, lesions or ulcers on visualized skin  Assessment & Plan: Acute hypoxic respiratory failure due to covid-19 pneumonia: +SARS-CoV-2 test 1/5. Note CTA chest at [facility] reported no PE.  - PCT undetectable,  will DC abx with clinical improvement. - Continue remdesivir 1/5 - 1/9 - s/p tocilizumab 1/5. LFTs wnl.  - Continue decadron. Inflammatory markers improving, CRP, ferritin, d-dimer are wnl. - Continue airborne, contact precautions.  - Monitor blood counts, LFTs while on remdesivir. - Continue weight-based lovenox dosing - Vitamin C, zinc, IS, FV, OOB - Wean oxygen as tolerated, PT/OT and TOC team consulted with possible DC date 1/9.  IDT2DM: Uncontrolled with hyperglycemia exacerbated by steroids. HbA1c 10.5% - Continue modifying insulins based on CBGs.   COPD, asthma:  - Continue home BDs, singulair  HTN:  - Continue ACEi,  HCTZ  Hypothyroidism:  - Continue synthroid   Depression:  - Continue SSRI  Morbid obesity: BMI 52.  - Recommend therapeutic lifestyle changes.   3/9, MD Pager on amion 06/19/2019, 1:40 PM

## 2019-06-20 DIAGNOSIS — I10 Essential (primary) hypertension: Secondary | ICD-10-CM

## 2019-06-20 DIAGNOSIS — J410 Simple chronic bronchitis: Secondary | ICD-10-CM

## 2019-06-20 DIAGNOSIS — F329 Major depressive disorder, single episode, unspecified: Secondary | ICD-10-CM

## 2019-06-20 LAB — GLUCOSE, CAPILLARY
Glucose-Capillary: 229 mg/dL — ABNORMAL HIGH (ref 70–99)
Glucose-Capillary: 289 mg/dL — ABNORMAL HIGH (ref 70–99)

## 2019-06-20 LAB — COMPREHENSIVE METABOLIC PANEL
ALT: 18 U/L (ref 0–44)
AST: 23 U/L (ref 15–41)
Albumin: 3.2 g/dL — ABNORMAL LOW (ref 3.5–5.0)
Alkaline Phosphatase: 92 U/L (ref 38–126)
Anion gap: 11 (ref 5–15)
BUN: 24 mg/dL — ABNORMAL HIGH (ref 6–20)
CO2: 27 mmol/L (ref 22–32)
Calcium: 9.2 mg/dL (ref 8.9–10.3)
Chloride: 97 mmol/L — ABNORMAL LOW (ref 98–111)
Creatinine, Ser: 0.83 mg/dL (ref 0.44–1.00)
GFR calc Af Amer: >60 mL/min (ref >60)
GFR calc non Af Amer: >60 mL/min (ref >60)
Glucose, Bld: 277 mg/dL — ABNORMAL HIGH (ref 70–99)
Potassium: 4.2 mmol/L (ref 3.5–5.1)
Sodium: 135 mmol/L (ref 135–145)
Total Bilirubin: 1 mg/dL (ref 0.3–1.2)
Total Protein: 6.8 g/dL (ref 6.5–8.1)

## 2019-06-20 LAB — CBC WITH DIFFERENTIAL/PLATELET
Abs Immature Granulocytes: 0.06 10*3/uL (ref 0.00–0.07)
Basophils Absolute: 0 10*3/uL (ref 0.0–0.1)
Basophils Relative: 0 %
Eosinophils Absolute: 0 10*3/uL (ref 0.0–0.5)
Eosinophils Relative: 0 %
HCT: 37.9 % (ref 36.0–46.0)
Hemoglobin: 11.2 g/dL — ABNORMAL LOW (ref 12.0–15.0)
Immature Granulocytes: 1 %
Lymphocytes Relative: 20 %
Lymphs Abs: 1.7 10*3/uL (ref 0.7–4.0)
MCH: 22.6 pg — ABNORMAL LOW (ref 26.0–34.0)
MCHC: 29.6 g/dL — ABNORMAL LOW (ref 30.0–36.0)
MCV: 76.4 fL — ABNORMAL LOW (ref 80.0–100.0)
Monocytes Absolute: 0.8 10*3/uL (ref 0.1–1.0)
Monocytes Relative: 9 %
Neutro Abs: 6 10*3/uL (ref 1.7–7.7)
Neutrophils Relative %: 70 %
Platelets: 315 10*3/uL (ref 150–400)
RBC: 4.96 MIL/uL (ref 3.87–5.11)
RDW: 17.7 % — ABNORMAL HIGH (ref 11.5–15.5)
WBC: 8.6 10*3/uL (ref 4.0–10.5)
nRBC: 0 % (ref 0.0–0.2)

## 2019-06-20 MED ORDER — DEXAMETHASONE 4 MG PO TABS: 4.0000 mg | ORAL_TABLET | Freq: Every day | ORAL | 0 refills | Status: DC

## 2019-06-20 NOTE — Plan of Care (Signed)
  Problem: Education: Goal: Knowledge of risk factors and measures for prevention of condition will improve Outcome: Progressing   Problem: Coping: Goal: Psychosocial and spiritual needs will be supported Outcome: Progressing   Problem: Respiratory: Goal: Will maintain a patent airway Outcome: Progressing Goal: Complications related to the disease process, condition or treatment will be avoided or minimized Outcome: Progressing   

## 2019-06-20 NOTE — Progress Notes (Signed)
Walk test results:  O2 at rest on room air: 95 % O2 while ambulating room air: 96%

## 2019-06-20 NOTE — Progress Notes (Signed)
Patient discharged. Patient received and understood all discharge instructions and education/follow up appointments. Patient's two IVs deaccessed and all belongings accounted for. Patient's son is on his way to pick up patient.

## 2019-06-20 NOTE — Discharge Summary (Signed)
Physician Discharge Summary  Marie Chen PNT:[id number] DOB: 1960/12/07 DOA: 06/18/2019  PCP: [facility]  Admit date: 06/18/2019 Discharge date: 06/20/2019  Admitted From: Home by way of [facility] Disposition: Home   Recommendations for Outpatient Follow-up:  1. Follow up with PCP in 1-2 weeks 2. Please obtain CMP/CBC in one week  Home Health: None Equipment/Devices: None Discharge Condition: Stable CODE STATUS: Full Diet recommendation: Heart healthy, carb-modified  Brief/Interim Summary: Marie Chen is a 59 y.o. female with a history of IDT2DM, COPD, asthma, OSA on BiPAP, CAD, HLD, hypothyroidism, and morbid obesity who presented to [facility] 1/5 with dyspnea found to have hypoxia due to covid-19 pneumonia. Initially requiring 15L O2, with treatments including remdesivir, decadron, ceftriaxone, azithromycin, and a dose of tocilizumab, oxygen requirements have improved. She was transferred to [facility] requiring 2L O2, weaned to room air on 1/8. CRP and PCT are normal/undetectable. She completed a 5 day course of remdesivir on 1/9 and is no longer hypoxic on exertion, felt to be stable for discharge.   Discharge Diagnoses:  Principal Problem:   Acute hypoxemic respiratory failure due to severe acute respiratory syndrome coronavirus 2 (SARS-CoV-2) disease (HCC) Active Problems:   Morbid obesity (HCC)   Hypertension   GERD (gastroesophageal reflux disease)   Depression   COPD (chronic obstructive pulmonary disease) (HCC)  Acute hypoxic respiratory failure due to covid-19 pneumonia: +SARS-CoV-2 test 1/5. Note CTA chest at [facility] reported no PE.  - PCT undetectable, stopped abx with clinical improvement. - Completed remdesivir 1/5 - 1/9 - s/p tocilizumab 1/5. LFTs wnl.  - Continue decadron for 5 more days at low dose to avoid severe hyperglycemia. Inflammatory markers improving, CRP, ferritin, d-dimer are wnl. - Continue isolation x21 days from day of positive test.  IDT2DM:  Uncontrolled with hyperglycemia exacerbated by steroids. HbA1c 10.5% - Continue home insulin including sliding scale which may need higher doses both acutely and chronically. D/w pt, will need PCP input within 1 week via telehealth. Pt to arrange quick follow up.  COPD, asthma:  - Continue home BDs, singulair  HTN:  - Continue ACEi, HCTZ  Hypothyroidism:  - Continue synthroid   Depression:  - Continue SSRI  Morbid obesity: BMI 52.  - Recommend therapeutic lifestyle changes.   Discharge Instructions Discharge Instructions    Diet - low sodium heart healthy   Complete by: As directed    Diet Carb Modified   Complete by: As directed    Discharge instructions   Complete by: As directed    You are being discharged from the hospital after treatment for covid-19 infection. You are felt to be stable enough to no longer require inpatient monitoring, testing, and treatment, though you will need to follow the recommendations below: - Continue taking decadron for 5 more days. This will drive blood sugars up, so you will need to continue taking insulin including sliding scale insulin. If your blood sugar is above 400mg , contact your doctor.  - Remain in self-isolation for 21 days following your first positive covid test reduce risk of transmission of the virus.  - Do not take NSAID medications (including, but not limited to, ibuprofen, advil, motrin, naproxen, aleve, goody's powder, etc.) - Follow up with your doctor in the next week via telehealth or seek medical attention right away if your symptoms get WORSE.  - Consider donating plasma after you have recovered (either 14 days after a negative test or 28 days after symptoms have completely resolved) because your antibodies to this virus  may be helpful to give to others with life-threatening infections. Please go to the website [email or web address] if you would like to consider volunteering for plasma donation.    Directions for you at  home:  Wear a facemask You should wear a facemask that covers your nose and mouth when you are in the same room with other people and when you visit a healthcare provider. People who live with or visit you should also wear a facemask while they are in the same room with you.  Separate yourself from other people in your home As much as possible, you should stay in a different room from other people in your home. Also, you should use a separate bathroom, if available.  Avoid sharing household items You should not share dishes, drinking glasses, cups, eating utensils, towels, bedding, or other items with other people in your home. After using these items, you should wash them thoroughly with soap and water.  Cover your coughs and sneezes Cover your mouth and nose with a tissue when you cough or sneeze, or you can cough or sneeze into your sleeve. Throw used tissues in a lined trash can, and immediately wash your hands with soap and water for at least 20 seconds or use an alcohol-based hand rub.  Wash your [employer] Wash your hands often and thoroughly with soap and water for at least 20 seconds. You can use an alcohol-based hand sanitizer if soap and water are not available and if your hands are not visibly dirty. Avoid touching your eyes, nose, and mouth with unwashed hands.  Directions for those who live with, or provide care at home for you:  Limit the number of people who have contact with the patient If possible, have only one caregiver for the patient. Other household members should stay in another home or place of residence. If this is not possible, they should stay in another room, or be separated from the patient as much as possible. Use a separate bathroom, if available. Restrict visitors who do not have an essential need to be in the home.  Ensure good ventilation Make sure that shared spaces in the home have good air flow, such as from an air conditioner or an opened  window, weather permitting.  Wash your hands often Wash your hands often and thoroughly with soap and water for at least 20 seconds. You can use an alcohol based hand sanitizer if soap and water are not available and if your hands are not visibly dirty. Avoid touching your eyes, nose, and mouth with unwashed hands. Use disposable paper towels to dry your hands. If not available, use dedicated cloth towels and replace them when they become wet.  Wear a facemask and gloves Wear a disposable facemask at all times in the room and gloves when you touch or have contact with the patient's blood, body fluids, and/or secretions or excretions, such as sweat, saliva, sputum, nasal mucus, vomit, urine, or feces.  Ensure the mask fits over your nose and mouth tightly, and do not touch it during use. Throw out disposable facemasks and gloves after using them. Do not reuse. Wash your hands immediately after removing your facemask and gloves. If your personal clothing becomes contaminated, carefully remove clothing and launder. Wash your hands after handling contaminated clothing. Place all used disposable facemasks, gloves, and other waste in a lined container before disposing them with other household waste. Remove gloves and wash your hands immediately after handling these items.  Do not share dishes,  glasses, or other household items with the patient Avoid sharing household items. You should not share dishes, drinking glasses, cups, eating utensils, towels, bedding, or other items with a patient who is confirmed to have, or being evaluated for, COVID-19 infection. After the person uses these items, you should wash them thoroughly with soap and water.  Wash laundry thoroughly Immediately remove and wash clothes or bedding that have blood, body fluids, and/or secretions or excretions, such as sweat, saliva, sputum, nasal mucus, vomit, urine, or feces, on them. Wear gloves when handling laundry from the  patient. Read and follow directions on labels of laundry or clothing items and detergent. In general, wash and dry with the warmest temperatures recommended on the label.  Clean all areas the individual has used often Clean all touchable surfaces, such as counters, tabletops, doorknobs, bathroom fixtures, toilets, phones, keyboards, tablets, and bedside tables, every day. Also, clean any surfaces that may have blood, body fluids, and/or secretions or excretions on them. Wear gloves when cleaning surfaces the patient has come in contact with. Use a diluted bleach solution (e.g., dilute bleach with 1 part bleach and 10 parts water) or a household disinfectant with a label that says EPA-registered for coronaviruses. To make a bleach solution at home, add 1 tablespoon of bleach to 1 quart (4 cups) of water. For a larger supply, add  cup of bleach to 1 gallon (16 cups) of water. Read labels of cleaning products and follow recommendations provided on product labels. Labels contain instructions for safe and effective use of the cleaning product including precautions you should take when applying the product, such as wearing gloves or eye protection and making sure you have good ventilation during use of the product. Remove gloves and wash hands immediately after cleaning.  Monitor yourself for signs and symptoms of illness Caregivers and household members are considered close contacts, should monitor their health, and will be asked to limit movement outside of the home to the extent possible. Follow the monitoring steps for close contacts listed on the symptom monitoring form.  If you have additional questions, contact your local health department or call the epidemiologist on call at [phone] (available 24/7). This guidance is subject to change. For the most up-to-date guidance from [facility], please refer to their website: [email or web address]    Increase activity slowly   Complete by: As directed      Allergies as of 06/20/2019      Reactions   Macrodantin [nitrofurantoin Macrocrystal] Rash   Metformin And Related Other (See Comments)   Causes yeast infections   Hydromorphone Hcl Nausea And Vomiting   Lipitor [atorvastatin Calcium] Diarrhea   Meperidine Hcl Nausea Only   Is ok if given with Phenergan   Tetracyclines & Related Rash      Medication List    STOP taking these medications   glycopyrrolate 2 MG tablet Commonly known as: ROBINUL     TAKE these medications   aspirin EC 81 MG tablet Take 1 tablet (81 mg total) by mouth daily.   atorvastatin 40 MG tablet Commonly known as: LIPITOR   Breo Ellipta 100-25 MCG/INH Aepb Generic drug: fluticasone furoate-vilanterol Inhale 1 puff into the lungs daily.   calcium carbonate 500 MG chewable tablet Commonly known as: TUMS - dosed in mg elemental calcium Chew 2 tablets by mouth daily as needed for indigestion or heartburn.   cyclobenzaprine 10 MG tablet Commonly known as: FLEXERIL Take 10 mg by mouth every 8 (eight) hours as needed for muscle  spasms.   dexamethasone 4 MG tablet Commonly known as: DECADRON Take 1 tablet (4 mg total) by mouth daily.   escitalopram 20 MG tablet Commonly known as: LEXAPRO Take 20 mg by mouth at bedtime.   ezetimibe 10 MG tablet Commonly known as: ZETIA TAKE 1 TABLET(10 MG) BY MOUTH DAILY What changed:   how much to take  how to take this  when to take this   fluticasone 50 MCG/ACT nasal spray Commonly known as: FLONASE Place 1 spray into both nostrils daily as needed (seasonal allergies).   HYDROcodone-acetaminophen 10-325 MG tablet Commonly known as: NORCO Take 1 tablet by mouth 3 (three) times daily as needed (pain).   insulin glargine 100 unit/mL Sopn Commonly known as: LANTUS Inject 85 Units into the skin daily before breakfast.   ipratropium-albuterol 0.5-2.5 (3) MG/3ML Soln Commonly known as: DUONEB Take 3  mLs by nebulization every 6 (six) hours as needed (shortness of breath/wheezing).   levothyroxine 112 MCG tablet Commonly known as: SYNTHROID Take 112 mcg by mouth daily before breakfast.   lisinopril-hydrochlorothiazide 20-25 MG tablet Commonly known as: ZESTORETIC Take 1 tablet by mouth daily with breakfast.   LUBRICATING EYE DROPS OP Place 1 drop into both eyes daily as needed (dry eyes).   Magnesium 400 MG Tabs Take 400 mg by mouth every other day.   montelukast 10 MG tablet Commonly known as: SINGULAIR Take 10 mg by mouth daily with breakfast.   naproxen sodium 220 MG tablet Commonly known as: ALEVE Take 440 mg by mouth daily as needed (pain).   nitroGLYCERIN 0.4 MG SL tablet Commonly known as: NITROSTAT Place 1 tablet (0.4 mg total) under the tongue every 5 (five) minutes as needed. What changed: reasons to take this   NovoLOG FlexPen 100 UNIT/ML FlexPen Generic drug: insulin aspart Inject 5-10 Units into the skin 3 (three) times daily with meals. Per sliding scale   OVER THE COUNTER MEDICATION Take 300 mg by mouth at bedtime as needed (sleep). CBD oil   pregabalin 100 MG capsule Commonly known as: LYRICA Take 100 mg by mouth 3 (three) times daily.   terconazole 0.4 % vaginal cream Commonly known as: TERAZOL 7 Place 1 applicator vaginally at bedtime as needed (yeast infections).   topiramate 25 MG tablet Commonly known as: TOPAMAX Take 25 mg by mouth at bedtime.   Vitamin D (Ergocalciferol) 1.25 MG (50000 UT) Caps capsule Commonly known as: DRISDOL Take 50,000 Units by mouth every Saturday.      Follow-up Information    [facility]. Schedule an appointment as soon as possible for a visit in 1 week(s).   Specialty: Family Medicine Contact information: [address] [place] [address] [phone]          Allergies  Allergen Reactions  . Macrodantin [Nitrofurantoin Macrocrystal] Rash  . Metformin And Related Other (See  Comments)    Causes yeast infections  . Hydromorphone Hcl Nausea And Vomiting  . Lipitor [Atorvastatin Calcium] Diarrhea  . Meperidine Hcl Nausea Only    Is ok if given with Phenergan  . Tetracyclines & Related Rash    Consultations:  None  Procedures/Studies:  None found  Subjective: Feels well, no dyspnea or other complaints. First words are "do I get to go home today?"  Discharge Exam: Vitals:   06/20/19 0434 06/20/19 0723  BP:  102/65  Pulse:  69  Resp:  16  Temp: 97.6 F (36.4 C) (!) 97.4 F (36.3 C)  SpO2:  95%  General: Pt is alert, awake, not in acute distress Cardiovascular: RRR, S1/S2 +, no rubs, no gallops Respiratory: CTA bilaterally, no wheezing, no rhonchi Abdominal: Soft, NT, ND, bowel sounds + Extremities: No pitting edema, no cyanosis  Labs: BNP (last 3 results) Recent Labs    06/19/19 0215  BNP 24.2   Basic Metabolic Panel: Recent Labs  Lab 06/19/19 0215 06/20/19 0033  NA 133* 135  K 3.7 4.2  CL 96* 97*  CO2 24 27  GLUCOSE 331* 277*  BUN 24* 24*  CREATININE 0.66 0.83  CALCIUM 8.8* 9.2   Liver Function Tests: Recent Labs  Lab 06/19/19 0215 06/20/19 0033  AST 17 23  ALT 16 18  ALKPHOS 86 92  BILITOT 0.8 1.0  PROT 6.4* 6.8  ALBUMIN 2.9* 3.2*   CBC: Recent Labs  Lab 06/19/19 0215 06/20/19 0033  WBC 8.4 8.6  NEUTROABS 6.4 6.0  HGB 10.8* 11.2*  HCT 35.8* 37.9  MCV 76.2* 76.4*  PLT 280 315   CBG: Recent Labs  Lab 06/19/19 1134 06/19/19 1517 06/19/19 2015 06/19/19 2346 06/20/19 0725  GLUCAP 296* 340* 411* 289* 229*   D-Dimer Recent Labs    06/19/19 0215  DDIMER 0.42   Hgb A1c Recent Labs    06/19/19 0215  HGBA1C 10.5*    Time coordinating discharge: Approximately 40 minutes  Patrecia Pour, MD  [facility] 06/20/2019, 5:12 PM

## 2019-06-20 NOTE — Discharge Instructions (Signed)
Person Under Monitoring Name: Marie Chen  Location: [address] [place] [address]   Infection Prevention Recommendations for Individuals Confirmed to have, or Being Evaluated for, 2019 Novel Coronavirus (COVID-19) Infection Who Receive Care at Home  Individuals who are confirmed to have, or are being evaluated for, COVID-19 should follow the prevention steps below until a healthcare provider or local or state health department says they can return to normal activities.  Stay home except to get medical care You should restrict activities outside your home, except for getting medical care. Do not go to work, school, or public areas, and do not use public transportation or taxis.  Call ahead before visiting your doctor Before your medical appointment, call the healthcare provider and tell them that you have, or are being evaluated for, COVID-19 infection. This will help the healthcare provider's office take steps to keep other people from getting infected. Ask your healthcare provider to call the local or state health department.  Monitor your symptoms Seek prompt medical attention if your illness is worsening (e.g., difficulty breathing). Before going to your medical appointment, call the healthcare provider and tell them that you have, or are being evaluated for, COVID-19 infection. Ask your healthcare provider to call the local or state health department.  Wear a facemask You should wear a facemask that covers your nose and mouth when you are in the same room with other people and when you visit a healthcare provider. People who live with or visit you should also wear a facemask while they are in the same room with you.  Separate yourself from other people in your home As much as possible, you should stay in a different room from other people in your home. Also, you should use a separate bathroom, if available.  Avoid sharing household items You should not  share dishes, drinking glasses, cups, eating utensils, towels, bedding, or other items with other people in your home. After using these items, you should wash them thoroughly with soap and water.  Cover your coughs and sneezes Cover your mouth and nose with a tissue when you cough or sneeze, or you can cough or sneeze into your sleeve. Throw used tissues in a lined trash can, and immediately wash your hands with soap and water for at least 20 seconds or use an alcohol-based hand rub.  Wash your [employer] your hands often and thoroughly with soap and water for at least 20 seconds. You can use an alcohol-based hand sanitizer if soap and water are not available and if your hands are not visibly dirty. Avoid touching your eyes, nose, and mouth with unwashed hands.   Prevention Steps for Caregivers and Household Members of Individuals Confirmed to have, or Being Evaluated for, COVID-19 Infection Being Cared for in the Home  If you live with, or provide care at home for, a person confirmed to have, or being evaluated for, COVID-19 infection please follow these guidelines to prevent infection:  Follow healthcare provider's instructions Make sure that you understand and can help the patient follow any healthcare provider instructions for all care.  Provide for the patient's basic needs You should help the patient with basic needs in the home and provide support for getting groceries, prescriptions, and other personal needs.  Monitor the patient's symptoms If they are getting sicker, call his or her medical provider and tell them that the patient has, or is being evaluated for, COVID-19 infection. This will help  the healthcare provider's office take steps to keep other people from getting infected. Ask the healthcare provider to call the local or state health department.  Limit the number of people who have contact with the patient  If possible, have only one caregiver for the  patient.  Other household members should stay in another home or place of residence. If this is not possible, they should stay  in another room, or be separated from the patient as much as possible. Use a separate bathroom, if available.  Restrict visitors who do not have an essential need to be in the home.  Keep older adults, very young children, and other sick people away from the patient Keep older adults, very young children, and those who have compromised immune systems or chronic health conditions away from the patient. This includes people with chronic heart, lung, or kidney conditions, diabetes, and cancer.  Ensure good ventilation Make sure that shared spaces in the home have good air flow, such as from an air conditioner or an opened window, weather permitting.  Wash your hands often  Wash your hands often and thoroughly with soap and water for at least 20 seconds. You can use an alcohol based hand sanitizer if soap and water are not available and if your hands are not visibly dirty.  Avoid touching your eyes, nose, and mouth with unwashed hands.  Use disposable paper towels to dry your hands. If not available, use dedicated cloth towels and replace them when they become wet.  Wear a facemask and gloves  Wear a disposable facemask at all times in the room and gloves when you touch or have contact with the patient's blood, body fluids, and/or secretions or excretions, such as sweat, saliva, sputum, nasal mucus, vomit, urine, or feces.  Ensure the mask fits over your nose and mouth tightly, and do not touch it during use.  Throw out disposable facemasks and gloves after using them. Do not reuse.  Wash your hands immediately after removing your facemask and gloves.  If your personal clothing becomes contaminated, carefully remove clothing and launder. Wash your hands after handling contaminated clothing.  Place all used disposable facemasks, gloves, and other waste in a lined  container before disposing them with other household waste.  Remove gloves and wash your hands immediately after handling these items.  Do not share dishes, glasses, or other household items with the patient  Avoid sharing household items. You should not share dishes, drinking glasses, cups, eating utensils, towels, bedding, or other items with a patient who is confirmed to have, or being evaluated for, COVID-19 infection.  After the person uses these items, you should wash them thoroughly with soap and water.  Wash laundry thoroughly  Immediately remove and wash clothes or bedding that have blood, body fluids, and/or secretions or excretions, such as sweat, saliva, sputum, nasal mucus, vomit, urine, or feces, on them.  Wear gloves when handling laundry from the patient.  Read and follow directions on labels of laundry or clothing items and detergent. In general, wash and dry with the warmest temperatures recommended on the label.  Clean all areas the individual has used often  Clean all touchable surfaces, such as counters, tabletops, doorknobs, bathroom fixtures, toilets, phones, keyboards, tablets, and bedside tables, every day. Also, clean any surfaces that may have blood, body fluids, and/or secretions or excretions on them.  Wear gloves when cleaning surfaces the patient has come in contact with.  Use a diluted bleach solution (e.g., dilute  bleach with 1 part bleach and 10 parts water) or a household disinfectant with a label that says EPA-registered for coronaviruses. To make a bleach solution at home, add 1 tablespoon of bleach to 1 quart (4 cups) of water. For a larger supply, add  cup of bleach to 1 gallon (16 cups) of water.  Read labels of cleaning products and follow recommendations provided on product labels. Labels contain instructions for safe and effective use of the cleaning product including precautions you should take when applying the product, such as wearing gloves or  eye protection and making sure you have good ventilation during use of the product.  Remove gloves and wash hands immediately after cleaning.  Monitor yourself for signs and symptoms of illness Caregivers and household members are considered close contacts, should monitor their health, and will be asked to limit movement outside of the home to the extent possible. Follow the monitoring steps for close contacts listed on the symptom monitoring form.   ? If you have additional questions, contact your local health department or call the epidemiologist on call at [phone] (available 24/7). ? This guidance is subject to change. For the most up-to-date guidance from [facility], please refer to their website: [email or web address]

## 2019-07-06 ENCOUNTER — Other Ambulatory Visit: Payer: Self-pay

## 2019-07-29 ENCOUNTER — Other Ambulatory Visit: Payer: Self-pay | Admitting: Cardiology

## 2019-09-23 HISTORY — DX: Morbid (severe) obesity due to excess calories: E66.01

## 2019-10-02 ENCOUNTER — Other Ambulatory Visit: Payer: Self-pay | Admitting: Orthopedic Surgery

## 2019-10-06 ENCOUNTER — Other Ambulatory Visit: Payer: Self-pay | Admitting: Orthopedic Surgery

## 2019-10-06 DIAGNOSIS — M2352 Chronic instability of knee, left knee: Secondary | ICD-10-CM

## 2019-10-15 ENCOUNTER — Other Ambulatory Visit: Payer: Self-pay

## 2019-10-19 ENCOUNTER — Encounter: Payer: Self-pay | Admitting: Internal Medicine

## 2019-10-19 ENCOUNTER — Ambulatory Visit: Payer: No Typology Code available for payment source | Admitting: Internal Medicine

## 2019-10-19 ENCOUNTER — Other Ambulatory Visit: Payer: Self-pay

## 2019-10-19 VITALS — BP 148/78 | HR 96 | Temp 99.6°F | Ht 67.0 in | Wt 329.8 lb

## 2019-10-19 DIAGNOSIS — E785 Hyperlipidemia, unspecified: Secondary | ICD-10-CM

## 2019-10-19 DIAGNOSIS — E1169 Type 2 diabetes mellitus with other specified complication: Secondary | ICD-10-CM | POA: Diagnosis not present

## 2019-10-19 DIAGNOSIS — E669 Obesity, unspecified: Secondary | ICD-10-CM | POA: Diagnosis not present

## 2019-10-19 DIAGNOSIS — E1165 Type 2 diabetes mellitus with hyperglycemia: Secondary | ICD-10-CM

## 2019-10-19 HISTORY — DX: Type 2 diabetes mellitus with hyperglycemia: E11.65

## 2019-10-19 HISTORY — DX: Hyperlipidemia, unspecified: E78.5

## 2019-10-19 LAB — POCT GLYCOSYLATED HEMOGLOBIN (HGB A1C): Hemoglobin A1C: 9 % — AB (ref 4.0–5.6)

## 2019-10-19 LAB — GLUCOSE, POCT (MANUAL RESULT ENTRY): POC Glucose: 195 mg/dl — AB (ref 70–99)

## 2019-10-19 MED ORDER — NOVOLOG FLEXPEN 100 UNIT/ML ~~LOC~~ SOPN: 15.0000 [IU] | PEN_INJECTOR | Freq: Three times a day (TID) | SUBCUTANEOUS | 4 refills | Status: DC

## 2019-10-19 NOTE — Patient Instructions (Addendum)
-   Decrease Lantus to 80 units DAILY  - Novolog 15 units with EACH meal  - Novolog correctional insulin: ADD extra units on insulin to your meal-time Novolog dose if your blood sugars are higher than . Use the scale below to help guide you:   Blood sugar before meal Number of units to inject  Less than 155 0 unit  156 -  180 1 units  181 -  205 2 units  206 -  230 3 units  231-  255 4 units  256 -  280 5 units  281 -  305 6 units  306 -  330 7 units  331-  355 8 units         Choose healthy, lower carb lower calorie snacks: toss salad, cooked vegetables, cottage cheese, peanut butter, low fat cheese / string cheese, lower sodium deli meat, tuna salad or chicken salad     HOW TO TREAT LOW BLOOD SUGARS (Blood sugar LESS THAN 70 MG/DL)  Please follow the RULE OF 15 for the treatment of hypoglycemia treatment (when your (blood sugars are less than 70 mg/dL)    STEP 1: Take 15 grams of carbohydrates when your blood sugar is low, which includes:   3-4 GLUCOSE TABS  OR  3-4 OZ OF JUICE OR REGULAR SODA OR  ONE TUBE OF GLUCOSE GEL     STEP 2: RECHECK blood sugar in 15 MINUTES STEP 3: If your blood sugar is still low at the 15 minute recheck --> then, go back to STEP 1 and treat AGAIN with another 15 grams of carbohydrates.     PUMP Options : T-slim (Tandem) and Medtronic - please look up on you tube and online , and will discus on next visit

## 2019-10-19 NOTE — Progress Notes (Signed)
Name: Marie Chen  MRN/ DOB: [id number], 05-07-1961   Age/ Sex: 59 y.o., female    [facility]   Reason for Endocrinology Evaluation: Type 2 Diabetes Mellitus     Date of Initial Endocrinology Visit: 10/19/2019     PATIENT IDENTIFIER: Marie Chen is a 59 y.o. female with a past medical history of DM, COPD, OSA on BiPAP, CAD and Dyslipidemia. The patient presented for initial endocrinology clinic visit on 10/19/2019 for consultative assistance with her diabetes management.    HPI: Marie Chen was    Diagnosed with T2DM  2015 Prior Medications tried/Intolerance: Metformin- genital skin irritation  Currently checking blood sugars 2 x / day  Hypoglycemia episodes : no            Hemoglobin A1c has ranged from 8.8% in 03/2019 , peaking at 10.5% in 2021. Patient required assistance for hypoglycemia: no  Patient has required hospitalization within the last 1 year from hyper or hypoglycemia: No . But had an admission for COVID-19 infection 06/2019  In terms of diet, the patient eats 2 meals a day, she is a grazer. She drinks sweet tea but drinks diet sodas.    HOME DIABETES REGIMEN: Lantus 105 units daily  Novolog SS ( < 150 2 units , 150-200 4 units, 201-250 6 units )    Statin: Yes ACE-I/ARB: Yes Prior Diabetic Education:Yes    METER DOWNLOAD SUMMARY: Did not bring     DIABETIC COMPLICATIONS: Microvascular complications:   Neuropathy   Denies: CKD, retinopathy   Last eye exam: Completed 2019  Macrovascular complications:   CAD ( medically treated per pt)   Denies: CAD, PVD, CVA   PAST HISTORY: Past Medical History:  Past Medical History:  Diagnosis Date  . Asthma   . COPD (chronic obstructive pulmonary disease) (HCC)   . Depression   . Diabetes mellitus without complication (HCC)   . GERD (gastroesophageal reflux disease)   . Hypertension   . Migraine   . Sjogren's disease (HCC)   . Sleep apnea    bipap  . Thyroid  disease    Past Surgical History:  Past Surgical History:  Procedure Laterality Date  . ANKLE SURGERY    . CARPAL TUNNEL RELEASE    . CHOLECYSTECTOMY    . HYSTEROSCOPY    . LEFT HEART CATH AND CORONARY ANGIOGRAPHY N/A 12/19/2018   Procedure: LEFT HEART CATH AND CORONARY ANGIOGRAPHY;  Surgeon: Yvonne Kendall, MD;  Location: [facility];  Service: Cardiovascular;  Laterality: N/A;      Social History:  reports that she has been smoking cigarettes. She has never used smokeless tobacco. She reports that she does not drink alcohol or use drugs. Family History:  Family History  Problem Relation Age of Onset  . Heart failure Father   . Colon cancer Neg Hx      HOME MEDICATIONS: Allergies as of 10/19/2019      Reactions   Macrodantin [nitrofurantoin Macrocrystal] Rash   Metformin And Related Other (See Comments)   Causes yeast infections   Hydromorphone Hcl Nausea And Vomiting   Lipitor [atorvastatin Calcium] Diarrhea   Meperidine Hcl Nausea Only   Is ok if given with Phenergan   Tetracyclines & Related Rash      Medication List       Accurate as of Oct 19, 2019 11:30 AM. If you have any questions, ask your nurse or doctor.        Accu-Chek Aviva  Plus test strip Generic drug: glucose blood   albuterol (2.5 MG/3ML) 0.083% nebulizer solution Commonly known as: PROVENTIL   aspirin EC 81 MG tablet Take 1 tablet (81 mg total) by mouth daily.   atorvastatin 40 MG tablet Commonly known as: LIPITOR   Breo Ellipta 100-25 MCG/INH Aepb Generic drug: fluticasone furoate-vilanterol Inhale 1 puff into the lungs daily.   calcium carbonate 500 MG chewable tablet Commonly known as: TUMS - dosed in mg elemental calcium Chew 2 tablets by mouth daily as needed for indigestion or heartburn.   cyclobenzaprine 10 MG tablet Commonly known as: FLEXERIL Take 10 mg by mouth every 8 (eight) hours as needed for muscle spasms.   dexamethasone 4 MG tablet Commonly known as:  DECADRON Take 1 tablet (4 mg total) by mouth daily.   escitalopram 20 MG tablet Commonly known as: LEXAPRO Take 20 mg by mouth at bedtime.   ezetimibe 10 MG tablet Commonly known as: ZETIA TAKE 1 TABLET  (10 MG) BY MOUTH DAILY   fluticasone 50 MCG/ACT nasal spray Commonly known as: FLONASE Place 1 spray into both nostrils daily as needed (seasonal allergies).   HYDROcodone-acetaminophen 10-325 MG tablet Commonly known as: NORCO Take 1 tablet by mouth 3 (three) times daily as needed (pain).   insulin glargine 100 unit/mL Sopn Commonly known as: LANTUS Inject 105 Units into the skin daily before breakfast.   ipratropium-albuterol 0.5-2.5 (3) MG/3ML Soln Commonly known as: DUONEB Take 3 mLs by nebulization every 6 (six) hours as needed (shortness of breath/wheezing).   levothyroxine 112 MCG tablet Commonly known as: SYNTHROID Take 125 mcg by mouth daily before breakfast.   lisinopril-hydrochlorothiazide 20-25 MG tablet Commonly known as: ZESTORETIC Take 1 tablet by mouth daily with breakfast.   LUBRICATING EYE DROPS OP Place 1 drop into both eyes daily as needed (dry eyes).   Magnesium 400 MG Tabs Take 400 mg by mouth every other day.   montelukast 10 MG tablet Commonly known as: SINGULAIR Take 10 mg by mouth daily with breakfast.   naproxen sodium 220 MG tablet Commonly known as: ALEVE Take 440 mg by mouth daily as needed (pain).   nitroGLYCERIN 0.4 MG SL tablet Commonly known as: NITROSTAT Place 1 tablet (0.4 mg total) under the tongue every 5 (five) minutes as needed. What changed: reasons to take this   NovoLOG FlexPen 100 UNIT/ML FlexPen Generic drug: insulin aspart Inject 5-10 Units into the skin 3 (three) times daily with meals. Per sliding scale   OVER THE COUNTER MEDICATION Take 300 mg by mouth at bedtime as needed (sleep). CBD oil   pregabalin 100 MG capsule Commonly known as: LYRICA Take 150 mg by mouth 3 (three) times daily.   terconazole 0.4  % vaginal cream Commonly known as: TERAZOL 7 Place 1 applicator vaginally at bedtime as needed (yeast infections).   topiramate 25 MG tablet Commonly known as: TOPAMAX Take 25 mg by mouth at bedtime.   Trelegy Ellipta 100-62.5-25 MCG/INH Aepb Generic drug: Fluticasone-Umeclidin-Vilant   Vitamin D (Ergocalciferol) 1.25 MG (50000 UNIT) Caps capsule Commonly known as: DRISDOL Take 50,000 Units by mouth every Saturday.        ALLERGIES: Allergies  Allergen Reactions  . Macrodantin [Nitrofurantoin Macrocrystal] Rash  . Metformin And Related Other (See Comments)    Causes yeast infections  . Hydromorphone Hcl Nausea And Vomiting  . Lipitor [Atorvastatin Calcium] Diarrhea  . Meperidine Hcl Nausea Only    Is ok if given with Phenergan  . Tetracyclines & Related Rash  REVIEW OF SYSTEMS: A comprehensive ROS was conducted with the patient and is negative except as per HPI and below:  Review of Systems  Gastrointestinal: Negative for diarrhea and nausea.  Neurological: Positive for tingling.      OBJECTIVE:   VITAL SIGNS: BP (!) 148/78 (BP Location: Right Arm, Patient Position: Sitting, Cuff Size: Large)   Pulse 96   Temp 99.6 F (37.6 C)   Ht 5\' 7"  (1.702 m)   Wt (!) 329 lb 12.8 oz (149.6 kg)   SpO2 95%   BMI 51.65 kg/m    PHYSICAL EXAM:  General: Pt appears well and is in NAD  HEENT:  Eyes: External eye exam normal without stare, lid lag or exophthalmos.  EOM intact.   Neck: General: Supple without adenopathy or carotid bruits. Thyroid: Thyroid size normal.  No goiter or nodules appreciated. No thyroid bruit.  Lungs: Clear with good BS bilat with no rales, rhonchi, or wheezes  Heart: RRR with normal S1 and S2 and no gallops; no murmurs; no rub  Abdomen: Normoactive bowel sounds, soft, nontender, without masses or organomegaly palpable  Extremities:  Lower extremities - trace pretibial edema  Skin: Normal texture and temperature to palpation.   Neuro: MS is  good with appropriate affect, pt is alert and Ox3    DM foot exam: 10/19/2019 The skin of the feet is without sores or ulcerations but has thickened toe nails and plantar formation noted B/L  The pedal pulses are 2+ on right and 2+ on left. The sensation is decreased  to a screening 5.07, 10 gram monofilament bilaterally   DATA REVIEWED:  Lab Results  Component Value Date   HGBA1C 10.5 (H) 06/19/2019   HGBA1C 6.4 03/14/2009   HGBA1C 6.4 08/31/2008   Lab Results  Component Value Date   MICROALBUR 0.7 03/14/2009   LDLCALC 123 (H) 01/07/2019   CREATININE 0.83 06/20/2019   Lab Results  Component Value Date   MICRALBCREAT 2.3 03/14/2009    Lab Results  Component Value Date   CHOL 179 01/07/2019   HDL 34 (L) 01/07/2019   LDLCALC 123 (H) 01/07/2019   LDLDIRECT 179.5 03/14/2009   TRIG 112 01/07/2019   CHOLHDL 6 03/14/2009       07/01/2019  BUN/Cr 13/0.62 GFR 98   03/25/2019 Tg 140 LDL 101 A1c 8.8 %  ASSESSMENT / PLAN / RECOMMENDATIONS:   1) Type 2 Diabetes Mellitus, Poorly controlled, With Neuropathic complications - Most recent A1c of 9.0 %. Goal A1c < 7.0 %.    - Pt is interested in an insulin pump, she is in the process of being evaluated for a weight loss surgery and I explained to her that there's a chance of diabetes going in to remission after surgery and I suggest we hold off on this for now.  - We discussed the importance of dietary discretions and avoiding sugar-sweetened beverages as well as low carb snacks. - We discussed add-on therapy if needed with GLP-1 agonist and SGLt-2 inhibitors if needed in the future. She is intolerant to metformin  - I am going to adjust her insulin as below    MEDICATIONS: - Decrease Lantus to 80 units DAILY  - Novolog 15 units with EACH meal  - CF : Novolog (BG-130 /25)     EDUCATION / INSTRUCTIONS:  BG monitoring instructions: Patient is instructed to check her blood sugars 3 times a day, before meals .  Call  [facility] if: BG persistently < 70 or >  300. . I reviewed the Rule of 15 for the treatment of hypoglycemia in detail with the patient. Literature supplied.   2) Diabetic complications:   Eye: Does not have known diabetic retinopathy.   Neuro/ Feet: Does have known diabetic peripheral neuropathy.  Renal: Patient does not have known baseline CKD. She is on an ACEI/ARB at present.Check urine albumin/creatinine ratio yearly starting at time of diagnosis.    3) Lipids: Patient is on atorvastatin 40 mg daily. LDL above goal. No changes to day.   F/U in 3 months       Signed electronically by: Mack Guise, MD  [facility]  [facility] [address] [place], [place] [address] Phone: [phone] FAX: [phone]   [place], [facility] [place] [address] Phone: [phone]  Fax: [phone]    Return to Endocrinology clinic as below: Future Appointments  Date Time Provider [place]  11/02/2019  2:40 PM GI-315 MR 3 [id number] [id number]

## 2019-10-26 ENCOUNTER — Other Ambulatory Visit: Payer: Self-pay

## 2019-10-26 MED ORDER — NOVOLOG FLEXPEN 100 UNIT/ML ~~LOC~~ SOPN: 15.0000 [IU] | PEN_INJECTOR | Freq: Three times a day (TID) | SUBCUTANEOUS | 4 refills | Status: DC

## 2019-10-30 ENCOUNTER — Other Ambulatory Visit: Payer: Self-pay | Admitting: Orthopedic Surgery

## 2019-10-30 DIAGNOSIS — S6992XA Unspecified injury of left wrist, hand and finger(s), initial encounter: Secondary | ICD-10-CM

## 2019-11-02 ENCOUNTER — Other Ambulatory Visit: Payer: No Typology Code available for payment source

## 2019-11-03 ENCOUNTER — Other Ambulatory Visit: Payer: No Typology Code available for payment source

## 2019-11-25 ENCOUNTER — Other Ambulatory Visit: Payer: No Typology Code available for payment source

## 2019-11-26 ENCOUNTER — Ambulatory Visit
Admission: RE | Admit: 2019-11-26 | Discharge: 2019-11-26 | Disposition: A | Discharge status: Home / Self Care | Payer: No Typology Code available for payment source | Source: Ambulatory Visit | Attending: Orthopedic Surgery | Admitting: Orthopedic Surgery

## 2019-11-26 ENCOUNTER — Ambulatory Visit
Admission: RE | Admit: 2019-11-26 | Discharge: 2019-11-26 | Disposition: A | Discharge status: Home / Self Care | Payer: Medicare HMO | Source: Ambulatory Visit | Attending: Orthopedic Surgery | Admitting: Orthopedic Surgery

## 2019-11-26 ENCOUNTER — Other Ambulatory Visit: Payer: Self-pay

## 2019-11-26 DIAGNOSIS — S6992XA Unspecified injury of left wrist, hand and finger(s), initial encounter: Secondary | ICD-10-CM

## 2019-11-26 MED ORDER — IOPAMIDOL (ISOVUE-M 200) INJECTION 41%
3.0000 mL | Freq: Once | INTRAMUSCULAR | Status: AC
  Administered 2019-11-26: 3 mL via INTRA_ARTICULAR

## 2019-12-04 DIAGNOSIS — R200 Anesthesia of skin: Secondary | ICD-10-CM

## 2019-12-04 DIAGNOSIS — M2352 Chronic instability of knee, left knee: Secondary | ICD-10-CM

## 2019-12-04 HISTORY — DX: Anesthesia of skin: R20.0

## 2019-12-04 HISTORY — DX: Chronic instability of knee, left knee: M23.52

## 2019-12-09 ENCOUNTER — Telehealth: Payer: Self-pay

## 2019-12-09 ENCOUNTER — Telehealth: Payer: Self-pay | Admitting: Cardiology

## 2019-12-09 NOTE — Telephone Encounter (Signed)
New message   Patient states that she is having muscle pain with ezetimibe (ZETIA) 10 MG tablet. Please call to discuss.

## 2019-12-09 NOTE — Telephone Encounter (Signed)
Is the patient on a statin.  Do we have recent lab work like lipids from primary care.

## 2019-12-09 NOTE — Telephone Encounter (Signed)
Pt states that she could not take statins due to the muscle pain. Pt states that she is now having the same muscle pain taking the zetia as she did in the past with a statin.

## 2019-12-10 NOTE — Telephone Encounter (Signed)
Pt states that she could not take statins due to the muscle pain. Pt states that she is now having the same muscle pain taking the zetia as she did in the past with a statin. 

## 2019-12-11 NOTE — Telephone Encounter (Signed)
Let us get recent blood work including lipids

## 2019-12-30 ENCOUNTER — Telehealth: Payer: Self-pay | Admitting: *Deleted

## 2019-12-30 DIAGNOSIS — M65352 Trigger finger, left little finger: Secondary | ICD-10-CM

## 2019-12-30 DIAGNOSIS — G5602 Carpal tunnel syndrome, left upper limb: Secondary | ICD-10-CM

## 2019-12-30 HISTORY — DX: Trigger finger, left little finger: M65.352

## 2019-12-30 HISTORY — DX: Carpal tunnel syndrome, left upper limb: G56.02

## 2019-12-30 NOTE — Telephone Encounter (Signed)
   [facility] Pre-operative Risk Assessment    HEARTCARE STAFF: - Please ensure there is not already an duplicate clearance open for this procedure. - Under Visit Info/Reason for Call, type in Other and utilize the format Clearance MM/DD/YY or Clearance TBD. Do not use dashes or single digits. - If request is for dental extraction, please clarify the # of teeth to be extracted.  Request for surgical clearance:  1. What type of surgery is being performed? INJECTION LEFT CARPAL TUNNEL, REPAIR RADIAL COLATERAL LIGAMENT MCP LEFT SMALL FINGER WITH POSSIBLE PINNING   2. When is this surgery scheduled? 01/28/20   3. What type of clearance is required (medical clearance vs. Pharmacy clearance to hold med vs. Both)? MEDICAL  4. Are there any medications that need to be held prior to surgery and how long? ASA    5. Practice name and name of physician performing surgery? [facility]; DR. Lemmon   6. What is the office phone number? [phone]   7.   What is the office fax number? [phone]  8.   Anesthesia type (None, local, MAC, general) ? AXILLARY BLOCK   Julaine Hua 12/30/2019, 2:33 PM  _________________________________________________________________   (provider comments below)

## 2019-12-30 NOTE — Telephone Encounter (Signed)
Primary Cardiologist:Rajan R Revankar, MD  Chart reviewed as part of pre-operative protocol coverage. Because of Marie Chen past medical history and time since last visit, he/she will require a follow-up visit in order to better assess preoperative cardiovascular risk.  Pre-op covering staff: - Please schedule appointment and call patient to inform them. - Please contact requesting surgeon's office via preferred method (i.e, phone, fax) to inform them of need for appointment prior to surgery.  If applicable, this message will also be routed to pharmacy pool and/or primary cardiologist for input on holding anticoagulant/antiplatelet agent as requested below so that this information is available at time of patient's appointment.   Ronney Asters, NP  12/30/2019, 3:17 PM

## 2019-12-30 NOTE — Telephone Encounter (Signed)
Called pt and scheduled appt for pre-op clearance with Dr Tomie China, 01/20/2020@1 :40 PM  Forwarded to requesting party via [facility] fax function

## 2019-12-31 ENCOUNTER — Other Ambulatory Visit: Payer: Self-pay | Admitting: Orthopedic Surgery

## 2020-01-08 ENCOUNTER — Other Ambulatory Visit: Payer: Self-pay | Admitting: Cardiology

## 2020-01-08 DIAGNOSIS — I2510 Atherosclerotic heart disease of native coronary artery without angina pectoris: Secondary | ICD-10-CM

## 2020-01-08 DIAGNOSIS — I10 Essential (primary) hypertension: Secondary | ICD-10-CM

## 2020-01-08 DIAGNOSIS — E1169 Type 2 diabetes mellitus with other specified complication: Secondary | ICD-10-CM

## 2020-01-15 DIAGNOSIS — J189 Pneumonia, unspecified organism: Secondary | ICD-10-CM

## 2020-01-15 HISTORY — DX: Pneumonia, unspecified organism: J18.9

## 2020-01-20 ENCOUNTER — Ambulatory Visit: Payer: Medicare HMO | Admitting: Cardiology

## 2020-01-22 ENCOUNTER — Other Ambulatory Visit: Payer: Self-pay

## 2020-01-22 ENCOUNTER — Encounter: Payer: Self-pay | Admitting: Cardiology

## 2020-01-22 ENCOUNTER — Ambulatory Visit: Payer: Medicare HMO | Admitting: Cardiology

## 2020-01-22 VITALS — BP 108/73 | HR 85 | Ht 67.0 in | Wt 326.2 lb

## 2020-01-22 DIAGNOSIS — E088 Diabetes mellitus due to underlying condition with unspecified complications: Secondary | ICD-10-CM | POA: Diagnosis not present

## 2020-01-22 DIAGNOSIS — I10 Essential (primary) hypertension: Secondary | ICD-10-CM | POA: Diagnosis not present

## 2020-01-22 DIAGNOSIS — I2510 Atherosclerotic heart disease of native coronary artery without angina pectoris: Secondary | ICD-10-CM

## 2020-01-22 DIAGNOSIS — Z01810 Encounter for preprocedural cardiovascular examination: Secondary | ICD-10-CM | POA: Insufficient documentation

## 2020-01-22 HISTORY — DX: Encounter for preprocedural cardiovascular examination: Z01.810

## 2020-01-22 MED ORDER — NITROGLYCERIN 0.4 MG SL SUBL: 0.4000 mg | SUBLINGUAL_TABLET | SUBLINGUAL | 3 refills | Status: DC | PRN

## 2020-01-22 NOTE — Patient Instructions (Signed)

## 2020-01-22 NOTE — Progress Notes (Signed)
Cardiology Office Note:    Date:  01/22/2020   ID:  Marie Chen, DOB June 09, 1961, MRN [id number]  PCP:  [facility]  Cardiologist:  Garwin Brothers, MD   Referring MD: [facility]    ASSESSMENT:    1. Coronary artery disease involving native coronary artery of native heart without angina pectoris   2. Essential hypertension   3. Diabetes mellitus due to underlying condition with unspecified complications (HCC)   4. Preop cardiovascular exam    PLAN:    In order of problems listed above:  1. Primary prevention stressed with the patient.  Importance of compliance with diet medication stressed vocalized understanding 2. Preoperative cardiovascular risk assessment: In view of coronary angiography findings from last year I think she is not at high risk for coronary events during the aforementioned surgery.  Meticulous hemodynamic monitoring will further reduce the risk of coronary events.  She ambulates well and takes care of activities of daily living without any problems.  She has no issues with chest pain except as mentioned below. 3. Essential hypertension and diabetes mellitus: Managed by primary care physician.  Blood pressure stable.  She mentions to me that her hemoglobin A1c is elevated and I would most certainly want this to be assessed by primary care physician for his stratification also. 4. Ex-smoker: She promises never to go back to smoking. 5. Morbid obesity: Risks explained.  She plans to do better with exercise and diet. 6. Patient will be seen in follow-up appointment in 6 months or earlier if the patient has any concerns 7.    Medication Adjustments/Labs and Tests Ordered: Current medicines are reviewed at length with the patient today.  Concerns regarding medicines are outlined above.  No orders of the defined types were placed in this encounter.  No orders of the defined types were placed in this encounter.    No chief complaint  on file.    History of Present Illness:    Marie Chen is a 59 y.o. female.  She has past medical history of essential hypertension dyslipidemia and diabetes mellitus.  She is to smoke in the past but has quit.  She is planning to undergo wrist surgery.  This will be done under local anesthesia.  She denies any chest pain orthopnea or PND.  Occasionally when she has cough she complains of some chest tightness otherwise she is fine with activities of daily living.  At the time of my evaluation, the patient is alert awake oriented and in no distress.  Past Medical History:  Diagnosis Date  . Asthma   . COPD (chronic obstructive pulmonary disease) (HCC)   . Depression   . Diabetes mellitus without complication (HCC)   . GERD (gastroesophageal reflux disease)   . Hypertension   . Migraine   . Sjogren's disease (HCC)   . Sleep apnea    bipap  . Thyroid disease     Past Surgical History:  Procedure Laterality Date  . ANKLE SURGERY    . CARPAL TUNNEL RELEASE    . CHOLECYSTECTOMY    . HYSTEROSCOPY    . LEFT HEART CATH AND CORONARY ANGIOGRAPHY N/A 12/19/2018   Procedure: LEFT HEART CATH AND CORONARY ANGIOGRAPHY;  Surgeon: Yvonne Kendall, MD;  [facility];  Service: Cardiovascular;  Laterality: N/A;    Current Medications: Current Meds  Medication Sig  . ACCU-CHEK AVIVA PLUS test strip   . albuterol (PROVENTIL) (2.5 MG/3ML) 0.083% nebulizer solution   .  aspirin EC 81 MG tablet Take 1 tablet (81 mg total) by mouth daily.  . calcium carbonate (TUMS - DOSED IN MG ELEMENTAL CALCIUM) 500 MG chewable tablet Chew 2 tablets by mouth daily as needed for indigestion or heartburn.  . Carboxymethylcellul-Glycerin (LUBRICATING EYE DROPS OP) Place 1 drop into both eyes daily as needed (dry eyes).  Marland Kitchen escitalopram (LEXAPRO) 20 MG tablet Take 20 mg by mouth at bedtime.   . fluticasone (FLONASE) 50 MCG/ACT nasal spray Place 1 spray into both nostrils daily as needed (seasonal  allergies).   . Fluticasone-Umeclidin-Vilant (TRELEGY ELLIPTA) 100-62.5-25 MCG/INH AEPB   . HYDROcodone-acetaminophen (NORCO) 10-325 MG per tablet Take 1 tablet by mouth 3 (three) times daily as needed (pain).   . insulin aspart (NOVOLOG FLEXPEN) 100 UNIT/ML FlexPen Inject 15 Units into the skin 3 (three) times daily with meals. Max daily 70 units to include correction scale  . insulin glargine (LANTUS) 100 unit/mL SOPN Inject 105 Units into the skin daily before breakfast.   . ipratropium-albuterol (DUONEB) 0.5-2.5 (3) MG/3ML SOLN Take 3 mLs by nebulization every 6 (six) hours as needed (shortness of breath/wheezing).  Marland Kitchen levothyroxine (SYNTHROID) 125 MCG tablet Take 125 mcg by mouth daily.  Marland Kitchen lisinopril-hydrochlorothiazide (PRINZIDE,ZESTORETIC) 20-25 MG tablet Take 1 tablet by mouth daily with breakfast.  . Magnesium 400 MG TABS Take 400 mg by mouth every other day.  . montelukast (SINGULAIR) 10 MG tablet Take 10 mg by mouth daily with breakfast.  . MUCINEX 600 MG 12 hr tablet Take 1 tablet by mouth daily.  . naproxen sodium (ALEVE) 220 MG tablet Take 440 mg by mouth daily as needed (pain).  Marland Kitchen OVER THE COUNTER MEDICATION Take 300 mg by mouth at bedtime as needed (sleep). CBD oil   . pregabalin (LYRICA) 150 MG capsule Take 150 mg by mouth 3 (three) times daily.  Marland Kitchen terconazole (TERAZOL 7) 0.4 % vaginal cream Place 1 applicator vaginally at bedtime as needed (yeast infections).   . topiramate (TOPAMAX) 25 MG tablet Take 25 mg by mouth at bedtime.  . Vitamin D, Ergocalciferol, (DRISDOL) 1.25 MG (50000 UT) CAPS capsule Take 50,000 Units by mouth every Saturday.     Allergies:   Macrodantin [nitrofurantoin macrocrystal], Metformin and related, Atorvastatin, Hydromorphone hcl, Lipitor [atorvastatin calcium], Meperidine hcl, and Tetracyclines & related   Social History   Socioeconomic History  . Marital status: Widowed    Spouse name: Not on file  . Number of children: 4  . Years of education:  Not on file  . Highest education level: Not on file  Occupational History  . Occupation: CSR    Employer: [employer]  Tobacco Use  . Smoking status: Former Smoker    Types: Cigarettes    Quit date: 06/15/2019    Years since quitting: 0.6  . Smokeless tobacco: Never Used  Substance and Sexual Activity  . Alcohol use: No    Alcohol/week: 0.0 standard drinks  . Drug use: No  . Sexual activity: Not on file  Other Topics Concern  . Not on file  Social History Narrative  . Not on file   Social Determinants of Health   Financial Resource Strain:   . Difficulty of Paying Living Expenses:   Food Insecurity:   . Worried About Programme researcher, broadcasting/film/video in the Last Year:   . Barista in the Last Year:   Transportation Needs:   . Freight forwarder (Medical):   Marland Kitchen Lack of Transportation (Non-Medical):   Physical Activity:   .  Days of Exercise per Week:   . Minutes of Exercise per Session:   Stress:   . Feeling of Stress :   Social Connections:   . Frequency of Communication with Friends and Family:   . Frequency of Social Gatherings with Friends and Family:   . Attends Religious Services:   . Active Member of Clubs or Organizations:   . Attends Banker Meetings:   Marland Kitchen Marital Status:      Family History: The patient's family history includes Heart failure in her father. There is no history of Colon cancer.  ROS:   Please see the history of present illness.    All other systems reviewed and are negative.  EKGs/Labs/Other Studies Reviewed:    The following studies were reviewed today: EKG reveals sinus rhythm and nonspecific ST-T changes.   End, Cristal Deer, MD (Primary)    Procedures  LEFT HEART CATH AND CORONARY ANGIOGRAPHY  Conclusion  Conclusions: 1. Mild to moderate, non-obstructive coronary artery disease, including 30% mid LAD stenosis, 30-40% ostial D1 lesion, 20% ostial LCx stenosis, and 30% ostial and 20% mid RCA stenosis. 2. Mildly elevated  left-ventricular filling pressure with normal left ventricular contraction. 3. Catheter-induced LBBB during left heart catheterization.  Recommendations: 1. Medical therapy and risk factor modification to prevent progression of coronary artery disease. 2. Post-cath telemetry monitoring, given catheter-induced LBBB at the end of the procedure. 3. Consider outpatient evaluation of other alternative causes of chest pain.  Yvonne Kendall, MD [facility] Pager: [phone]    Recent Labs: 06/19/2019: B Natriuretic Peptide 49.0 06/20/2019: ALT 18; BUN 24; Creatinine, Ser 0.83; Hemoglobin 11.2; Platelets 315; Potassium 4.2; Sodium 135  Recent Lipid Panel    Component Value Date/Time   CHOL 179 01/07/2019 1019   TRIG 112 01/07/2019 1019   HDL 34 (L) 01/07/2019 1019   CHOLHDL 6 03/14/2009 1147   VLDL 25.6 03/14/2009 1147   LDLCALC 123 (H) 01/07/2019 1019   LDLDIRECT 179.5 03/14/2009 1147    Physical Exam:    VS:  BP 108/73   Pulse 85   Ht 5\' 7"  (1.702 m)   Wt (!) 326 lb 3.2 oz (148 kg)   SpO2 92%   BMI 51.09 kg/m     Wt Readings from Last 3 Encounters:  01/22/20 (!) 326 lb 3.2 oz (148 kg)  10/19/19 (!) 329 lb 12.8 oz (149.6 kg)  06/18/19 (!) 332 lb 14.3 oz (151 kg)     GEN: Patient is in no acute distress HEENT: Normal NECK: No JVD; No carotid bruits LYMPHATICS: No lymphadenopathy CARDIAC: Hear sounds regular, 2/6 systolic murmur at the apex. RESPIRATORY:  Clear to auscultation without rales, wheezing or rhonchi  ABDOMEN: Soft, non-tender, non-distended MUSCULOSKELETAL:  No edema; No deformity  SKIN: Warm and dry NEUROLOGIC:  Alert and oriented x 3 PSYCHIATRIC:  Normal affect   Signed, 08/16/19, MD  01/22/2020 11:53 AM    [facility]

## 2020-01-25 ENCOUNTER — Encounter: Payer: Self-pay | Admitting: Internal Medicine

## 2020-01-25 ENCOUNTER — Ambulatory Visit: Payer: No Typology Code available for payment source | Admitting: Internal Medicine

## 2020-01-25 ENCOUNTER — Other Ambulatory Visit: Payer: Self-pay

## 2020-01-25 ENCOUNTER — Ambulatory Visit (INDEPENDENT_AMBULATORY_CARE_PROVIDER_SITE_OTHER): Payer: Medicare HMO | Admitting: Internal Medicine

## 2020-01-25 VITALS — BP 142/84 | HR 100 | Ht 67.0 in | Wt 327.4 lb

## 2020-01-25 DIAGNOSIS — E669 Obesity, unspecified: Secondary | ICD-10-CM

## 2020-01-25 DIAGNOSIS — E890 Postprocedural hypothyroidism: Secondary | ICD-10-CM

## 2020-01-25 DIAGNOSIS — E1142 Type 2 diabetes mellitus with diabetic polyneuropathy: Secondary | ICD-10-CM | POA: Diagnosis not present

## 2020-01-25 DIAGNOSIS — E1169 Type 2 diabetes mellitus with other specified complication: Secondary | ICD-10-CM

## 2020-01-25 DIAGNOSIS — E1165 Type 2 diabetes mellitus with hyperglycemia: Secondary | ICD-10-CM | POA: Diagnosis not present

## 2020-01-25 DIAGNOSIS — Z794 Long term (current) use of insulin: Secondary | ICD-10-CM

## 2020-01-25 LAB — POCT GLYCOSYLATED HEMOGLOBIN (HGB A1C): Hemoglobin A1C: 8 % — AB (ref 4.0–5.6)

## 2020-01-25 LAB — T4, FREE: Free T4: 0.79 ng/dL (ref 0.60–1.60)

## 2020-01-25 LAB — TSH: TSH: 1.19 u[IU]/mL (ref 0.35–4.50)

## 2020-01-25 NOTE — Progress Notes (Signed)
Name: Marie Chen  Age/ Sex: 59 y.o., female   MRN/ DOB: [id number], 1961-01-11     PCP: [facility]   Reason for Endocrinology Evaluation: Type 2 Diabetes Mellitus  Initial Endocrine Consultative Visit: 10/19/2019    PATIENT IDENTIFIER: Marie Chen is a 59 y.o. female with a past medical history of DM, COPD,sjogren's syndrome , OSA on BiPAP, CAD,  Dyslipidemia and pancreatitis. The patient has followed with Endocrinology clinic since 10/19/2019 for consultative assistance with management of her diabetes.  DIABETIC HISTORY:  Ms. Nam was diagnosed with DM in 2015. Pt endorses genital skin irritation secondary to Metformin ?Marcelline Deist caused genital infections. Her hemoglobin A1c has ranged from 8.8% in 03/2019 , peaking at 10.5% in 2021.  On her initial visit to  Our clinic her A1c 9.0% . We adjust MDI regimen     THYROID HISTORY:  In 1994 she had RAI ablation secondary to hyperthyroidism. She needed LT-4 replacement shortly after RAI ablation .    Mother with cushing syndrome   SUBJECTIVE:   During the last visit (10/19/2019): A1c 9.0% . Adjusted MDI regimen   Today (01/25/2020): [name] is here for a follow up on diabetes management.  She checks her blood sugars 2-3 times daily, preprandial. The patient has not had hypoglycemic episodes since the last clinic visit.    She is scheduled for weight loss sx 07/2019  HOME ENDOCRINE REGIMEN:  Lantus 80 units daily  Novolog 15 units TIDQAC  CF : Novolog (BG-130/25)  Levothyroxine 125 mcg daily     Statin: yes ACE-I/ARB: yes    METER DOWNLOAD SUMMARY: Date range evaluated: 8/3-8/16/2021 Fingerstick Blood Glucose Tests = 33 Average Number Tests/Day = 2.4 Overall Mean FS Glucose = 185   BG Ranges: Low = 133 High = 258   Hypoglycemic Events/30 Days: BG < 50 = 0 Episodes of symptomatic severe hypoglycemia = 0    DIABETIC COMPLICATIONS: Microvascular complications:    Neuropathy   Denies: CKD, retinopathy   Last eye exam: Completed 2021  Macrovascular complications:   CAD ( medically treated per pt)   Denies: CAD, PVD, CVA   HISTORY:  Past Medical History:  Past Medical History:  Diagnosis Date   Asthma    COPD (chronic obstructive pulmonary disease) (HCC)    Depression    Diabetes mellitus without complication (HCC)    GERD (gastroesophageal reflux disease)    Hypertension    Migraine    Sjogren's disease (HCC)    Sleep apnea    bipap   Thyroid disease    Past Surgical History:  Past Surgical History:  Procedure Laterality Date   ANKLE SURGERY     CARPAL TUNNEL RELEASE     CHOLECYSTECTOMY     HYSTEROSCOPY     LEFT HEART CATH AND CORONARY ANGIOGRAPHY N/A 12/19/2018   Procedure: LEFT HEART CATH AND CORONARY ANGIOGRAPHY;  Surgeon: Yvonne Kendall, MD;  Location: [facility];  Service: Cardiovascular;  Laterality: N/A;    Social History:  reports that she quit smoking about 7 months ago. Her smoking use included cigarettes. She has never used smokeless tobacco. She reports that she does not drink alcohol and does not use drugs. Family History:  Family History  Problem Relation Age of Onset   Heart failure Father    Colon cancer Neg Hx      HOME MEDICATIONS: Allergies as of 01/25/2020      Reactions   Macrodantin [nitrofurantoin Macrocrystal] Rash  Metformin And Related Other (See Comments)   Causes yeast infections   Atorvastatin Other (See Comments)   Hydromorphone Hcl Nausea And Vomiting   Lipitor [atorvastatin Calcium] Diarrhea   Meperidine Hcl Nausea Only   Is ok if given with Phenergan   Tetracyclines & Related Rash      Medication List       Accurate as of January 25, 2020 11:16 AM. If you have any questions, ask your nurse or doctor.        STOP taking these medications   phentermine 15 MG capsule Stopped by: Scarlette Shorts, MD     TAKE these medications   Accu-Chek  Aviva Plus test strip Generic drug: glucose blood   albuterol (2.5 MG/3ML) 0.083% nebulizer solution Commonly known as: PROVENTIL   aspirin EC 81 MG tablet Take 1 tablet (81 mg total) by mouth daily.   calcium carbonate 500 MG chewable tablet Commonly known as: TUMS - dosed in mg elemental calcium Chew 2 tablets by mouth daily as needed for indigestion or heartburn.   escitalopram 20 MG tablet Commonly known as: LEXAPRO Take 20 mg by mouth at bedtime.   fluticasone 50 MCG/ACT nasal spray Commonly known as: FLONASE Place 1 spray into both nostrils daily as needed (seasonal allergies).   HYDROcodone-acetaminophen 10-325 MG tablet Commonly known as: NORCO Take 1 tablet by mouth 3 (three) times daily as needed (pain).   insulin glargine 100 unit/mL Sopn Commonly known as: LANTUS Inject 80 Units into the skin daily before breakfast.   ipratropium-albuterol 0.5-2.5 (3) MG/3ML Soln Commonly known as: DUONEB Take 3 mLs by nebulization every 6 (six) hours as needed (shortness of breath/wheezing).   levothyroxine 125 MCG tablet Commonly known as: SYNTHROID Take 125 mcg by mouth daily.   lisinopril-hydrochlorothiazide 20-25 MG tablet Commonly known as: ZESTORETIC Take 1 tablet by mouth daily with breakfast.   LUBRICATING EYE DROPS OP Place 1 drop into both eyes daily as needed (dry eyes).   Magnesium 400 MG Tabs Take 400 mg by mouth every other day.   montelukast 10 MG tablet Commonly known as: SINGULAIR Take 10 mg by mouth daily with breakfast.   Mucinex 600 MG 12 hr tablet Generic drug: guaiFENesin Take 1 tablet by mouth daily.   naproxen sodium 220 MG tablet Commonly known as: ALEVE Take 440 mg by mouth daily as needed (pain).   nitroGLYCERIN 0.4 MG SL tablet Commonly known as: NITROSTAT Place 1 tablet (0.4 mg total) under the tongue every 5 (five) minutes as needed.   NovoLOG FlexPen 100 UNIT/ML FlexPen Generic drug: insulin aspart Inject 15 Units into the  skin 3 (three) times daily with meals. Max daily 70 units to include correction scale   OVER THE COUNTER MEDICATION Take 300 mg by mouth at bedtime as needed (sleep). CBD oil   pregabalin 150 MG capsule Commonly known as: LYRICA Take 150 mg by mouth 3 (three) times daily.   terconazole 0.4 % vaginal cream Commonly known as: TERAZOL 7 Place 1 applicator vaginally at bedtime as needed (yeast infections).   topiramate 25 MG tablet Commonly known as: TOPAMAX Take 25 mg by mouth at bedtime.   Trelegy Ellipta 100-62.5-25 MCG/INH Aepb Generic drug: Fluticasone-Umeclidin-Vilant   Vitamin D (Ergocalciferol) 1.25 MG (50000 UNIT) Caps capsule Commonly known as: DRISDOL Take 50,000 Units by mouth every Saturday.        OBJECTIVE:   Vital Signs: BP (!) 142/84 (BP Location: Left Arm, Patient Position: Sitting, Cuff Size: Normal)  Pulse 100    Ht 5\' 7"  (1.702 m)    Wt (!) 327 lb 6.4 oz (148.5 kg)    SpO2 95%    BMI 51.28 kg/m   Wt Readings from Last 3 Encounters:  01/25/20 (!) 327 lb 6.4 oz (148.5 kg)  01/22/20 (!) 326 lb 3.2 oz (148 kg)  10/19/19 (!) 329 lb 12.8 oz (149.6 kg)     Exam: General: Pt appears well and is in NAD  Lungs: Clear with good BS bilat with no rales, rhonchi, or wheezes  Heart: RRR with normal S1 and S2 and no gallops; no murmurs; no rub  Extremities: No pretibial edema.   Neuro: MS is good with appropriate affect, pt is alert and Ox3    DM foot exam: 10/19/2019 The skin of the feet is without sores or ulcerations but has thickened toe nails and plantar formation noted B/L  The pedal pulses are 2+ on right and 2+ on left. The sensation is decreased  to a screening 5.07, 10 gram monofilament bilaterally   DATA REVIEWED:  Lab Results  Component Value Date   HGBA1C 8.0 (A) 01/25/2020   HGBA1C 9.0 (A) 10/19/2019   HGBA1C 10.5 (H) 06/19/2019   Lab Results  Component Value Date   MICROALBUR 0.7 03/14/2009   LDLCALC 123 (H) 01/07/2019   CREATININE  0.83 06/20/2019   Lab Results  Component Value Date   MICRALBCREAT 2.3 03/14/2009     Lab Results  Component Value Date   CHOL 179 01/07/2019   HDL 34 (L) 01/07/2019   LDLCALC 123 (H) 01/07/2019   LDLDIRECT 179.5 03/14/2009   TRIG 112 01/07/2019   CHOLHDL 6 03/14/2009        07/01/2019  BUN/Cr 13/0.62 GFR 98   03/25/2019 Tg 140 LDL 101 A1c 8.8 %  Results for Liana CrockerSTANFIELD, Yaresly L (MRN [id number]) as of 01/26/2020 19:22  Ref. Range 01/25/2020 10:50  TSH Latest Ref Range: 0.35 - 4.50 uIU/mL 1.19  T4,Free(Direct) Latest Ref Range: 0.60 - 1.60 ng/dL 2.130.79     ASSESSMENT / PLAN / RECOMMENDATIONS:   1) Type 2 Diabetes Mellitus, Sub-Optimally controlled, With Neuropathic  complications - Most recent A1c of 8.0 %. Goal A1c < 7.0 %.    - A1c down from 9.0%  - I have praised the pt on improved glycemic control - Unfortunately due to reported personal history of pancreatitis , DPP-4 inhibitors and GLP-1 agonists are CONTRAINDICATED - She is intolerant to metformin and SGLT-2 inhibitors.     MEDICATIONS:  Continue Lantus 80 units daily   Increase Novolog 18 units daily   CF: Novolog ( BG- 130/25)   EDUCATION / INSTRUCTIONS:  BG monitoring instructions: Patient is instructed to check her blood sugars 3 times a day, before meals .  Call [facility] if: BG persistently < 70   I reviewed the Rule of 15 for the treatment of hypoglycemia in detail with the patient. Literature supplied.    2) Diabetic complications:   Eye: Does not have known diabetic retinopathy.   Neuro/ Feet: Does have known diabetic peripheral neuropathy.  Renal: Patient does not have known baseline CKD. She is on an ACEI/ARB at present.Check urine albumin/creatinine ratio yearly starting at time of diagnosis.      3) Postablative Hypothyroidism :  - Pt is clinically euthyroid  - No local neck symptoms - Pt educated extensively on the correct way to take levothyroxine  (first thing in the morning with water, 30 minutes before eating  or taking other medications). - Pt encouraged to double dose the following day if she were to miss a dose given long half-life of levothyroxine.   Medication : Levothyroxine 125 mcg daily      F/U in 4 months    Signed electronically by: Lyndle Herrlich, MD  [facility]  [facility] [address] [place] [place], [place] [address] Phone: [phone] FAX: [phone]   CC: [facility] [address] [place] [address] Phone: [phone]  Fax: [phone]  Return to Endocrinology clinic as below: Future Appointments  Date Time Provider Department Center  05/26/2020 10:50 AM Antoni Stefan, Konrad Dolores, MD [facility] None  07/25/2020  8:20 AM Revankar, Aundra Dubin, MD [facility] None

## 2020-01-25 NOTE — Patient Instructions (Addendum)
-    Lantus  80 units DAILY  - Novolog 18 units with EACH meal  - Novolog correctional insulin: ADD extra units on insulin to your meal-time Novolog dose if your blood sugars are higher than 155. Use the scale below to help guide you:   Blood sugar before meal Number of units to inject  Less than 155 0 unit  156 -  180 1 units  181 -  205 2 units  206 -  230 3 units  231-  255 4 units  256 -  280 5 units  281 -  305 6 units  306 -  330 7 units  331-  355 8 units    You are on levothyroxine - which is your thyroid hormone supplement. You MUST take this consistently.  You should take this first thing in the morning on an empty stomach with water. You should not take it with other medications. Wait to 1hr prior to eating. If you are taking any vitamins - please take these in the evening.   If you miss a dose, please take your missed dose the following day (double the dose for that day). You should have a pill box for ONLY levothyroxine on your bedside table to help you remember to take your medications.       HOW TO TREAT LOW BLOOD SUGARS (Blood sugar LESS THAN 70 MG/DL)  Please follow the RULE OF 15 for the treatment of hypoglycemia treatment (when your (blood sugars are less than 70 mg/dL)    STEP 1: Take 15 grams of carbohydrates when your blood sugar is low, which includes:   3-4 GLUCOSE TABS  OR  3-4 OZ OF JUICE OR REGULAR SODA OR  ONE TUBE OF GLUCOSE GEL     STEP 2: RECHECK blood sugar in 15 MINUTES STEP 3: If your blood sugar is still low at the 15 minute recheck --> then, go back to STEP 1 and treat AGAIN with another 15 grams of carbohydrates.     PUMP Options : T-slim (Tandem) and Medtronic - please look up on you tube and online , and will discus on next visit

## 2020-01-26 DIAGNOSIS — Z794 Long term (current) use of insulin: Secondary | ICD-10-CM | POA: Insufficient documentation

## 2020-01-26 DIAGNOSIS — E1142 Type 2 diabetes mellitus with diabetic polyneuropathy: Secondary | ICD-10-CM | POA: Insufficient documentation

## 2020-01-26 HISTORY — DX: Type 2 diabetes mellitus with diabetic polyneuropathy: E11.42

## 2020-01-26 HISTORY — DX: Long term (current) use of insulin: Z79.4

## 2020-01-26 LAB — HM DIABETES EYE EXAM

## 2020-01-26 MED ORDER — NOVOLOG FLEXPEN 100 UNIT/ML ~~LOC~~ SOPN: 18.0000 [IU] | PEN_INJECTOR | Freq: Three times a day (TID) | SUBCUTANEOUS | 3 refills | Status: DC

## 2020-01-26 MED ORDER — LEVOTHYROXINE SODIUM 125 MCG PO TABS: 125.0000 ug | ORAL_TABLET | Freq: Every day | ORAL | 3 refills | Status: DC

## 2020-01-27 ENCOUNTER — Telehealth: Payer: Self-pay | Admitting: General Practice

## 2020-01-27 NOTE — Telephone Encounter (Signed)
° °  Primary Cardiologist: Garwin Brothers, MD  Chart reviewed as part of pre-operative protocol coverage. Given past medical history and time since last visit, based on ACC/AHA guidelines, [facility] UEDA would be at acceptable risk for the planned procedure without further cardiovascular testing.   I will route this recommendation to the requesting party via Epic fax function and remove from pre-op pool.  Please call with questions.  Thomasene Ripple. Jullianna Gabor NP-C    01/27/2020, 4:07 PM [facility] [address] Office [phone] Fax [phone]

## 2020-01-28 NOTE — Telephone Encounter (Signed)
Spoke with Helmut Muster the surgery scheduler from Dr. Merrilee Seashore office and she wants to know if it is ok for the patient to hold the ASA and for how long prior to 02/04/20. Please advise

## 2020-02-01 ENCOUNTER — Encounter (HOSPITAL_COMMUNITY): Payer: Self-pay

## 2020-02-01 ENCOUNTER — Other Ambulatory Visit: Payer: Self-pay | Admitting: Cardiology

## 2020-02-01 ENCOUNTER — Encounter (HOSPITAL_COMMUNITY)
Admission: RE | Admit: 2020-02-01 | Discharge: 2020-02-01 | Disposition: A | Discharge status: Home / Self Care | Payer: Medicare HMO | Source: Ambulatory Visit | Attending: Orthopedic Surgery | Admitting: Orthopedic Surgery

## 2020-02-01 ENCOUNTER — Other Ambulatory Visit: Payer: Self-pay

## 2020-02-01 ENCOUNTER — Other Ambulatory Visit (HOSPITAL_COMMUNITY)
Admission: RE | Admit: 2020-02-01 | Discharge: 2020-02-01 | Disposition: A | Discharge status: Home / Self Care | Payer: Medicare HMO | Source: Ambulatory Visit | Attending: Orthopedic Surgery | Admitting: Orthopedic Surgery

## 2020-02-01 DIAGNOSIS — G4733 Obstructive sleep apnea (adult) (pediatric): Secondary | ICD-10-CM | POA: Insufficient documentation

## 2020-02-01 DIAGNOSIS — I2510 Atherosclerotic heart disease of native coronary artery without angina pectoris: Secondary | ICD-10-CM | POA: Insufficient documentation

## 2020-02-01 DIAGNOSIS — K760 Fatty (change of) liver, not elsewhere classified: Secondary | ICD-10-CM | POA: Diagnosis not present

## 2020-02-01 DIAGNOSIS — Z20822 Contact with and (suspected) exposure to covid-19: Secondary | ICD-10-CM | POA: Insufficient documentation

## 2020-02-01 DIAGNOSIS — Z87891 Personal history of nicotine dependence: Secondary | ICD-10-CM | POA: Insufficient documentation

## 2020-02-01 DIAGNOSIS — I10 Essential (primary) hypertension: Secondary | ICD-10-CM | POA: Insufficient documentation

## 2020-02-01 DIAGNOSIS — Z794 Long term (current) use of insulin: Secondary | ICD-10-CM | POA: Insufficient documentation

## 2020-02-01 DIAGNOSIS — G5602 Carpal tunnel syndrome, left upper limb: Secondary | ICD-10-CM | POA: Diagnosis not present

## 2020-02-01 DIAGNOSIS — Z7901 Long term (current) use of anticoagulants: Secondary | ICD-10-CM | POA: Diagnosis not present

## 2020-02-01 DIAGNOSIS — E039 Hypothyroidism, unspecified: Secondary | ICD-10-CM | POA: Diagnosis not present

## 2020-02-01 DIAGNOSIS — Z01812 Encounter for preprocedural laboratory examination: Secondary | ICD-10-CM | POA: Insufficient documentation

## 2020-02-01 DIAGNOSIS — Z7982 Long term (current) use of aspirin: Secondary | ICD-10-CM | POA: Diagnosis not present

## 2020-02-01 DIAGNOSIS — E118 Type 2 diabetes mellitus with unspecified complications: Secondary | ICD-10-CM | POA: Diagnosis not present

## 2020-02-01 DIAGNOSIS — Z6841 Body Mass Index (BMI) 40.0 and over, adult: Secondary | ICD-10-CM | POA: Insufficient documentation

## 2020-02-01 DIAGNOSIS — Z79899 Other long term (current) drug therapy: Secondary | ICD-10-CM | POA: Diagnosis not present

## 2020-02-01 DIAGNOSIS — M797 Fibromyalgia: Secondary | ICD-10-CM | POA: Insufficient documentation

## 2020-02-01 DIAGNOSIS — I209 Angina pectoris, unspecified: Secondary | ICD-10-CM

## 2020-02-01 DIAGNOSIS — J449 Chronic obstructive pulmonary disease, unspecified: Secondary | ICD-10-CM | POA: Diagnosis not present

## 2020-02-01 HISTORY — DX: Fatty (change of) liver, not elsewhere classified: K76.0

## 2020-02-01 HISTORY — DX: Fibromyalgia: M79.7

## 2020-02-01 HISTORY — DX: Atherosclerotic heart disease of native coronary artery without angina pectoris: I25.10

## 2020-02-01 HISTORY — DX: Hyperlipidemia, unspecified: E78.5

## 2020-02-01 HISTORY — DX: Hypothyroidism, unspecified: E03.9

## 2020-02-01 HISTORY — DX: Dyspnea, unspecified: R06.00

## 2020-02-01 LAB — COMPREHENSIVE METABOLIC PANEL
ALT: 19 U/L (ref 0–44)
AST: 28 U/L (ref 15–41)
Albumin: 3.3 g/dL — ABNORMAL LOW (ref 3.5–5.0)
Alkaline Phosphatase: 88 U/L (ref 38–126)
Anion gap: 10 (ref 5–15)
BUN: 10 mg/dL (ref 6–20)
CO2: 25 mmol/L (ref 22–32)
Calcium: 9.2 mg/dL (ref 8.9–10.3)
Chloride: 101 mmol/L (ref 98–111)
Creatinine, Ser: 0.65 mg/dL (ref 0.44–1.00)
GFR calc Af Amer: >60 mL/min (ref >60)
GFR calc non Af Amer: >60 mL/min (ref >60)
Glucose, Bld: 180 mg/dL — ABNORMAL HIGH (ref 70–99)
Potassium: 4 mmol/L (ref 3.5–5.1)
Sodium: 136 mmol/L (ref 135–145)
Total Bilirubin: 0.5 mg/dL (ref 0.3–1.2)
Total Protein: 7.2 g/dL (ref 6.5–8.1)

## 2020-02-01 LAB — CBC
HCT: 33.9 % — ABNORMAL LOW (ref 36.0–46.0)
Hemoglobin: 9.5 g/dL — ABNORMAL LOW (ref 12.0–15.0)
MCH: 21.3 pg — ABNORMAL LOW (ref 26.0–34.0)
MCHC: 28 g/dL — ABNORMAL LOW (ref 30.0–36.0)
MCV: 76.2 fL — ABNORMAL LOW (ref 80.0–100.0)
Platelets: 358 10*3/uL (ref 150–400)
RBC: 4.45 MIL/uL (ref 3.87–5.11)
RDW: 17.2 % — ABNORMAL HIGH (ref 11.5–15.5)
WBC: 9.2 10*3/uL (ref 4.0–10.5)
nRBC: 0 % (ref 0.0–0.2)

## 2020-02-01 LAB — GLUCOSE, CAPILLARY: Glucose-Capillary: 164 mg/dL — ABNORMAL HIGH (ref 70–99)

## 2020-02-01 LAB — SARS CORONAVIRUS 2 (TAT 6-24 HRS): SARS Coronavirus 2: NEGATIVE

## 2020-02-01 NOTE — Progress Notes (Addendum)
[id number] Rosalita Levan, [place] - [address] AT [facility] [address] [place] [address] Phone: [phone] Fax: [phone]  [facility] - [place], [place] - [address] [place] [address] Phone: [phone] Fax: [phone]      Your procedure is scheduled on Thursday, 02/04/20 at 10:30 am.  Report to [facility] Main Entrance "A" at 8:30 A.M., and check in at the Admitting office.  Call this number if you have problems the morning of surgery:  [phone]  Call [phone] if you have any questions prior to your surgery date Monday-Friday 8am-4pm    Remember:  Do not eat after midnight the night before your surgery (Wed)  You may drink clear liquids until 7:30 am the morning of your surgery.   Clear liquids allowed are: Water, Non-Citrus Juices (without pulp), Carbonated Beverages, Clear Tea, Black Coffee Only, and Gatorade   Please complete your PRE-SURGERY ENSURE that was provided to you by 7:30 am the morning of surgery.  Please, if able, drink it in one setting. DO NOT SIP.   Take these medicines the morning of surgery with A SIP OF WATER: Albuterol neb treatment if needed Albuterol inhaler if needed - Bring Flonase Nasal Spray TRELEGY ELLIPTA Inhaler HYDROcodone-acetaminophen (NORCO) ipratropium-albuterol (DUONEB) neb tx if needed levothyroxine (SYNTHROID) montelukast (SINGULAIR) nitroGLYCERIN (NITROSTAT) if needed pregabalin (LYRICA)  Diabetes . THE MORNING OF SURGERY, take 40 units of Lantus Insulin.  . If your CBG is greater than 220 mg/dL, you may take  of your sliding scale (correction) dose of Novolog Insulin.  . If your blood sugar is less than 70 mg/dL, you will need to treat for low blood sugar: o Treat a low blood sugar (less than 70 mg/dL) with  cup of clear juice (cranberry or apple), 4 glucose tablets, OR glucose gel. o Recheck blood  sugar in 15 minutes after treatment (to make sure it is greater than 70 mg/dL). If your blood sugar is not greater than 70 mg/dL on recheck, call [phone] for further instructions.   As of today, STOP taking any Aspirin (unless otherwise instructed by your surgeon) Aleve, Naproxen, Ibuprofen, Motrin, Advil, Goody's, BC's, all herbal medications, fish oil, and all vitamins.                      Do not wear jewelry, make up, or nail polish            Do not wear lotions, powders, perfumes, or deodorant.            Do not shave 48 hours prior to surgery.              Do not bring valuables to the hospital.            [facility] is not responsible for any belongings or valuables.  Do NOT Smoke (Tobacco/Vaping) or drink Alcohol 24 hours prior to your procedure If you use a CPAP at night, you may bring all equipment for your overnight stay.   Contacts, glasses, dentures or bridgework may not be worn into surgery.      For patients admitted to the hospital, discharge time will be determined by your treatment team.   Patients discharged the day of surgery will not be allowed to drive home, and someone needs to stay with them for 24 hours.    Special instructions:   [place]-  Preparing For Surgery  Before surgery, you can play an important role. Because skin is not sterile, your skin needs to be as free of germs as possible. You can reduce the number of germs on your skin by washing with CHG (chlorahexidine gluconate) Soap before surgery.  CHG is an antiseptic cleaner which kills germs and bonds with the skin to continue killing germs even after washing.    Oral Hygiene is also important to reduce your risk of infection.  Remember - BRUSH YOUR TEETH THE MORNING OF SURGERY WITH YOUR REGULAR TOOTHPASTE  Please do not use if you have an allergy to CHG or antibacterial soaps. If your skin becomes reddened/irritated stop using the CHG.  Do not shave (including legs and underarms) for at  least 48 hours prior to first CHG shower. It is OK to shave your face.  Please follow these instructions carefully.   1. Shower the NIGHT BEFORE SURGERY Wed and the MORNING OF SURGERY Thurs with CHG Soap.   2. If you chose to wash your hair, wash your hair first as usual with your normal shampoo.  3. After you shampoo, rinse your hair and body thoroughly to remove the shampoo.  4. Use CHG as you would any other liquid soap. You can apply CHG directly to the skin and wash gently with a scrungie or a clean washcloth.   5. Apply the CHG Soap to your body ONLY FROM THE NECK DOWN.  Do not use on open wounds or open sores. Avoid contact with your eyes, ears, mouth and genitals (private parts). Wash Face and genitals (private parts)  with your normal soap.   6. Wash thoroughly, paying special attention to the area where your surgery will be performed.  7. Thoroughly rinse your body with warm water from the neck down.  8. DO NOT shower/wash with your normal soap after using and rinsing off the CHG Soap.  9. Pat yourself dry with a CLEAN TOWEL.  10. Wear CLEAN PAJAMAS to bed the night before surgery  11. Place CLEAN SHEETS on your bed the night of your first shower and DO NOT SLEEP WITH PETS.   Day of Surgery: Wear Clean/Comfortable clothing the morning of surgery Do not apply any deodorants/lotions.   Remember to brush your teeth WITH YOUR REGULAR TOOTHPASTE.   Please read over the following fact sheets that you were given.

## 2020-02-01 NOTE — Telephone Encounter (Signed)
If it is absolutely necessary then aspirin can be held for the shortest amount of time that is required by the surgeon.

## 2020-02-01 NOTE — Progress Notes (Signed)
error 

## 2020-02-01 NOTE — Progress Notes (Signed)
PCP - [facility] Hulan Fray, NP Endocrinologist - Dr [facility] Cardiologist - Dr Tomie China  Chest x-ray - n/a EKG - 01/22/20 Stress Test - n/a ECHO - n/a Cardiac Cath - 12/19/18  Sleep Study - Yes  Uses CPAP nightly  Fasting Blood Sugar - 160-190s Checks Blood Sugar  3 times a day  Aspirin Instructions: Last dose on Sar, 01/30/20 per patient.   ERAS: Clears til 7:30 am DOS.  G2 low sugar drink given.  Anesthesia review: Yes  STOP now taking any Aspirin (unless otherwise instructed by your surgeon), Aleve, Naproxen, Ibuprofen, Motrin, Advil, Goody's, BC's, all herbal medications, fish oil, and all vitamins.   Coronavirus Screening Covid test scheduled on 02/01/20. Do you have any of the following symptoms:  Cough yes/no: No Fever (>100.20F)  yes/no: No Runny nose yes/no: No Sore throat yes/no: No Difficulty breathing/shortness of breath  No  Have you traveled in the last 14 days and where? yes/no: No  Patient verbalized understanding of instructions that were given to them at the PAT appointment.

## 2020-02-02 NOTE — Anesthesia Preprocedure Evaluation (Addendum)
Anesthesia Evaluation  Patient identified by MRN, date of birth, ID band Patient awake    Reviewed: Allergy & Precautions, NPO status , Patient's Chart, lab work & pertinent test results  History of Anesthesia Complications Negative for: history of anesthetic complications  Airway Mallampati: III  TM Distance: >3 FB Neck ROM: Full    Dental  (+) Edentulous Upper, Edentulous Lower   Pulmonary asthma , sleep apnea, Continuous Positive Airway Pressure Ventilation and Oxygen sleep apnea , COPD,  COPD inhaler, Patient abstained from smoking., former smoker,    Pulmonary exam normal        Cardiovascular hypertension, Pt. on medications + CAD  Normal cardiovascular exam     Neuro/Psych  Headaches, PSYCHIATRIC DISORDERS Depression  RLS     GI/Hepatic Neg liver ROS, PUD, GERD  Controlled, IBS    Endo/Other  diabetes, Type 2, Insulin DependentHypothyroidism Morbid obesity  Renal/GU negative Renal ROS     Musculoskeletal  (+) Arthritis , Fibromyalgia -, narcotic dependent  Abdominal   Peds  Hematology  (+) anemia ,   Anesthesia Other Findings Sjogren's syndrome Previous Covid infection Covid test negative See PAT note regarding pulmonary and cardiac clearance  Reproductive/Obstetrics                           Anesthesia Physical Anesthesia Plan  ASA: III  Anesthesia Plan: Regional and MAC   Post-op Pain Management:    Induction: Intravenous  PONV Risk Score and Plan: 2 and Propofol infusion, Treatment may vary due to age or medical condition and Dexamethasone  Airway Management Planned: Natural Airway and Simple Face Mask  Additional Equipment: None  Intra-op Plan:   Post-operative Plan:   Informed Consent: I have reviewed the patients History and Physical, chart, labs and discussed the procedure including the risks, benefits and alternatives for the proposed anesthesia with the  patient or authorized representative who has indicated his/her understanding and acceptance.       Plan Discussed with: CRNA and Anesthesiologist  Anesthesia Plan Comments:       Anesthesia Quick Evaluation

## 2020-02-02 NOTE — Progress Notes (Signed)
Anesthesia Chart Review:  Case: [id number] Date/Time: 02/04/20 1015   Procedures:      INJECTION LEFT CARPAL TUNNEL (Left )     REPAIR RADIAL COLLATERAL LIGAMENT METACARPAL PHALANGEAL LEFT SMALL FINGER; POSSIBLE PINNING (Left ) - AXILLARY BLOCK   Anesthesia type: Choice   Pre-op diagnosis:      RUPTURE RADIAL COLLATERAL LIGAMENT METACARPAL PHALANGEAL LEFT SMALL FINGER     LEFT CARPAL TUNNEL SYNDROME   [facility] RO[facility] 06 / [facility] OR   Surgeons: Cindee Salt, MD      DISCUSSION: Patient is a 59 year old Chen scheduled for the above procedure.   History includes former smoker (quit 06/15/19), CAD (mild-moderate CAD 12/2018, medical therapy), HTN, OSA (uses CPAP with 4L O2), HLD, COPD, exertional dyspnea, severe asthma (Dr. Blenda Nicely), DM2, post-ablation hypothyroidism, fatty liver, fibromyalgia, Sjogren's disease, migraines. BMI 51.31 consistent with morbid obesity. Family history of CAD (reported father died of MI at age 61).  Preoperative evaluation with Dr. Tomie China on 01/22/20. Preoperative cardiology risk assessment outlined on 01/27/20 by Edd Fabian, NP, "Given past medical history and time since last visit, based on ACC/AHA guidelines, YERANIA CHAMORRO would be at acceptable risk for the planned procedure without further cardiovascular testing." She had been off ASA 01/30/20, but restarted  02/02/20 per Dr. Merrilee Seashore office after input with cardiology.   Preoperative pulmonology visit with Dr. Blenda Nicely on 01/13/20.  He wrote, "Patient is at moderate risk for general surgery due to respiratory issues.  I think that the patient should be given a dose of Solu-Medrol 125 mg IV a few hours before surgery and should also be given a nebulizer treatment an hour before and also after anesthesia.  Patient should be on the lowest oxygen (post surgery) to keep the pulse ox between 90-92%.  The patient may need BiPAP/CPAP after anesthesia."  I called and spoke with patient. Last asthma exacerbation several weeks  ago (treated with prednisone taper and antibiotic). Usual pulmonary regimen: Trilogy Q AM, Singulair Q AM, Mucincex as needed. Albuterol MDI 1-2x/day during hot months. Nebulizer PRN, but not used in > 1 week. At baseline she has an occasional productive cough with clear sputum, and typically contacts Dr. Blenda Nicely for antibiotics if worsening symptoms or changes in sputum color. Also not unusual to have some morning congestion that clears throughout the day. Denied SOB at rest. Has chronic stable DOE with activities such as dusting or vacuuming. No change in occasional chest tightness (as mentioned in cardiology pre-operative evaluate notes; she reports it can happen at anytime, rest or activity; non-obstructive CAD 12/2018). Reports chronic mild ankle edema that is worse at the end of the day, no changes. Uses 2 pillows to sleep at night. In regards to anemia, she says she sees PCP about every 3 months and has been told to take a MVI with iron. Denied hematochezia.   Preoperative COVID-19 test negative on 02/01/20. Reviewed case with anesthesiologist Jairo Ben, MD. Anesthesia team to evaluate on the day of surgery and discuss definitive anesthesia plan--patient is anxious about getting a nerve block.   VS: Pulse 88   Temp 36.8 C (Oral)   Resp 20   Ht 5\' 7"  (1.702 m)   Wt (!) 148.6 kg   LMP  (LMP Unknown)   SpO2 97%   BMI 51.31 kg/m     PROVIDERS: [facility], [facility] if where she receives primary care - Revankar, Eriz, MD is cardiologist. Last visit 01/22/20 for follow-up and pre-operative  evaluation. He notes no chest pain, orthopnea, PND, but occasional cough and chest tightness. No issues with activities of daily living. No obstructive CAD by LHC ~ 1 year ago. He did not think she was at high risk for coronary events for this procedure. Marcellus Scott, MD is pulmonologist [facility]) - Lonzo Cloud, Brent Bulla, MD is endocrinologist. Last  visit 01/25/20. - She is also being followed at [facility].    LABS: Preoperative labs noted. H/H 9.5/33.9, down from 10.8-11.2/35.8-37.9 in 06/2019. A1c 8.0% on 01/25/20 (down from 9.0% 3 months ago). (all labs ordered are listed, but only abnormal results are displayed)  Labs Reviewed  GLUCOSE, CAPILLARY - Abnormal; Notable for the following components:      Result Value   Glucose-Capillary 164 (*)    All other components within normal limits  CBC - Abnormal; Notable for the following components:   Hemoglobin 9.5 (*)    HCT 33.9 (*)    MCV 76.2 (*)    MCH 21.3 (*)    MCHC 28.0 (*)    RDW 17.2 (*)    All other components within normal limits  COMPREHENSIVE METABOLIC PANEL - Abnormal; Notable for the following components:   Glucose, Bld 180 (*)    Albumin 3.3 (*)    All other components within normal limits    PFTs 01/13/20 [facility] Pulmonary): FVC 2.47 (71%), post BD 2.58 (75%).  FEV1 2.25 (82%), post BD 2.33 (85%).  DLCO 15.81 (62%).   Interpretation: Obstruction: The test indicates no obstructive airway abnormalities. Restriction: Mild restriction in lung volume was noted. Bronchodilator effect: Bronchodilator was administered, however results were not palpable due to absence of obstruction. Diffusion capacity: Pulmonary diffusion capacity is mildly diminished. Flow volume loop: Flow volume loop is normal. - The PFTs are consistent with restrictive lung diseases.   IMAGES: MRI Left fingers 11/26/19: IMPRESSION: - Focal perforation at the dorsal surface of the fifth MCP radial collateral ligament with surrounding soft tissue edema. There is intrasubstance sprain throughout the remainder of the radial collateral ligament. - Mild fifth MCP joint osteoarthritis.   EKG: 01/22/20: NSR, possible anterior infarct (age undetermined).   CV: LHC 12/19/18: Conclusions: 1. Mild to moderate, non-obstructive coronary artery disease, including 30% mid  LAD stenosis, 30-40% ostial D1 lesion, 20% ostial LCx stenosis, and 30% ostial and 20% mid RCA stenosis. 2. Mildly elevated left-ventricular filling pressure with normal left ventricular contraction. 3. Catheter-induced LBBB during left heart catheterization. Recommendations: 1. Medical therapy and risk factor modification to prevent progression of coronary artery disease. 2. Post-cath telemetry monitoring, given catheter-induced LBBB at the end of the procedure. 3. Consider outpatient evaluation of other alternative causes of chest pain.   Past Medical History:  Diagnosis Date  . Asthma    severe  . COPD (chronic obstructive pulmonary disease) (HCC)    patient denies this dx  . Coronary artery disease   . Depression   . Diabetes mellitus without complication (HCC)    type 2  . Dyspnea    with exertion  . Fatty liver   . Fibromyalgia   . GERD (gastroesophageal reflux disease)   . HLD (hyperlipidemia)    diet controlled  . Hypertension   . Hypothyroidism   . Migraine   . Sjogren's disease (HCC)   . Sleep apnea    CPAP nightly  . Thyroid disease     Past Surgical History:  Procedure Laterality Date  . ANKLE SURGERY  2010  .  BACK SURGERY    . CARPAL TUNNEL RELEASE Bilateral   . CHOLECYSTECTOMY    . COLONOSCOPY  04/2014  . FOOT SURGERY     bunion surgery bialteral  . HYSTEROSCOPY    . LEFT HEART CATH AND CORONARY ANGIOGRAPHY N/A 12/19/2018   Procedure: LEFT HEART CATH AND CORONARY ANGIOGRAPHY;  Surgeon: Yvonne Kendall, MD;  [facility] INV[facility];  Service: Cardiovascular;  Laterality: N/A;  . RESECTION DISTAL CLAVICAL Right   . TUBAL LIGATION    . UPPER GI ENDOSCOPY  04/2014  . WISDOM TOOTH EXTRACTION      MEDICATIONS: . ACCU-CHEK AVIVA PLUS test strip  . albuterol (PROVENTIL) (2.5 MG/3ML) 0.083% nebulizer solution  . albuterol (VENTOLIN HFA) 108 (90 Base) MCG/ACT inhaler  . aspirin EC 81 MG tablet  . calcium carbonate (TUMS - DOSED IN MG ELEMENTAL  CALCIUM) 500 MG chewable tablet  . Carboxymethylcellul-Glycerin (LUBRICATING EYE DROPS OP)  . escitalopram (LEXAPRO) 20 MG tablet  . fluticasone (FLONASE) 50 MCG/ACT nasal spray  . Fluticasone-Umeclidin-Vilant (TRELEGY ELLIPTA) 100-62.5-25 MCG/INH AEPB  . HYDROcodone-acetaminophen (NORCO) 10-325 MG per tablet  . insulin aspart (NOVOLOG FLEXPEN) 100 UNIT/ML FlexPen  . insulin glargine (LANTUS) 100 unit/mL SOPN  . ipratropium-albuterol (DUONEB) 0.5-2.5 (3) MG/3ML SOLN  . levothyroxine (SYNTHROID) 125 MCG tablet  . lisinopril-hydrochlorothiazide (PRINZIDE,ZESTORETIC) 20-25 MG tablet  . Magnesium 400 MG TABS  . montelukast (SINGULAIR) 10 MG tablet  . MUCINEX 600 MG 12 hr tablet  . naproxen sodium (ALEVE) 220 MG tablet  . nitroGLYCERIN (NITROSTAT) 0.4 MG SL tablet  . OVER THE COUNTER MEDICATION  . pregabalin (LYRICA) 150 MG capsule  . terconazole (TERAZOL 7) 0.4 % vaginal cream  . topiramate (TOPAMAX) 25 MG tablet  . Vitamin D, Ergocalciferol, (DRISDOL) 1.25 MG (50000 UT) CAPS capsule   No current facility-administered medications for this encounter.  - By medication list, she takes CBD oil 300 mg Q HS as needed for sleep; She is not currently taking Duoneb. Phentermine stopped at 01/25/20 visit with endocrinology.    Shonna Chock, [name] Surgical Short Stay/Anesthesiology [facility] Phone [phone] [facility] Phone [phone] 02/02/2020 6:34 PM

## 2020-02-03 MED ORDER — DEXTROSE 5 % IV SOLN
3.0000 g | INTRAVENOUS | Status: AC
  Administered 2020-02-04: 3 g via INTRAVENOUS
  Filled 2020-02-03: qty 3

## 2020-02-04 ENCOUNTER — Ambulatory Visit (HOSPITAL_COMMUNITY): Payer: Medicare HMO | Admitting: Vascular Surgery

## 2020-02-04 ENCOUNTER — Ambulatory Visit (HOSPITAL_COMMUNITY)
Admission: RE | Admit: 2020-02-04 | Discharge: 2020-02-04 | Disposition: A | Discharge status: Home / Self Care | Payer: Medicare HMO | Attending: Orthopedic Surgery | Admitting: Orthopedic Surgery

## 2020-02-04 ENCOUNTER — Encounter (HOSPITAL_COMMUNITY): Payer: Self-pay | Admitting: Orthopedic Surgery

## 2020-02-04 ENCOUNTER — Encounter (HOSPITAL_COMMUNITY)
Admission: RE | Disposition: A | Discharge status: Home / Self Care | Payer: Self-pay | Source: Home / Self Care | Attending: Orthopedic Surgery

## 2020-02-04 DIAGNOSIS — G2581 Restless legs syndrome: Secondary | ICD-10-CM | POA: Diagnosis not present

## 2020-02-04 DIAGNOSIS — Z7951 Long term (current) use of inhaled steroids: Secondary | ICD-10-CM | POA: Insufficient documentation

## 2020-02-04 DIAGNOSIS — E785 Hyperlipidemia, unspecified: Secondary | ICD-10-CM | POA: Insufficient documentation

## 2020-02-04 DIAGNOSIS — Z888 Allergy status to other drugs, medicaments and biological substances status: Secondary | ICD-10-CM | POA: Insufficient documentation

## 2020-02-04 DIAGNOSIS — Z6841 Body Mass Index (BMI) 40.0 and over, adult: Secondary | ICD-10-CM | POA: Insufficient documentation

## 2020-02-04 DIAGNOSIS — D649 Anemia, unspecified: Secondary | ICD-10-CM | POA: Insufficient documentation

## 2020-02-04 DIAGNOSIS — M797 Fibromyalgia: Secondary | ICD-10-CM | POA: Insufficient documentation

## 2020-02-04 DIAGNOSIS — Z9049 Acquired absence of other specified parts of digestive tract: Secondary | ICD-10-CM | POA: Insufficient documentation

## 2020-02-04 DIAGNOSIS — E039 Hypothyroidism, unspecified: Secondary | ICD-10-CM | POA: Insufficient documentation

## 2020-02-04 DIAGNOSIS — Z794 Long term (current) use of insulin: Secondary | ICD-10-CM | POA: Insufficient documentation

## 2020-02-04 DIAGNOSIS — J449 Chronic obstructive pulmonary disease, unspecified: Secondary | ICD-10-CM | POA: Insufficient documentation

## 2020-02-04 DIAGNOSIS — E119 Type 2 diabetes mellitus without complications: Secondary | ICD-10-CM | POA: Insufficient documentation

## 2020-02-04 DIAGNOSIS — X58XXXA Exposure to other specified factors, initial encounter: Secondary | ICD-10-CM | POA: Insufficient documentation

## 2020-02-04 DIAGNOSIS — Y939 Activity, unspecified: Secondary | ICD-10-CM | POA: Diagnosis not present

## 2020-02-04 DIAGNOSIS — K760 Fatty (change of) liver, not elsewhere classified: Secondary | ICD-10-CM | POA: Diagnosis not present

## 2020-02-04 DIAGNOSIS — K279 Peptic ulcer, site unspecified, unspecified as acute or chronic, without hemorrhage or perforation: Secondary | ICD-10-CM | POA: Insufficient documentation

## 2020-02-04 DIAGNOSIS — Z7982 Long term (current) use of aspirin: Secondary | ICD-10-CM | POA: Insufficient documentation

## 2020-02-04 DIAGNOSIS — K589 Irritable bowel syndrome without diarrhea: Secondary | ICD-10-CM | POA: Insufficient documentation

## 2020-02-04 DIAGNOSIS — M3500 Sicca syndrome, unspecified: Secondary | ICD-10-CM | POA: Insufficient documentation

## 2020-02-04 DIAGNOSIS — I10 Essential (primary) hypertension: Secondary | ICD-10-CM | POA: Insufficient documentation

## 2020-02-04 DIAGNOSIS — I2510 Atherosclerotic heart disease of native coronary artery without angina pectoris: Secondary | ICD-10-CM | POA: Insufficient documentation

## 2020-02-04 DIAGNOSIS — G5602 Carpal tunnel syndrome, left upper limb: Secondary | ICD-10-CM | POA: Insufficient documentation

## 2020-02-04 DIAGNOSIS — Z8249 Family history of ischemic heart disease and other diseases of the circulatory system: Secondary | ICD-10-CM | POA: Insufficient documentation

## 2020-02-04 DIAGNOSIS — Z87891 Personal history of nicotine dependence: Secondary | ICD-10-CM | POA: Insufficient documentation

## 2020-02-04 DIAGNOSIS — S63417A Traumatic rupture of collateral ligament of left little finger at metacarpophalangeal and interphalangeal joint, initial encounter: Secondary | ICD-10-CM | POA: Diagnosis not present

## 2020-02-04 DIAGNOSIS — F329 Major depressive disorder, single episode, unspecified: Secondary | ICD-10-CM | POA: Diagnosis not present

## 2020-02-04 DIAGNOSIS — M1990 Unspecified osteoarthritis, unspecified site: Secondary | ICD-10-CM | POA: Diagnosis not present

## 2020-02-04 DIAGNOSIS — Z79899 Other long term (current) drug therapy: Secondary | ICD-10-CM | POA: Insufficient documentation

## 2020-02-04 DIAGNOSIS — K219 Gastro-esophageal reflux disease without esophagitis: Secondary | ICD-10-CM | POA: Insufficient documentation

## 2020-02-04 DIAGNOSIS — G4730 Sleep apnea, unspecified: Secondary | ICD-10-CM | POA: Insufficient documentation

## 2020-02-04 DIAGNOSIS — F1120 Opioid dependence, uncomplicated: Secondary | ICD-10-CM | POA: Diagnosis not present

## 2020-02-04 DIAGNOSIS — Z881 Allergy status to other antibiotic agents status: Secondary | ICD-10-CM | POA: Insufficient documentation

## 2020-02-04 DIAGNOSIS — R519 Headache, unspecified: Secondary | ICD-10-CM | POA: Diagnosis not present

## 2020-02-04 HISTORY — PX: STERIOD INJECTION: SHX5046

## 2020-02-04 HISTORY — PX: LIGAMENT REPAIR: SHX5444

## 2020-02-04 LAB — GLUCOSE, CAPILLARY
Glucose-Capillary: 206 mg/dL — ABNORMAL HIGH (ref 70–99)
Glucose-Capillary: 161 mg/dL — ABNORMAL HIGH (ref 70–99)

## 2020-02-04 SURGERY — STEROID INJECTION
Anesthesia: Monitor Anesthesia Care | Site: Hand | Laterality: Left

## 2020-02-04 MED ORDER — FENTANYL CITRATE (PF) 250 MCG/5ML IJ SOLN
INTRAMUSCULAR | Status: AC
  Filled 2020-02-04: qty 5

## 2020-02-04 MED ORDER — CHLORHEXIDINE GLUCONATE 0.12 % MT SOLN: 15.0000 mL | Freq: Once | OROMUCOSAL | Status: AC

## 2020-02-04 MED ORDER — ONDANSETRON HCL 4 MG/2ML IJ SOLN
INTRAMUSCULAR | Status: DC | PRN
  Administered 2020-02-04: 4 mg via INTRAVENOUS

## 2020-02-04 MED ORDER — SODIUM CHLORIDE 0.9 % IV SOLN
INTRAVENOUS | Status: AC
  Filled 2020-02-04: qty 500000

## 2020-02-04 MED ORDER — BUPIVACAINE HCL (PF) 0.25 % IJ SOLN
INTRAMUSCULAR | Status: AC
  Filled 2020-02-04: qty 30

## 2020-02-04 MED ORDER — PROMETHAZINE HCL 25 MG/ML IJ SOLN: 6.2500 mg | INTRAMUSCULAR | Status: DC | PRN

## 2020-02-04 MED ORDER — PROPOFOL 10 MG/ML IV BOLUS
INTRAVENOUS | Status: AC
  Filled 2020-02-04: qty 20

## 2020-02-04 MED ORDER — OXYCODONE HCL 5 MG/5ML PO SOLN: 5.0000 mg | Freq: Once | ORAL | Status: DC | PRN

## 2020-02-04 MED ORDER — LIDOCAINE HCL (PF) 1 % IJ SOLN
INTRAMUSCULAR | Status: DC | PRN
  Administered 2020-02-04: .25 mL

## 2020-02-04 MED ORDER — ORAL CARE MOUTH RINSE: 15.0000 mL | Freq: Once | OROMUCOSAL | Status: AC

## 2020-02-04 MED ORDER — 0.9 % SODIUM CHLORIDE (POUR BTL) OPTIME
TOPICAL | Status: DC | PRN
  Administered 2020-02-04: 1000 mL

## 2020-02-04 MED ORDER — PROPOFOL 500 MG/50ML IV EMUL
INTRAVENOUS | Status: DC | PRN
  Administered 2020-02-04: 100 ug/kg/min via INTRAVENOUS

## 2020-02-04 MED ORDER — CHLORHEXIDINE GLUCONATE 0.12 % MT SOLN
OROMUCOSAL | Status: AC
  Administered 2020-02-04: 15 mL via OROMUCOSAL
  Filled 2020-02-04: qty 15

## 2020-02-04 MED ORDER — MIDAZOLAM HCL 5 MG/5ML IJ SOLN
INTRAMUSCULAR | Status: DC | PRN
  Administered 2020-02-04: 2 mg via INTRAVENOUS

## 2020-02-04 MED ORDER — LACTATED RINGERS IV SOLN: INTRAVENOUS | Status: DC

## 2020-02-04 MED ORDER — BUPIVACAINE-EPINEPHRINE (PF) 0.5% -1:200000 IJ SOLN
INTRAMUSCULAR | Status: DC | PRN
  Administered 2020-02-04: 30 mL via PERINEURAL

## 2020-02-04 MED ORDER — ONDANSETRON HCL 4 MG/2ML IJ SOLN
INTRAMUSCULAR | Status: AC
  Filled 2020-02-04: qty 2

## 2020-02-04 MED ORDER — LIDOCAINE HCL (PF) 1 % IJ SOLN
INTRAMUSCULAR | Status: AC
  Filled 2020-02-04: qty 30

## 2020-02-04 MED ORDER — METHYLPREDNISOLONE ACETATE 40 MG/ML IJ SUSP
INTRAMUSCULAR | Status: AC
  Filled 2020-02-04: qty 1

## 2020-02-04 MED ORDER — FENTANYL CITRATE (PF) 100 MCG/2ML IJ SOLN
INTRAMUSCULAR | Status: AC
Start: 2020-02-04 — End: 2020-02-04
  Administered 2020-02-04: 50 ug via INTRAVENOUS
  Filled 2020-02-04: qty 2

## 2020-02-04 MED ORDER — FENTANYL CITRATE (PF) 100 MCG/2ML IJ SOLN: 50.0000 ug | Freq: Once | INTRAMUSCULAR | Status: AC

## 2020-02-04 MED ORDER — OXYCODONE HCL 5 MG PO TABS: 5.0000 mg | ORAL_TABLET | Freq: Once | ORAL | Status: DC | PRN

## 2020-02-04 MED ORDER — MIDAZOLAM HCL 2 MG/2ML IJ SOLN
INTRAMUSCULAR | Status: AC
  Administered 2020-02-04: 0.5 mg via INTRAVENOUS
  Filled 2020-02-04: qty 2

## 2020-02-04 MED ORDER — METHYLPREDNISOLONE ACETATE 40 MG/ML IJ SUSP
INTRAMUSCULAR | Status: DC | PRN
  Administered 2020-02-04: .25 mL

## 2020-02-04 MED ORDER — PROPOFOL 10 MG/ML IV BOLUS
INTRAVENOUS | Status: DC | PRN
  Administered 2020-02-04: 20 mg via INTRAVENOUS

## 2020-02-04 MED ORDER — MIDAZOLAM HCL 2 MG/2ML IJ SOLN: 0.5000 mg | Freq: Once | INTRAMUSCULAR | Status: AC

## 2020-02-04 MED ORDER — FENTANYL CITRATE (PF) 100 MCG/2ML IJ SOLN: 25.0000 ug | INTRAMUSCULAR | Status: DC | PRN

## 2020-02-04 MED ORDER — MIDAZOLAM HCL 2 MG/2ML IJ SOLN
INTRAMUSCULAR | Status: AC
  Filled 2020-02-04: qty 2

## 2020-02-04 MED ORDER — PHENYLEPHRINE 40 MCG/ML (10ML) SYRINGE FOR IV PUSH (FOR BLOOD PRESSURE SUPPORT)
PREFILLED_SYRINGE | INTRAVENOUS | Status: AC
  Filled 2020-02-04: qty 10

## 2020-02-04 SURGICAL SUPPLY — 62 items
ANCH SUT 2-0 MN NDL DRL PLSTR (Anchor) ×1 IMPLANT
ANCHOR JUGGERKNOT 1.0 1DR 2-0 (Anchor) ×1 IMPLANT
APL PRP STRL LF DISP 70% ISPRP (MISCELLANEOUS) ×1
BAND INSRT 18 STRL LF DISP RB (MISCELLANEOUS) ×1
BAND RUBBER #18 3X1/16 STRL (MISCELLANEOUS) ×2 IMPLANT
BLADE MINI RND TIP GREEN BEAV (BLADE) IMPLANT
BNDG CMPR 9X4 STRL LF SNTH (GAUZE/BANDAGES/DRESSINGS) ×1
BNDG COHESIVE 1X5 TAN STRL LF (GAUZE/BANDAGES/DRESSINGS) IMPLANT
BNDG COHESIVE 2X5 TAN STRL LF (GAUZE/BANDAGES/DRESSINGS) IMPLANT
BNDG COHESIVE 3X5 TAN STRL LF (GAUZE/BANDAGES/DRESSINGS) ×2 IMPLANT
BNDG ESMARK 4X9 LF (GAUZE/BANDAGES/DRESSINGS) ×2 IMPLANT
BNDG GAUZE ELAST 4 BULKY (GAUZE/BANDAGES/DRESSINGS) ×2 IMPLANT
CAP PIN PROTECTOR ORTHO WHT (CAP) ×1 IMPLANT
CHLORAPREP W/TINT 26 (MISCELLANEOUS) ×2 IMPLANT
CORD BIPOLAR FORCEPS 12FT (ELECTRODE) ×2 IMPLANT
COVER SURGICAL LIGHT HANDLE (MISCELLANEOUS) IMPLANT
COVER WAND RF STERILE (DRAPES) ×2 IMPLANT
CUFF TOURN SGL QUICK 18X4 (TOURNIQUET CUFF) ×2 IMPLANT
CUFF TOURN SGL QUICK 24 (TOURNIQUET CUFF) ×2
CUFF TRNQT CYL 24X4X16.5-23 (TOURNIQUET CUFF) IMPLANT
DECANTER SPIKE VIAL GLASS SM (MISCELLANEOUS) IMPLANT
DRSG KUZMA FLUFF (GAUZE/BANDAGES/DRESSINGS) IMPLANT
DRSG XEROFORM 1X8 (GAUZE/BANDAGES/DRESSINGS) ×1 IMPLANT
DURAPREP 26ML APPLICATOR (WOUND CARE) ×2 IMPLANT
GAUZE SPONGE 2X2 8PLY STRL LF (GAUZE/BANDAGES/DRESSINGS) IMPLANT
GAUZE SPONGE 4X4 12PLY STRL (GAUZE/BANDAGES/DRESSINGS) ×1 IMPLANT
GAUZE XEROFORM 1X8 LF (GAUZE/BANDAGES/DRESSINGS) ×2 IMPLANT
GLOVE BIO SURGEON STRL SZ7.5 (GLOVE) ×2 IMPLANT
GLOVE BIOGEL PI IND STRL 8 (GLOVE) ×1 IMPLANT
GLOVE BIOGEL PI INDICATOR 8 (GLOVE) ×1
GLOVE SURG ORTHO 8.0 STRL STRW (GLOVE) ×2 IMPLANT
GOWN STRL REUS W/ TWL LRG LVL3 (GOWN DISPOSABLE) ×3 IMPLANT
GOWN STRL REUS W/ TWL XL LVL3 (GOWN DISPOSABLE) ×1 IMPLANT
GOWN STRL REUS W/TWL LRG LVL3 (GOWN DISPOSABLE) ×6
GOWN STRL REUS W/TWL XL LVL3 (GOWN DISPOSABLE) ×2
K-WIRE 0.9X102 (Wire) ×2 IMPLANT
KIT BASIN OR (CUSTOM PROCEDURE TRAY) ×2 IMPLANT
KIT TURNOVER KIT B (KITS) ×2 IMPLANT
KWIRE 0.9X102 (Wire) IMPLANT
MANIFOLD NEPTUNE II (INSTRUMENTS) ×2 IMPLANT
NDL HYPO 25GX1X1/2 BEV (NEEDLE) IMPLANT
NEEDLE HYPO 25GX1X1/2 BEV (NEEDLE) ×2 IMPLANT
NS IRRIG 1000ML POUR BTL (IV SOLUTION) ×2 IMPLANT
PACK ORTHO EXTREMITY (CUSTOM PROCEDURE TRAY) ×2 IMPLANT
PAD ARMBOARD 7.5X6 YLW CONV (MISCELLANEOUS) ×4 IMPLANT
PAD CAST 4YDX4 CTTN HI CHSV (CAST SUPPLIES) IMPLANT
PADDING CAST COTTON 4X4 STRL (CAST SUPPLIES) ×2
SPECIMEN JAR SMALL (MISCELLANEOUS) ×2 IMPLANT
SPONGE GAUZE 2X2 STER 10/PKG (GAUZE/BANDAGES/DRESSINGS)
SUT CHROMIC 5 0 P 3 (SUTURE) ×1 IMPLANT
SUT ETHILON 4 0 PS 2 18 (SUTURE) ×2 IMPLANT
SUT ETHILON 5 0 PS 2 18 (SUTURE) IMPLANT
SUT MERSILENE 4 0 P 3 (SUTURE) ×1 IMPLANT
SUT SILK 4 0 PS 2 (SUTURE) IMPLANT
SUT VICRYL 4-0 PS2 18IN ABS (SUTURE) IMPLANT
SYR CONTROL 10ML LL (SYRINGE) IMPLANT
SYR TB 1ML LUER SLIP (SYRINGE) ×1 IMPLANT
TOWEL GREEN STERILE (TOWEL DISPOSABLE) ×2 IMPLANT
TOWEL GREEN STERILE FF (TOWEL DISPOSABLE) ×2 IMPLANT
TUBE FEEDING ENTERAL 5FR 16IN (TUBING) IMPLANT
UNDERPAD 30X36 HEAVY ABSORB (UNDERPADS AND DIAPERS) ×2 IMPLANT
WATER STERILE IRR 1000ML POUR (IV SOLUTION) ×2 IMPLANT

## 2020-02-04 NOTE — Discharge Instructions (Addendum)

## 2020-02-04 NOTE — Brief Op Note (Signed)
02/04/2020  9:45 AM  PATIENT:  Marie Chen  59 y.o. female  PRE-OPERATIVE DIAGNOSIS:  RUPTURE RADIAL COLLATERAL LIGAMENT METACARPAL PHALANGEAL LEFT SMALL FINGER LEFT CARPAL TUNNEL SYNDROME  POST-OPERATIVE DIAGNOSIS:  RUPTURE RADIAL COLLATERAL LIGAMENT METACARPAL PHALANGEAL LEFT SMALL FINGER LEFT CARPAL TUNNEL SYNDROME  PROCEDURE:  Procedure(s) with comments: INJECTION LEFT CARPAL TUNNEL (Left) REPAIR RADIAL COLLATERAL LIGAMENT METACARPAL PHALANGEAL LEFT SMALL FINGER; POSSIBLE PINNING (Left) - AXILLARY BLOCK  SURGEON:  Surgeon(s) and Role:    * Cindee Salt, MD - Primary    * Betha Loa, MD  PHYSICIAN ASSISTANT:   ASSISTANTS: K Addie Alonge,MD   ANESTHESIA:   regional sedation  EBL:  2 mL   BLOOD ADMINISTERED:none  DRAINS: none   LOCAL MEDICATIONS USED:  NONE  SPECIMEN:  No Specimen  DISPOSITION OF SPECIMEN:  N/A  COUNTS:  YES  TOURNIQUET:   Total Tourniquet Time Documented: Upper Arm (Left) - 30 minutes Total: Upper Arm (Left) - 30 minutes   DICTATION: .Dragon Dictation  PLAN OF CARE: Discharge to home after PACU  PATIENT DISPOSITION:  PACU - hemodynamically stable.

## 2020-02-04 NOTE — Anesthesia Postprocedure Evaluation (Signed)
Anesthesia Post Note  Patient: Marie Chen  Procedure(s) Performed: INJECTION LEFT CARPAL TUNNEL (Left Hand) REPAIR RADIAL COLLATERAL LIGAMENT METACARPAL PHALANGEAL LEFT SMALL FINGER; POSSIBLE PINNING (Left Hand)     Patient location during evaluation: PACU Anesthesia Type: Regional Level of consciousness: awake and alert Pain management: pain level controlled Vital Signs Assessment: post-procedure vital signs reviewed and stable Respiratory status: spontaneous breathing, nonlabored ventilation and respiratory function stable Cardiovascular status: stable and blood pressure returned to baseline Anesthetic complications: no   No complications documented.  Last Vitals:  Vitals:   02/04/20 1014 02/04/20 1045  BP: (!) 154/94 124/81  Pulse: 86 85  Resp: 13 18  Temp:  (!) 36.3 C  SpO2: 92% 95%    Last Pain:  Vitals:   02/04/20 0958  TempSrc:   PainSc: 0-No pain                 Beryle Lathe
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
## 2020-02-04 NOTE — Op Note (Signed)
I assisted Surgeon(s) and Role:    * Cindee Salt, MD - Primary    Betha Loa, MD on the Procedure(s): INJECTION LEFT CARPAL TUNNEL REPAIR RADIAL COLLATERAL LIGAMENT METACARPAL PHALANGEAL LEFT SMALL FINGER; POSSIBLE PINNING on 02/04/2020.  I provided assistance on this case as follows: retraction soft tissues, placement anchor.  Electronically signed by: Betha Loa, MD Date: 02/04/2020 Time: 9:45 AM

## 2020-02-04 NOTE — Transfer of Care (Signed)
Immediate Anesthesia Transfer of Care Note  Patient: Marie Chen  Procedure(s) Performed: INJECTION LEFT CARPAL TUNNEL (Left Hand) REPAIR RADIAL COLLATERAL LIGAMENT METACARPAL PHALANGEAL LEFT SMALL FINGER; POSSIBLE PINNING (Left Hand)  Patient Location: PACU  Anesthesia Type:MAC combined with regional for post-op pain  Level of Consciousness: awake, alert  and oriented  Airway & Oxygen Therapy: Patient Spontanous Breathing and Patient connected to nasal cannula oxygen  Post-op Assessment: Report given to RN, Post -op Vital signs reviewed and stable and Patient moving all extremities X 4  Post vital signs: Reviewed and stable  Last Vitals:  Vitals Value Taken Time  BP 131/63 02/04/20 0958  Temp 36.6 C 02/04/20 0958  Pulse 91 02/04/20 1000  Resp 16 02/04/20 1000  SpO2 95 % 02/04/20 1000  Vitals shown include unvalidated device data.  Last Pain:  Vitals:   02/04/20 0958  TempSrc:   PainSc: (P) 0-No pain      Patients Stated Pain Goal: 2 (68/15/94 7076)  Complications: No complications documented.

## 2020-02-04 NOTE — Anesthesia Procedure Notes (Signed)
Procedure Name: MAC Date/Time: 02/04/2020 8:45 AM Performed by: Inda Coke, CRNA Pre-anesthesia Checklist: Patient identified, Emergency Drugs available, Suction available, Timeout performed and Patient being monitored Patient Re-evaluated:Patient Re-evaluated prior to induction Oxygen Delivery Method: Simple face mask Induction Type: IV induction Dental Injury: Teeth and Oropharynx as per pre-operative assessment

## 2020-02-04 NOTE — Progress Notes (Signed)
Orthopedic Tech Progress Note Patient Details:  Marie Chen August 03, 1960 [id number]  Ortho Devices Type of Ortho Device: Arm sling Ortho Device/Splint Location: LUE Ortho Device/Splint Interventions: Ordered, Application, Adjustment   Post Interventions Patient Tolerated: Well Instructions Provided: Care of device, Adjustment of device, Poper ambulation with device   Orson Rho 02/04/2020, 11:36 AM

## 2020-02-04 NOTE — Anesthesia Procedure Notes (Signed)
Anesthesia Regional Block: Axillary brachial plexus block   Pre-Anesthetic Checklist: ,, timeout performed, Correct Patient, Correct Site, Correct Laterality, Correct Procedure, Correct Position, site marked, Risks and benefits discussed,  Surgical consent,  Pre-op evaluation,  At surgeon's request and post-op pain management  Laterality: Left  Prep: chloraprep       Needles:  Injection technique: Single-shot  Needle Type: Echogenic Needle     Needle Length: 10cm  Needle Gauge: 21     Additional Needles:   Narrative:  Start time: 02/04/2020 8:19 AM End time: 02/04/2020 8:23 AM Injection made incrementally with aspirations every 5 mL.  Performed by: Personally  Anesthesiologist: Beryle Lathe, MD  Additional Notes: No pain on injection. No increased resistance to injection. Injection made in 5cc increments. Good needle visualization. Patient tolerated the procedure well.

## 2020-02-04 NOTE — H&P (Signed)
Marie Chen is an 59 y.o. female.   Chief Complaint: left hand pain HPI: Marie Chen is a 59 year old female with a history of triggering of her left small finger. She was seen by Bluford Main. She had an injection to the A1 pulley at that time. States that the injection gave her little relief she is complaining of some discomfort going up her forearm volar aspect. Her complaint is primarily at the metacarpal phalangeal joint left small finger. she continues to complain of triggering small finger left hand and questionable radial collateral ligament injury with instability. She was  referred for arthrographic MRI of the metacarpal phalangeal joint. This has been done and read out by Dr. Renold Don to a focal perforation of the radial collateral ligament on the dorsal surface of the radial side. There is surrounding soft tissue edema. There is an intrasubstance sprain through the remaining ligament. She continues to complain of instability to the metacarpal phalangeal joint to the small finger left hand .She is also complaining of numbness and tingling to the ring and small. She was also complaining of numbness and tingling to the ring and small finger. She was referred for nerve conductions which have done by Dr. Neale Burly. This reveals a carpal tunnel syndrome but no evidence of ulnar neuropathy. She has had an MRI done to her finger revealing a focal defect in the dorsal surface of the radial side collateral ligament with surrounding edema at the metacarpal phalangeal joint. She has had injections to both the joint and the A1 pulley of the small finger.  Past Medical History:  Diagnosis Date  . Asthma    severe  . COPD (chronic obstructive pulmonary disease) (HCC)    patient denies this dx  . Coronary artery disease   . Depression   . Diabetes mellitus without complication (HCC)    type 2  . Dyspnea    with exertion  . Fatty liver   . Fibromyalgia   . GERD (gastroesophageal reflux disease)   .  HLD (hyperlipidemia)    diet controlled  . Hypertension   . Hypothyroidism   . Migraine   . Sjogren's disease (HCC)   . Sleep apnea    CPAP nightly  . Thyroid disease     Past Surgical History:  Procedure Laterality Date  . ANKLE SURGERY  2010  . BACK SURGERY    . CARPAL TUNNEL RELEASE Bilateral   . CHOLECYSTECTOMY    . COLONOSCOPY  04/2014  . FOOT SURGERY     bunion surgery bialteral  . HYSTEROSCOPY    . LEFT HEART CATH AND CORONARY ANGIOGRAPHY N/A 12/19/2018   Procedure: LEFT HEART CATH AND CORONARY ANGIOGRAPHY;  Surgeon: Yvonne Kendall, MD;  Location: [facility] INVASIVE [facility] LAB;  Service: Cardiovascular;  Laterality: N/A;  . RESECTION DISTAL CLAVICAL Right   . TUBAL LIGATION    . UPPER GI ENDOSCOPY  04/2014  . WISDOM TOOTH EXTRACTION      Family History  Problem Relation Age of Onset  . Heart failure Father   . Colon cancer Neg Hx    Social History:  reports that she quit smoking about 7 months ago. Her smoking use included cigarettes. She has never used smokeless tobacco. She reports that she does not drink alcohol and does not use drugs.  Allergies:  Allergies  Allergen Reactions  . Macrodantin [Nitrofurantoin Macrocrystal] Rash  . Metformin And Related Other (See Comments)    Causes yeast infections  . Atorvastatin Other (See Comments)  . Hydromorphone  Hcl Nausea And Vomiting  . Lipitor [Atorvastatin Calcium] Diarrhea  . Meperidine Hcl Nausea Only    Is ok if given with Phenergan  . Tetracyclines & Related Rash    Medications Prior to Admission  Medication Sig Dispense Refill  . albuterol (PROVENTIL) (2.5 MG/3ML) 0.083% nebulizer solution Take 2.5 mg by nebulization every 6 (six) hours as needed for wheezing or shortness of breath.     Marland Kitchen albuterol (VENTOLIN HFA) 108 (90 Base) MCG/ACT inhaler Inhale 1-2 puffs into the lungs every 6 (six) hours as needed for wheezing or shortness of breath.    Marland Kitchen aspirin EC 81 MG tablet Take 1 tablet (81 mg total) by mouth daily. 90  tablet 3  . calcium carbonate (TUMS - DOSED IN MG ELEMENTAL CALCIUM) 500 MG chewable tablet Chew 2 tablets by mouth daily as needed for indigestion or heartburn.    . Carboxymethylcellul-Glycerin (LUBRICATING EYE DROPS OP) Place 1 drop into both eyes daily as needed (dry eyes).    Marland Kitchen escitalopram (LEXAPRO) 20 MG tablet Take 20 mg by mouth at bedtime.     . fluticasone (FLONASE) 50 MCG/ACT nasal spray Place 1 spray into both nostrils daily as needed for allergies.     . Fluticasone-Umeclidin-Vilant (TRELEGY ELLIPTA) 100-62.5-25 MCG/INH AEPB Inhale 1 puff into the lungs daily.     Marland Kitchen HYDROcodone-acetaminophen (NORCO) 10-325 MG per tablet Take 1 tablet by mouth 3 (three) times daily as needed for moderate pain.     Marland Kitchen insulin aspart (NOVOLOG FLEXPEN) 100 UNIT/ML FlexPen Inject 18 Units into the skin 3 (three) times daily with meals. Max daily 70 units to include correction scale 60 mL 3  . insulin glargine (LANTUS) 100 unit/mL SOPN Inject 80 Units into the skin daily before breakfast.     . levothyroxine (SYNTHROID) 125 MCG tablet Take 1 tablet (125 mcg total) by mouth daily. 90 tablet 3  . lisinopril-hydrochlorothiazide (PRINZIDE,ZESTORETIC) 20-25 MG tablet Take 1 tablet by mouth daily with breakfast. 30 tablet 0  . Magnesium 400 MG TABS Take 400 mg by mouth daily.     . montelukast (SINGULAIR) 10 MG tablet Take 10 mg by mouth daily with breakfast.    . MUCINEX 600 MG 12 hr tablet Take 600 mg by mouth 2 (two) times daily as needed.     . naproxen sodium (ALEVE) 220 MG tablet Take 440 mg by mouth daily as needed (pain).    . nitroGLYCERIN (NITROSTAT) 0.4 MG SL tablet Place 1 tablet (0.4 mg total) under the tongue every 5 (five) minutes as needed. 25 tablet 3  . OVER THE COUNTER MEDICATION Take 300 mg by mouth at bedtime as needed (sleep). CBD oil     . pregabalin (LYRICA) 150 MG capsule Take 150 mg by mouth 3 (three) times daily.    Marland Kitchen terconazole (TERAZOL 7) 0.4 % vaginal cream Place 1 applicator  vaginally at bedtime as needed (yeast infections).     . topiramate (TOPAMAX) 25 MG tablet Take 25 mg by mouth at bedtime.    . Vitamin D, Ergocalciferol, (DRISDOL) 1.25 MG (50000 UT) CAPS capsule Take 50,000 Units by mouth every Saturday.    Marland Kitchen ACCU-CHEK AVIVA PLUS test strip     . ipratropium-albuterol (DUONEB) 0.5-2.5 (3) MG/3ML SOLN Take 3 mLs by nebulization every 6 (six) hours as needed (shortness of breath/wheezing). (Patient not taking: Reported on 01/27/2020)      No results found for this or any previous visit (from the past 48 hour(s)).  No results  found.   Pertinent items are noted in HPI.  There were no vitals taken for this visit.  General appearance: alert, cooperative and appears stated age Head: Normocephalic, without obvious abnormality Neck: no JVD Resp: clear to auscultation bilaterally Cardio: regular rate and rhythm, S1, S2 normal, no murmur, click, rub or gallop GI: soft, non-tender; bowel sounds normal; no masses,  no organomegaly Extremities: pain MCP left small finger Pulses: 2+ and symmetric Skin: Skin color, texture, turgor normal. No rashes or lesions Neurologic: Grossly normal Incision/Wound: na  Assessment/Plan  Assessment:  1. Numbness  2. Carpal tunnel syndrome of left wrist  3. Trigger finger, left little finger  4. Old disruption of left lateral collateral ligament    Plan: We have discussed her conductions with her. She does not appear to have an ulnar neuropathy as such would not recommend any treatment from a surgical standpoint to the ulnar nerve she would like to have the collateral ligament repaired. Prepare postoperative course are discussed along with risk and complications. Would not recommend release of the A1 pulley at the same time and that if it requires pinning of the joint for stabilization this would be counterproductive to release of the A1 pulley. Would recommend an injection of the carpal canal at the time of the surgery to  protect her against swelling and increase in her symptoms. Would not recommend decompression and that she is relatively asymptomatic with this. She would like to proceed and is scheduled for repair of her radial collateral ligament off the metacarpal head right small finger with injection of the carpal canal. This will be scheduled as an outpatient under regional anesthesia. She is aware there is no guarantee to the surgery the possibility of an infection recurrence injury to arteries and nerves tendons incomplete relief symptoms just possibility of stiffness of the finger. She would like to proceed.    Cindee Salt 02/04/2020, 6:24 AM

## 2020-02-05 ENCOUNTER — Encounter (HOSPITAL_COMMUNITY): Payer: Self-pay | Admitting: Orthopedic Surgery

## 2020-03-23 ENCOUNTER — Other Ambulatory Visit: Payer: Self-pay | Admitting: Internal Medicine

## 2020-04-14 ENCOUNTER — Telehealth: Payer: Self-pay | Admitting: Internal Medicine

## 2020-04-14 NOTE — Telephone Encounter (Signed)
[employer] called and is requesting that the RX for the Novolog Flexpen be resent to them.  They do not show having received the RX as of yet.  [facility] - [place], [place] - [address]  [address] [place] [address]  Phone:  [phone] Fax:  [phone]

## 2020-04-15 MED ORDER — NOVOLOG FLEXPEN 100 UNIT/ML ~~LOC~~ SOPN: 18.0000 [IU] | PEN_INJECTOR | Freq: Three times a day (TID) | SUBCUTANEOUS | 3 refills | Status: DC

## 2020-04-15 NOTE — Telephone Encounter (Signed)
Spoken to [facility] pharmacy, verified direction are correct  and resent Rx as requested.

## 2020-04-22 ENCOUNTER — Other Ambulatory Visit: Payer: Self-pay | Admitting: Internal Medicine

## 2020-04-22 MED ORDER — ACCU-CHEK AVIVA PLUS VI STRP: ORAL_STRIP | 2 refills | Status: DC

## 2020-04-22 NOTE — Telephone Encounter (Signed)
REFILL REQUEST FOR:  accu-chek aviva plus test strips 90 day supply [facility] - [place], [place] - [address] Phone:  [phone]  Fax:  [phone]

## 2020-04-22 NOTE — Telephone Encounter (Signed)
Refill sent.

## 2020-05-26 ENCOUNTER — Ambulatory Visit: Payer: Medicare HMO | Admitting: Internal Medicine

## 2020-05-26 ENCOUNTER — Other Ambulatory Visit: Payer: Self-pay

## 2020-05-26 ENCOUNTER — Encounter: Payer: Self-pay | Admitting: Internal Medicine

## 2020-05-26 VITALS — BP 130/80 | HR 82 | Ht 68.0 in | Wt 318.5 lb

## 2020-05-26 DIAGNOSIS — E1142 Type 2 diabetes mellitus with diabetic polyneuropathy: Secondary | ICD-10-CM | POA: Diagnosis not present

## 2020-05-26 DIAGNOSIS — E785 Hyperlipidemia, unspecified: Secondary | ICD-10-CM

## 2020-05-26 DIAGNOSIS — Z794 Long term (current) use of insulin: Secondary | ICD-10-CM

## 2020-05-26 DIAGNOSIS — E890 Postprocedural hypothyroidism: Secondary | ICD-10-CM | POA: Diagnosis not present

## 2020-05-26 DIAGNOSIS — E669 Obesity, unspecified: Secondary | ICD-10-CM | POA: Diagnosis not present

## 2020-05-26 DIAGNOSIS — E1169 Type 2 diabetes mellitus with other specified complication: Secondary | ICD-10-CM | POA: Diagnosis not present

## 2020-05-26 LAB — LIPID PANEL
Cholesterol: 171 mg/dL (ref 0–200)
HDL: 38.9 mg/dL — ABNORMAL LOW (ref >39.00)
LDL Cholesterol: 114 mg/dL — ABNORMAL HIGH (ref 0–99)
NonHDL: 131.66
Total CHOL/HDL Ratio: 4
Triglycerides: 88 mg/dL (ref 0.0–149.0)
VLDL: 17.6 mg/dL (ref 0.0–40.0)

## 2020-05-26 LAB — MICROALBUMIN / CREATININE URINE RATIO
Creatinine,U: 55.8 mg/dL
Microalb Creat Ratio: 1.3 mg/g (ref 0.0–30.0)
Microalb, Ur: <0.7 mg/dL (ref 0.0–1.9)

## 2020-05-26 LAB — POCT GLYCOSYLATED HEMOGLOBIN (HGB A1C): Hemoglobin A1C: 6 % — AB (ref 4.0–5.6)

## 2020-05-26 LAB — TSH: TSH: 0.47 u[IU]/mL (ref 0.35–4.50)

## 2020-05-26 MED ORDER — DEXCOM G6 SENSOR MISC: 1.0000 | 11 refills | Status: DC

## 2020-05-26 MED ORDER — DEXCOM G6 TRANSMITTER MISC: 1.0000 | 3 refills | Status: DC

## 2020-05-26 NOTE — Progress Notes (Signed)
Name: Marie Chen  Age/ Sex: 59 y.o., female   MRN/ DOB: [id number], 01/06/1961     [facility]P: [facility]   Reason for Endocrinology Evaluation: Type 2 Diabetes Mellitus  Initial Endocrine Consultative Visit: 10/19/2019    PATIENT IDENTIFIER: Marie Chen is a 59 y.o. female with a past medical history of DM, COPD,sjogren's syndrome , OSA on BiPAP, CAD,  Dyslipidemia and pancreatitis. The patient has followed with Endocrinology clinic since 10/19/2019 for consultative assistance with management of her diabetes.  DIABETIC HISTORY:  Marie Chen was diagnosed with DM in 2015. Pt endorses genital skin irritation secondary to Metformin ?Marcelline Deist. Farxiga caused genital infections. Her hemoglobin A1c has ranged from 8.8% in 03/2019 , peaking at 10.5% in 2021.  On her initial visit to  Our clinic her A1c 9.0% . We adjust MDI regimen     THYROID HISTORY:  In 1994 she had RAI ablation secondary to hyperthyroidism. She needed LT-4 replacement shortly after RAI ablation .    Mother with cushing syndrome   SUBJECTIVE:   During the last visit (01/25/2020): A1c 8.0% . Adjusted MDI regimen      Today (05/26/2020): Marie Chen is here for a follow up on diabetes management.  She checks her blood sugars 2-3 times daily, preprandial. The patient has not had hypoglycemic episodes since the last clinic visit.    She is scheduled for weight loss sx  Spring 2022   She is S/P left carpal tunnel injection and radial ligament repair 01/2020  She is having cough, productive of green phlegm, denies fever     HOME ENDOCRINE REGIMEN:  Lantus 80 units daily  Novolog 18 units TIDQAC  CF : Novolog (BG-130/25)  Levothyroxine 125 mcg daily     Statin: yes ACE-I/ARB: yes    METER DOWNLOAD SUMMARY: Date range evaluated: 12/2-12/16/2021 Average Number Tests/Day = 2.4 Overall Mean FS Glucose = 124   BG Ranges: Low = 74 High = 295   Hypoglycemic Events/30 Days: BG < 50 =  0 Episodes of symptomatic severe hypoglycemia = 0    DIABETIC COMPLICATIONS: Microvascular complications:   Neuropathy   Denies: CKD, retinopathy   Last eye exam: Completed 01/26/2020  Macrovascular complications:   CAD ( medically treated per pt)   Denies: CAD, PVD, CVA   HISTORY:  Past Medical History:  Past Medical History:  Diagnosis Date   Asthma    severe   COPD (chronic obstructive pulmonary disease) (HCC)    patient denies this dx   Coronary artery disease    Depression    Diabetes mellitus without complication (HCC)    type 2   Dyspnea    with exertion   Fatty liver    Fibromyalgia    GERD (gastroesophageal reflux disease)    HLD (hyperlipidemia)    diet controlled   Hypertension    Hypothyroidism    Migraine    Sjogren's disease (HCC)    Sleep apnea    CPAP nightly   Thyroid disease    Past Surgical History:  Past Surgical History:  Procedure Laterality Date   ANKLE SURGERY  2010   BACK SURGERY     CARPAL TUNNEL RELEASE Bilateral    CHOLECYSTECTOMY     COLONOSCOPY  04/2014   FOOT SURGERY     bunion surgery bialteral   HYSTEROSCOPY     LEFT HEART CATH AND CORONARY ANGIOGRAPHY N/A 12/19/2018   Procedure: LEFT HEART CATH AND CORONARY ANGIOGRAPHY;  Surgeon: Yvonne KendallEnd, Christopher, MD;  Location: [facility];  Service: Cardiovascular;  Laterality: N/A;   LIGAMENT REPAIR Left 02/04/2020   Procedure: REPAIR RADIAL COLLATERAL LIGAMENT METACARPAL PHALANGEAL LEFT SMALL FINGER; POSSIBLE PINNING;  Surgeon: Cindee Salt, MD;  Location: [facility] OR;  Service: Orthopedics;  Laterality: Left;  AXILLARY BLOCK   RESECTION DISTAL CLAVICAL Right    STERIOD INJECTION Left 02/04/2020   Procedure: INJECTION LEFT CARPAL TUNNEL;  Surgeon: Cindee Salt, MD;  Location: [facility] OR;  Service: Orthopedics;  Laterality: Left;   TUBAL LIGATION     UPPER GI ENDOSCOPY  04/2014   WISDOM TOOTH EXTRACTION      Social History:  reports that she quit smoking  about a year ago. Her smoking use included cigarettes. She has never used smokeless tobacco. She reports that she does not drink alcohol and does not use drugs. Family History:  Family History  Problem Relation Age of Onset   Heart failure Father    Colon cancer Neg Hx      HOME MEDICATIONS: Allergies as of 05/26/2020      Reactions   Macrodantin [nitrofurantoin Macrocrystal] Rash   Metformin And Related Other (See Comments)   Causes yeast infections   Atorvastatin Other (See Comments)   Hydromorphone Hcl Nausea And Vomiting   Lipitor [atorvastatin Calcium] Diarrhea   Meperidine Hcl Nausea Only   Is ok if given with Phenergan   Tetracyclines & Related Rash      Medication List       Accurate as of May 26, 2020  1:00 PM. If you have any questions, ask your nurse or doctor.        STOP taking these medications   HYDROcodone-acetaminophen 10-325 MG tablet Commonly known as: NORCO Stopped by: Scarlette Shorts, MD     TAKE these medications   Accu-Chek Aviva Plus test strip Generic drug: glucose blood Use as instructed to test blood sugar up to 3 times daily   albuterol 108 (90 Base) MCG/ACT inhaler Commonly known as: VENTOLIN HFA Inhale 1-2 puffs into the lungs every 6 (six) hours as needed for wheezing or shortness of breath.   albuterol (2.5 MG/3ML) 0.083% nebulizer solution Commonly known as: PROVENTIL Take 2.5 mg by nebulization every 6 (six) hours as needed for wheezing or shortness of breath.   aspirin EC 81 MG tablet Take 1 tablet (81 mg total) by mouth daily.   calcium carbonate 500 MG chewable tablet Commonly known as: TUMS - dosed in mg elemental calcium Chew 2 tablets by mouth daily as needed for indigestion or heartburn.   Dexcom G6 Sensor Misc 1 Device by Does not apply route as directed. Started by: Scarlette Shorts, MD   Dexcom G6 Transmitter Misc 1 Device by Does not apply route as directed. Started by: Scarlette Shorts,  MD   escitalopram 20 MG tablet Commonly known as: LEXAPRO Take 20 mg by mouth at bedtime.   fluticasone 50 MCG/ACT nasal spray Commonly known as: FLONASE Place 1 spray into both nostrils daily as needed for allergies.   insulin glargine 100 unit/mL Sopn Commonly known as: LANTUS Inject 80 Units into the skin daily before breakfast.   levothyroxine 125 MCG tablet Commonly known as: SYNTHROID Take 1 tablet (125 mcg total) by mouth daily.   lisinopril-hydrochlorothiazide 20-25 MG tablet Commonly known as: ZESTORETIC Take 1 tablet by mouth daily with breakfast.   LUBRICATING EYE DROPS OP Place 1 drop into both eyes daily as needed (dry eyes).   Magnesium 400 MG Tabs Take  400 mg by mouth daily.   meloxicam 7.5 MG tablet Commonly known as: MOBIC Take by mouth.   montelukast 10 MG tablet Commonly known as: SINGULAIR Take 10 mg by mouth daily with breakfast.   Mucinex 600 MG 12 hr tablet Generic drug: guaiFENesin Take 600 mg by mouth 2 (two) times daily as needed.   naproxen sodium 220 MG tablet Commonly known as: ALEVE Take 440 mg by mouth daily as needed (pain).   nitroGLYCERIN 0.4 MG SL tablet Commonly known as: NITROSTAT Place 1 tablet (0.4 mg total) under the tongue every 5 (five) minutes as needed.   NovoLOG FlexPen 100 UNIT/ML FlexPen Generic drug: insulin aspart Inject 18 Units into the skin 3 (three) times daily with meals. Max daily 90 units   OVER THE COUNTER MEDICATION Take 300 mg by mouth at bedtime as needed (sleep). CBD oil   oxyCODONE-acetaminophen 7.5-325 MG tablet Commonly known as: PERCOCET TAKE 1 TABLET BY MOUTH EVERY 6 HOURS AS NEEDED FOR CHRONIC PAIN. MAX 4 PER DAY   pregabalin 150 MG capsule Commonly known as: LYRICA Take 150 mg by mouth 3 (three) times daily.   terconazole 0.4 % vaginal cream Commonly known as: TERAZOL 7 Place 1 applicator vaginally at bedtime as needed (yeast infections).   topiramate 25 MG tablet Commonly known as:  TOPAMAX Take 25 mg by mouth at bedtime.   Trelegy Ellipta 100-62.5-25 MCG/INH Aepb Generic drug: Fluticasone-Umeclidin-Vilant Inhale 1 puff into the lungs daily.   Vitamin D (Ergocalciferol) 1.25 MG (50000 UNIT) Caps capsule Commonly known as: DRISDOL Take 50,000 Units by mouth every Saturday.        OBJECTIVE:   Vital Signs: BP 130/80    Pulse 82    Ht 5\' 8"  (1.727 m)    Wt (!) 318 lb 8 oz (144.5 kg)    LMP  (LMP Unknown)    SpO2 94%    BMI 48.43 kg/m   Wt Readings from Last 3 Encounters:  05/26/20 (!) 318 lb 8 oz (144.5 kg)  02/04/20 (!) 327 lb (148.3 kg)  02/01/20 (!) 327 lb 9.6 oz (148.6 kg)     Exam: General: Pt appears well and is in NAD  Lungs: Clear with good BS bilat with no rales, rhonchi, or wheezes  Heart: RRR with normal S1 and S2 and no gallops; no murmurs; no rub  Extremities: No pretibial edema.   Neuro: MS is good with appropriate affect, pt is alert and Ox3    DM foot exam: 10/19/2019 The skin of the feet is without sores or ulcerations but has thickened toe nails and plantar formation noted B/L  The pedal pulses are 2+ on right and 2+ on left. The sensation is decreased  to a screening 5.07, 10 gram monofilament bilaterally   DATA REVIEWED:  Lab Results  Component Value Date   HGBA1C 6.0 (A) 05/26/2020   HGBA1C 8.0 (A) 01/25/2020   HGBA1C 9.0 (A) 10/19/2019   Lab Results  Component Value Date   MICROALBUR 0.7 03/14/2009   LDLCALC 123 (H) 01/07/2019   CREATININE 0.65 02/01/2020   Lab Results  Component Value Date   MICRALBCREAT 2.3 03/14/2009     Lab Results  Component Value Date   CHOL 179 01/07/2019   HDL 34 (L) 01/07/2019   LDLCALC 123 (H) 01/07/2019   LDLDIRECT 179.5 03/14/2009   TRIG 112 01/07/2019   CHOLHDL 6 03/14/2009        07/01/2019  BUN/Cr 13/0.62 GFR 98   03/25/2019 Tg  140 LDL 101 A1c 8.8 %  Results for ANNI, HOCEVAR (MRN [id number]) as of 01/26/2020 19:22  Ref. Range 01/25/2020 10:50  TSH Latest Ref  Range: 0.35 - 4.50 uIU/mL 1.19  T4,Free(Direct) Latest Ref Range: 0.60 - 1.60 ng/dL 4.00     ASSESSMENT / PLAN / RECOMMENDATIONS:   1) Type 2 Diabetes Mellitus, Optimally controlled, With Neuropathic  complications - Most recent A1c of 6.0 %. Goal A1c < 7.0 %.    - I have praised the pt on optimizing glucose control  - She is planning on having weight loss surgery by the spring of 2022 - Unfortunately due to reported personal history of pancreatitis , DPP-4 inhibitors and GLP-1 agonists are CONTRAINDICATED - She is intolerant to metformin and SGLT-2 inhibitors.  - With a low A1c, and to prevent hypoglycemia, will reduce reduce basal insulin as below  - Will prescribe Dexcom    MEDICATIONS:  Decrease Lantus 72 units daily   Continue  Novolog 18 units daily   CF: Novolog ( BG- 130/25)   EDUCATION / INSTRUCTIONS:  BG monitoring instructions: Patient is instructed to check her blood sugars 3 times a day, before meals .  Call [facility] if: BG persistently < 70   I reviewed the Rule of 15 for the treatment of hypoglycemia in detail with the patient. Literature supplied.    2) Diabetic complications:   Eye: Does not have known diabetic retinopathy.   Neuro/ Feet: Does have known diabetic peripheral neuropathy.  Renal: Patient does not have known baseline CKD. She is on an ACEI/ARB at present.Check urine albumin/creatinine ratio yearly starting at time of diagnosis.      3) Postablative Hypothyroidism :  - Pt is clinically euthyroid  - No local neck symptoms  Medication : Levothyroxine 125 mcg daily     4) Dyslipidemia:   - She is intolerant to Atorvastatin, Simvastatin, Rosuvastatin and Zetia with Myalgias    Medication   Repatha 140 mg [place] Q 2 weeks      F/U in 4 months    Signed electronically by: Lyndle Herrlich, MD  [facility] End[facility]  [facility] [address] [place] [place], [place]  [address] Phone: [phone] FAX: [phone]   CC: [facility] [address] [place] [address] Phone: [phone]  Fax: [phone]  Return to Endocrinology clinic as below: Future Appointments  Date Time Provider Department Center  07/25/2020  8:20 AM Revankar, Aundra Dubin, MD [facility] None  09/28/2020 11:10 AM Charo Philipp, Konrad Dolores, MD [facility] None

## 2020-05-26 NOTE — Patient Instructions (Addendum)
-   Decrease Lantus  72 units DAILY  - Continue Novolog 18 units with EACH meal  - Novolog correctional insulin: ADD extra units on insulin to your meal-time Novolog dose if your blood sugars are higher than 155. Use the scale below to help guide you:   Blood sugar before meal Number of units to inject  Less than 155 0 unit  156 -  180 1 units  181 -  205 2 units  206 -  230 3 units  231-  255 4 units  256 -  280 5 units  281 -  305 6 units  306 -  330 7 units  331-  355 8 units          HOW TO TREAT LOW BLOOD SUGARS (Blood sugar LESS THAN 70 MG/DL)  Please follow the RULE OF 15 for the treatment of hypoglycemia treatment (when your (blood sugars are less than 70 mg/dL)    STEP 1: Take 15 grams of carbohydrates when your blood sugar is low, which includes:   3-4 GLUCOSE TABS  OR  3-4 OZ OF JUICE OR REGULAR SODA OR  ONE TUBE OF GLUCOSE GEL     STEP 2: RECHECK blood sugar in 15 MINUTES STEP 3: If your blood sugar is still low at the 15 minute recheck --> then, go back to STEP 1 and treat AGAIN with another 15 grams of carbohydrates.

## 2020-05-27 MED ORDER — REPATHA 140 MG/ML ~~LOC~~ SOSY: 140.0000 mg | PREFILLED_SYRINGE | SUBCUTANEOUS | 11 refills | Status: DC

## 2020-06-13 ENCOUNTER — Telehealth: Payer: Self-pay | Admitting: Internal Medicine

## 2020-06-13 NOTE — Telephone Encounter (Signed)
Spoken to patient and notified Dr Harvel Ricks comments. However, patient stated that she already knows how to give herself the injection. She started last week.  Patient asked about Dexcom sensors and transmitter. Will submitted prior authorization as requested.

## 2020-06-13 NOTE — Telephone Encounter (Signed)
Please let the pt know that her insurance apporved the repatha.    Please schedule her to a " Nurse Visit" to train her on the injection. Its 140 mg every 2 weeks    Thanks   Abby Raelyn Mora, MD  [facility] Endocrinology  [facility] [address] [place], [place] [address] Phone: [phone] FAX: [phone]

## 2020-07-04 IMAGING — MR MR [PERSON_NAME]*[PERSON_NAME]* W/CM
9 series · 40 of 40 positions shown · IV contrast (agent unspecified)
Comparison: None.

CLINICAL DATA: Left pinky finger pain and numbness, question of
collateral ligament injury.

EXAM:
MRI OF THE LEFT FINGERS WITH CONTRAST
TECHNIQUE: Multiplanar, multisequence MR imaging of the left was performed
following the administration of intravenous contrast.
CONTRAST:  Intra-articular contrast

[Series 5: T1 fat-sat · coronal · left · 2.0mm · 0.31mm/px · 3 of 13 slices shown (1 of 5)]
[im 1/13]
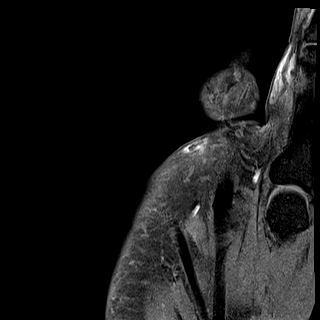
[im 7/13]
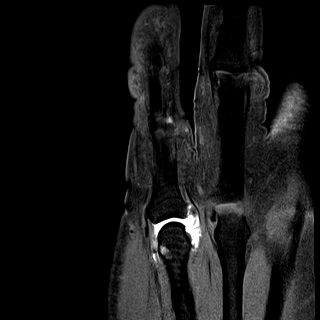
[im 13/13]
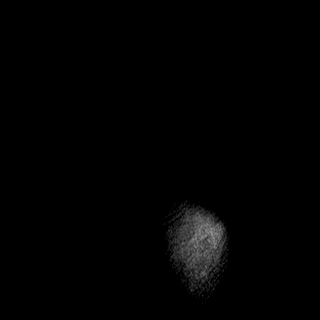

[Series 6: T2 fat-sat · coronal · left · 2.0mm · 0.31mm/px · 3 of 13 slices shown (1 of 3)]
[im 1/13]
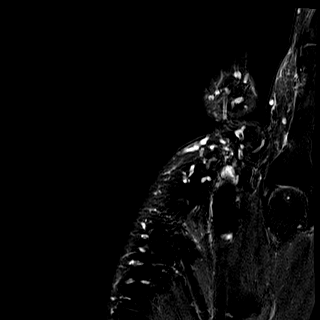
[im 7/13]
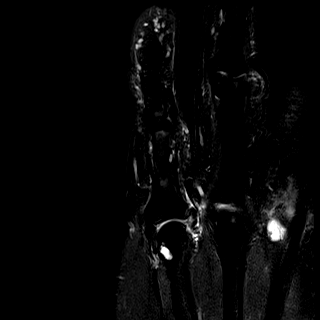
[im 13/13]
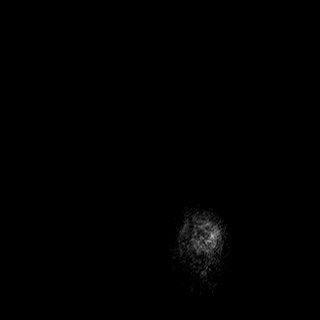

[Series 7: T1 fat-sat · axial · left · 3.0mm · 0.31mm/px · z∈[+3,+91]mm · 7 of 31 slices shown (2 of 5)]
[im 1/31]
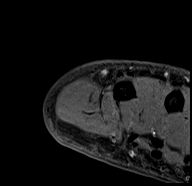
[im 6/31]
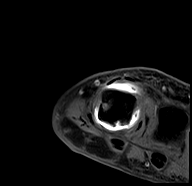
[im 11/31]
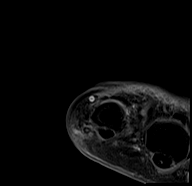
[im 16/31]
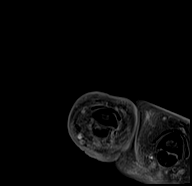
[im 21/31]
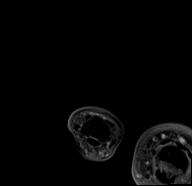
[im 26/31]
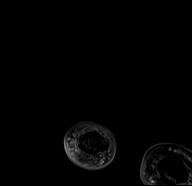
[im 31/31]
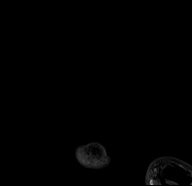

[Series 8: T2 fat-sat · axial · left · 3.0mm · 0.17mm/px · z∈[+1,+88]mm · 7 of 31 slices shown (2 of 3)]
[im 1/31]
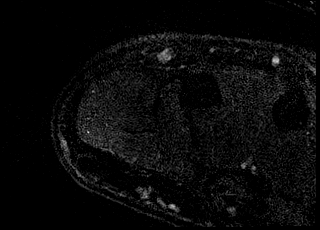
[im 6/31]
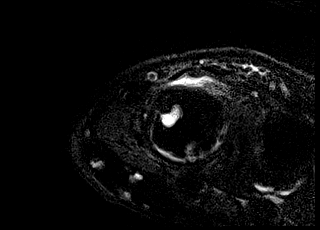
[im 11/31]
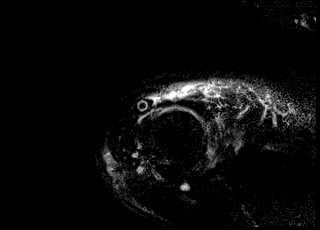
[im 16/31]
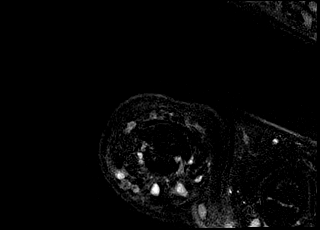
[im 21/31]
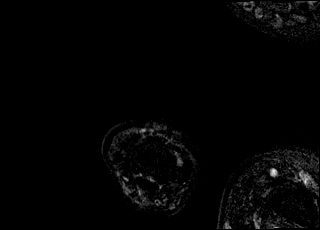
[im 26/31]
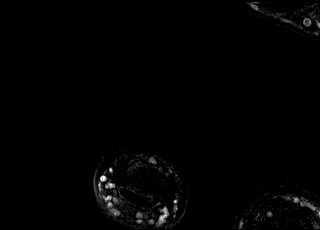
[im 31/31]
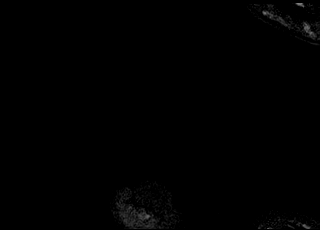

[Series 9: T1 fat-sat · sagittal · left · 2.0mm · 0.31mm/px · 2 of 11 slices shown (3 of 5)]
[im 1/11]
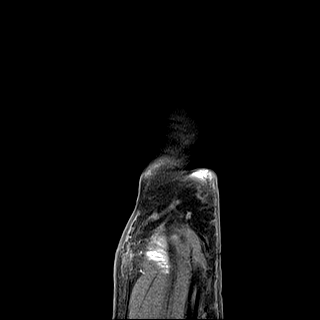
[im 11/11]
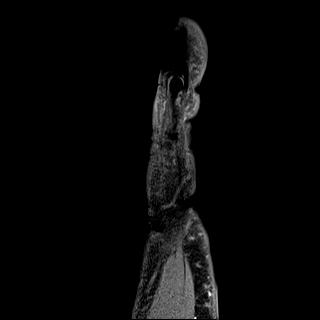

[Series 10: PD fat-sat · sagittal · left · 2.0mm · 0.31mm/px · 2 of 11 slices shown]
[im 1/11]
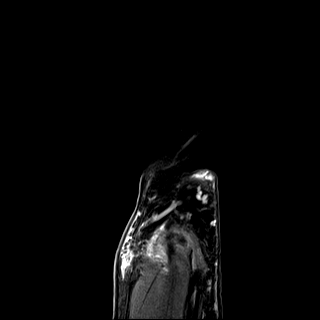
[im 11/11]
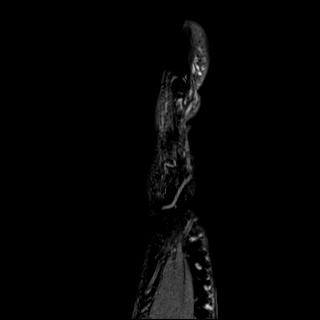

[Series 11: T2 fat-sat · coronal · left · 1.0mm · 0.31mm/px · 6 of 27 slices shown (3 of 3)]
[im 1/27]
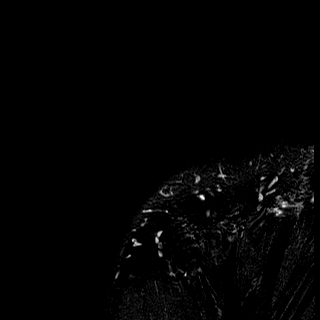
[im 6/27]
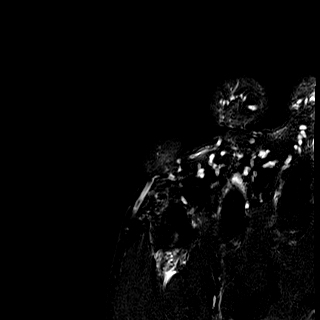
[im 11/27]
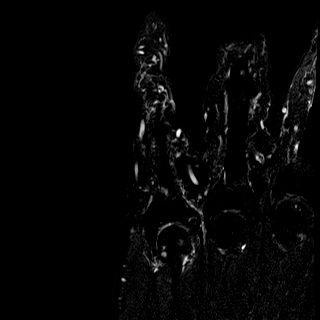
[im 16/27]
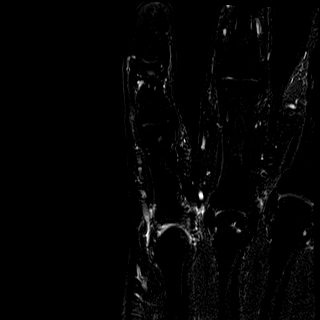
[im 21/27]
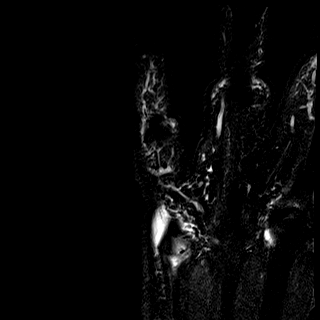
[im 27/27]
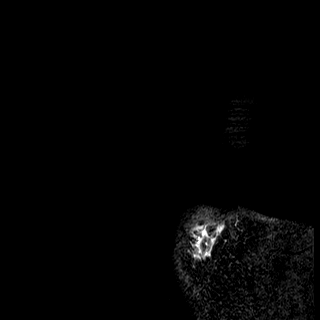

[Series 12: T1 fat-sat · axial · left · 1.0mm · 0.31mm/px · z∈[+2,+34]mm · 7 of 33 slices shown (4 of 5)]
[im 1/33]
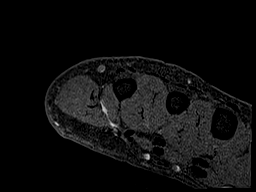
[im 6/33]
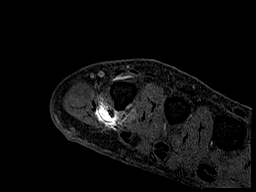
[im 11/33]
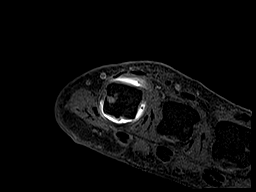
[im 17/33]
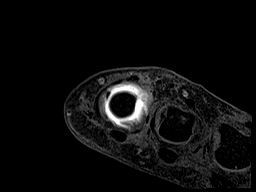
[im 22/33]
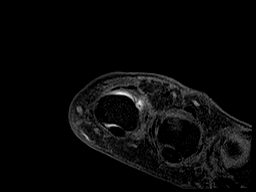
[im 27/33]
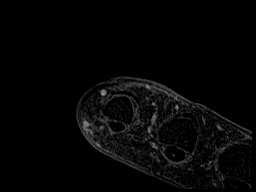
[im 33/33]
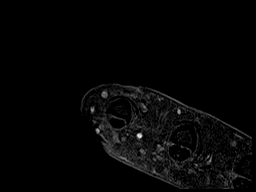

[Series 13: T1 fat-sat · coronal · left · 2.0mm · 0.31mm/px · 3 of 13 slices shown (5 of 5)]
[im 1/13]
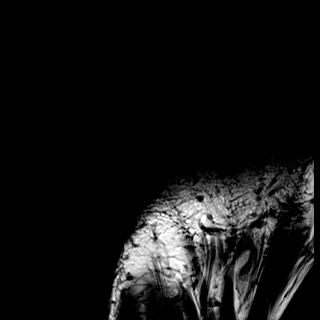
[im 7/13]
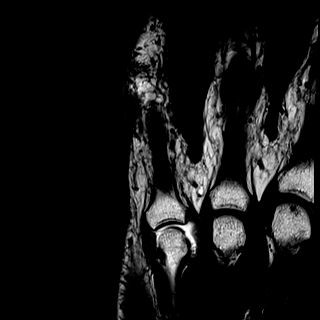
[im 13/13]
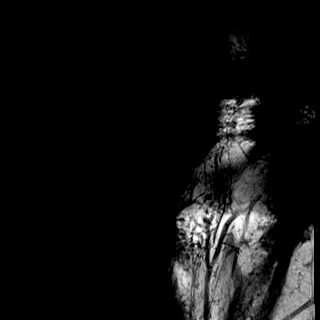

[40 of 40 positions shown; findings below may reference images not displayed]

FINDINGS: Bones/Joint/Cartilage

Cystic changes are seen at the lateral corner of the fifth
metacarpal head. There is also small cystic changes seen at the
fourth metacarpal head. There is mild joint space loss seen at the
fifth MCP joint. No osseous fracture, marrow edema, or pathologic
marrow infiltration. There is adequate distention of the fifth MCP
joint with intra-articular contrast. No surrounding debris or joint
effusion.

Ligaments

There is a focal perforation of the dorsal radial collateral
ligament at the fifth metacarpal insertion site best seen on series
6, image 7. There is heterogeneous signal and thickening seen
throughout the remainder of the radial collateral ligament. The
ulnar collateral ligament is intact.

Muscles and Tendons

The muscles surrounding the fifth metacarpal are normal appearance
without focal atrophy or tear. The visualized portions of the
extensor and flexor tendons are intact.

Soft tissues

There is soft tissue edema and swelling seen around the dorsum of
the fifth MCP joint.
IMPRESSION: Focal perforation at the dorsal surface of the fifth MCP radial
collateral ligament with surrounding soft tissue edema. There is
intrasubstance sprain throughout the remainder of the radial
collateral ligament.

Mild fifth MCP joint osteoarthritis.

## 2020-07-04 IMAGING — XA DG FLUORO GUIDE NDL PLC/BX
2 series · 2 of 2 positions shown · non-contrast
Comparison: none

CLINICAL DATA: Suspected left small finger MCP joint collateral
ligament injury.

[Series 1: ortho standard · 1 of 1 slices shown (1 of 2)]
[im 1/1]
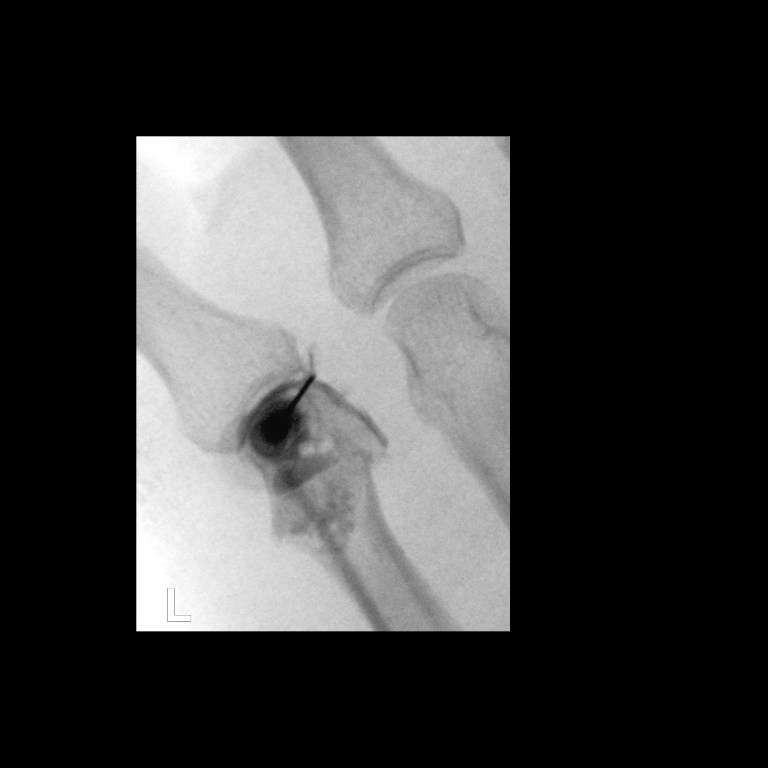

[Series 2: ortho standard · 1 of 1 slices shown (2 of 2)]
[im 1/1]
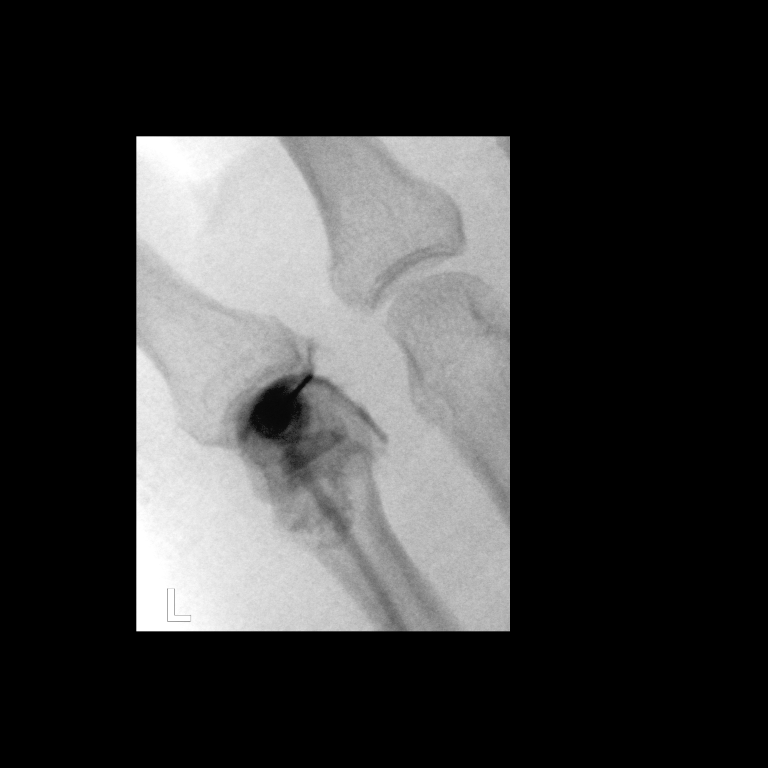

[2 of 2 positions shown; findings below may reference images not displayed]

FLUOROSCOPY TIME:  Radiation Exposure Index (as provided by the
fluoroscopic device): 0.2 mGy

Fluoroscopy Time:  17 seconds

Number of Acquired Images:  0

PROCEDURE:
The risks and benefits of the procedure were discussed with the
patient, and written informed consent was obtained. The patient
stated no history of allergy to contrast media. A formal timeout
procedure was performed with the patient according to departmental
protocol.

The patient was placed supine on the fluoroscopy table and the left
fifth MCP joint was identified under fluoroscopy. The skin overlying
the left fifth MCP joint was subsequently cleaned with Betadine and
a sterile drape was placed over the area of interest. 0.5 ml 1%
Lidocaine was used to anesthetize the skin around the needle
insertion site.

A 25 gauge needle was inserted into the left fifth MCP joint under
fluoroscopy.

1 ml of gadolinium mixture (0.1 ml of Multihance mixed with 10 ml of
Isovue-M 200 contrast and 10 ml of sterile saline) were injected
into the left fifth MCP joint.

The needle was removed and hemostasis was achieved. The patient was
subsequently transferred to MRI for imaging.
IMPRESSION: Technically successful left fifth MCP joint injection for MRI.

## 2020-07-13 ENCOUNTER — Encounter: Payer: Self-pay | Admitting: Orthopaedic Surgery

## 2020-07-13 ENCOUNTER — Other Ambulatory Visit: Payer: Self-pay

## 2020-07-13 ENCOUNTER — Ambulatory Visit (INDEPENDENT_AMBULATORY_CARE_PROVIDER_SITE_OTHER): Payer: Medicare HMO

## 2020-07-13 ENCOUNTER — Ambulatory Visit (INDEPENDENT_AMBULATORY_CARE_PROVIDER_SITE_OTHER): Payer: Medicare HMO | Admitting: Orthopaedic Surgery

## 2020-07-13 VITALS — Ht 68.0 in | Wt 310.0 lb

## 2020-07-13 DIAGNOSIS — G8929 Other chronic pain: Secondary | ICD-10-CM | POA: Diagnosis not present

## 2020-07-13 DIAGNOSIS — M25512 Pain in left shoulder: Secondary | ICD-10-CM

## 2020-07-13 HISTORY — DX: Pain in left shoulder: M25.512

## 2020-07-13 NOTE — Progress Notes (Signed)
Office Visit Note   Patient: Marie Chen           Date of Birth: Apr 10, 1961           MRN: [id number] Visit Date: 07/13/2020              Requested by: Healthcare, [facility] [address] [place],  [place] [address] PCP: Healthcare, [facility]   Assessment & Plan: Visit Diagnoses:  1. Chronic left shoulder pain     Plan: Mrs. Bancroft has been experiencing right shoulder pain for approximately 3 to 4 months.  No history of injury or trauma.  Having difficulty raising her arm over her head.  He does have a little bit of weakness but it could be just related to her pain.  There is also some discomfort over the anterior aspect of her shoulder near the supraspinatus attachment to the greater tuberosity.  There is evidence of a very small area of calcification in that area on plain film.  I am somewhat concerned that she may have a rotator cuff tear.  She is an insulin-dependent diabetic and I am hesitant to give her any cortisone.  Therefore I will order an MRI scan and have her return shortly thereafter.  Follow-Up Instructions: Return After MRI scan left shoulder.   Orders:  Orders Placed This Encounter  Procedures  . XR Shoulder Left  . MR Shoulder Left w/o contrast   No orders of the defined types were placed in this encounter.     Procedures: No procedures performed   Clinical Data: No additional findings.   Subjective: Chief Complaint  Patient presents with  . Left Shoulder - Pain  Patient presents today for her left shoulder. She said that it has been hurting for months now. No known injury. She said that it hurts more superiorly. Any use of that arm makes her pain worse. Range of motion causes more pain as well. She feels like her arm is weak. No numbness or tingling. No previous shoulder surgery. She is in pain management. She is right hand dominant.  Has been on disability since 2015 related to her obesity and her insulin-dependent diabetes mellitus.   She is also on a chronic pain management program for her low back pain, hip and knee pain.  She is taking Percocet 7.5 mg up to 4 times a day.  HPI  Review of Systems   Objective: Vital Signs: Ht 5\' 8"  (1.727 m)   Wt (!) 310 lb (140.6 kg)   LMP  (LMP Unknown)   BMI 47.14 kg/m   Physical Exam Constitutional:      Appearance: She is well-developed and well-nourished.  HENT:     Mouth/Throat:     Mouth: Oropharynx is clear and moist.  Eyes:     Extraocular Movements: EOM normal.     Pupils: Pupils are equal, round, and reactive to light.  Pulmonary:     Effort: Pulmonary effort is normal.  Skin:    General: Skin is warm and dry.  Neurological:     Mental Status: She is alert and oriented to person, place, and time.  Psychiatric:        Mood and Affect: Mood and affect normal.        Behavior: Behavior normal.     Ortho Exam BMI 47.  Awake alert and oriented x3 was able to raise her left arm overhead but with a circuitous arc of motion and with some discomfort.  Positive  impingement and empty can testing.  Negative Speed sign.  No crepitation.  Does have some pain along the anterior subacromial region and even along the lateral subacromial region.  Good grip and release.  With very large arms.  I think the biceps is intact.  No skin changes, ecchymosis or erythema.  No pain with range of motion of cervical spine Specialty Comments:  No specialty comments available.  Imaging: XR Shoulder Left  Result Date: 07/13/2020 Films of the left shoulder obtained in several projections.  There is slight inferior position of the humeral head relationship to the glenoid which may or may not be significant.  Some mild hypertrophic change about the distal clavicle but the [facility] joint appears to be clear.  Normal space between the humeral head and the acromion.  Very small amount of calcification near the supraspinatus attachment to the greater tuberosity could be consistent with calcific  tendinitis    PMFS History: Patient Active Problem List   Diagnosis Date Noted  . Pain in left shoulder 07/13/2020  . Type 2 diabetes mellitus with diabetic polyneuropathy, with long-term current use of insulin (HCC) 01/26/2020  . Preop cardiovascular exam 01/22/2020  . Type 2 diabetes mellitus with hyperglycemia, without long-term current use of insulin (HCC) 10/19/2019  . Dyslipidemia 10/19/2019  . COPD (chronic obstructive pulmonary disease) (HCC) 06/19/2019  . Hypertension   . GERD (gastroesophageal reflux disease)   . Depression   . Acute hypoxemic respiratory failure due to severe acute respiratory syndrome coronavirus 2 (SARS-CoV-2) disease (HCC) 06/18/2019  . Cigarette smoker 05/04/2019  . CAD (coronary artery disease) 01/01/2019  . Morbid obesity (HCC) 12/04/2018  . Diabetes mellitus type 2 in obese (HCC) 05/12/2018  . Community acquired pneumonia   . Cough with hemoptysis 05/11/2018  . Hemoptysis 05/11/2018  . Trochanteric bursitis of left hip 03/02/2018  . Pre-ulcerative corn or callous 01/30/2018  . Thoracic spine pain 11/13/2017  . Lumbar radiculopathy 09/19/2017  . Pain from implanted hardware 08/01/2017  . Plantar callus 08/01/2017  . Plantar fasciitis 08/01/2017  . Chronic pain syndrome 04/25/2017  . Lumbar facet arthropathy 04/25/2017  . Primary osteoarthritis of both hips 04/25/2017  . Primary osteoarthritis of both knees 04/25/2017  . Segmental dysfunction of lumbar region 04/25/2017  . ACUTE PHARYNGITIS 05/23/2009  . SINUSITIS- ACUTE-NOS 02/24/2009  . HEADACHE 02/24/2009  . Headache 02/24/2009  . ASTHMATIC BRONCHITIS, ACUTE 04/27/2008  . DYSPHAGIA 04/27/2008  . MIGRAINE, CHRONIC 04/22/2008  . BRONCHITIS, CHRONIC 04/22/2008  . PERSONAL HISTORY ENDOCRN METABOLIC&IMMUNITY D/O 04/22/2008  . Diabetes mellitus due to underlying condition with unspecified complications (HCC) 03/26/2007  . 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 ALLERGIC  RHINITIS 03/26/2007  . ASTHMA NOS W/ACUTE EXACERBATION 03/26/2007  . PEPTIC ULCER DISEASE 03/26/2007  . RESTLESS LEG SYNDROME, HX OF 03/26/2007  . IRRITABLE BOWEL SYNDROME, HX OF 03/26/2007  . Hypothyroidism 01/05/2007  . Mixed dyslipidemia 01/05/2007  . DEPRESSION 01/05/2007  . SLEEP APNEA, OBSTRUCTIVE 01/05/2007  . Essential hypertension 01/05/2007  . GERD 01/05/2007  . HAMMER TOE 01/05/2007   Past Medical History:  Diagnosis Date  . Asthma    severe  . COPD (chronic obstructive pulmonary disease) (HCC)    patient denies this dx  . Coronary artery disease   . Depression   . Diabetes mellitus without complication (HCC)    type 2  . Dyspnea    with exertion  . Fatty liver   . Fibromyalgia   . GERD (gastroesophageal reflux disease)   .  HLD (hyperlipidemia)    diet controlled  . Hypertension   . Hypothyroidism   . Migraine   . Sjogren's disease (HCC)   . Sleep apnea    CPAP nightly  . Thyroid disease     Family History  Problem Relation Age of Onset  . Heart failure Father   . Colon cancer Neg Hx     Past Surgical History:  Procedure Laterality Date  . ANKLE SURGERY  2010  . BACK SURGERY    . CARPAL TUNNEL RELEASE Bilateral   . CHOLECYSTECTOMY    . COLONOSCOPY  04/2014  . FOOT SURGERY     bunion surgery bialteral  . HYSTEROSCOPY    . LEFT HEART CATH AND CORONARY ANGIOGRAPHY N/A 12/19/2018   Procedure: LEFT HEART CATH AND CORONARY ANGIOGRAPHY;  Surgeon: Yvonne Kendall, MD;  Location: [facility] INVASIVE CV LAB;  Service: Cardiovascular;  Laterality: N/A;  . LIGAMENT REPAIR Left 02/04/2020   Procedure: REPAIR RADIAL COLLATERAL LIGAMENT METACARPAL PHALANGEAL LEFT SMALL FINGER; POSSIBLE PINNING;  Surgeon: Cindee Salt, MD;  Location: [facility] OR;  Service: Orthopedics;  Laterality: Left;  AXILLARY BLOCK  . RESECTION DISTAL CLAVICAL Right   . STERIOD INJECTION Left 02/04/2020   Procedure: INJECTION LEFT CARPAL TUNNEL;  Surgeon: Cindee Salt, MD;  Location: [facility] OR;  Service:  Orthopedics;  Laterality: Left;  . TUBAL LIGATION    . UPPER GI ENDOSCOPY  04/2014  . WISDOM TOOTH EXTRACTION     Social History   Occupational History  . Occupation: CSR    Employer: [employer]  Tobacco Use  . Smoking status: Former Smoker    Types: Cigarettes    Quit date: 06/15/2019    Years since quitting: 1.0  . Smokeless tobacco: Never Used  Vaping Use  . Vaping Use: Never used  Substance and Sexual Activity  . Alcohol use: No    Alcohol/week: 0.0 standard drinks  . Drug use: No  . Sexual activity: Yes    Birth control/protection: Post-menopausal

## 2020-07-15 ENCOUNTER — Other Ambulatory Visit: Payer: Self-pay

## 2020-07-15 ENCOUNTER — Ambulatory Visit
Admission: RE | Admit: 2020-07-15 | Discharge: 2020-07-15 | Disposition: A | Discharge status: Home / Self Care | Payer: Medicare HMO | Source: Ambulatory Visit | Attending: Orthopaedic Surgery | Admitting: Orthopaedic Surgery

## 2020-07-15 DIAGNOSIS — G8929 Other chronic pain: Secondary | ICD-10-CM

## 2020-07-18 ENCOUNTER — Telehealth: Payer: Self-pay | Admitting: Orthopaedic Surgery

## 2020-07-18 NOTE — Telephone Encounter (Signed)
Please see below.

## 2020-07-18 NOTE — Telephone Encounter (Signed)
Patient called requesting to have MRI results read over the phone. Pt did set appt to have results read but would prefer over the phone . Please call patient and let her know. Patient phone number is [phone].

## 2020-07-19 NOTE — Telephone Encounter (Signed)
Called results and discussed treatment optrions

## 2020-07-22 ENCOUNTER — Other Ambulatory Visit: Payer: Self-pay

## 2020-07-22 DIAGNOSIS — E079 Disorder of thyroid, unspecified: Secondary | ICD-10-CM | POA: Insufficient documentation

## 2020-07-22 DIAGNOSIS — E119 Type 2 diabetes mellitus without complications: Secondary | ICD-10-CM | POA: Insufficient documentation

## 2020-07-22 DIAGNOSIS — M3500 Sicca syndrome, unspecified: Secondary | ICD-10-CM | POA: Insufficient documentation

## 2020-07-22 DIAGNOSIS — I2510 Atherosclerotic heart disease of native coronary artery without angina pectoris: Secondary | ICD-10-CM | POA: Insufficient documentation

## 2020-07-22 DIAGNOSIS — R0600 Dyspnea, unspecified: Secondary | ICD-10-CM | POA: Insufficient documentation

## 2020-07-22 DIAGNOSIS — M797 Fibromyalgia: Secondary | ICD-10-CM | POA: Insufficient documentation

## 2020-07-22 DIAGNOSIS — J45909 Unspecified asthma, uncomplicated: Secondary | ICD-10-CM | POA: Insufficient documentation

## 2020-07-22 DIAGNOSIS — G4730 Sleep apnea, unspecified: Secondary | ICD-10-CM | POA: Insufficient documentation

## 2020-07-22 DIAGNOSIS — K760 Fatty (change of) liver, not elsewhere classified: Secondary | ICD-10-CM | POA: Insufficient documentation

## 2020-07-22 DIAGNOSIS — M1990 Unspecified osteoarthritis, unspecified site: Secondary | ICD-10-CM

## 2020-07-22 HISTORY — DX: Unspecified osteoarthritis, unspecified site: M19.90

## 2020-07-25 ENCOUNTER — Other Ambulatory Visit: Payer: Self-pay

## 2020-07-25 ENCOUNTER — Encounter: Payer: Self-pay | Admitting: Cardiology

## 2020-07-25 ENCOUNTER — Ambulatory Visit: Payer: Medicare HMO | Admitting: Cardiology

## 2020-07-25 VITALS — BP 132/78 | HR 66 | Ht 68.0 in | Wt 311.2 lb

## 2020-07-25 DIAGNOSIS — I2510 Atherosclerotic heart disease of native coronary artery without angina pectoris: Secondary | ICD-10-CM | POA: Diagnosis not present

## 2020-07-25 DIAGNOSIS — Z01810 Encounter for preprocedural cardiovascular examination: Secondary | ICD-10-CM

## 2020-07-25 DIAGNOSIS — I10 Essential (primary) hypertension: Secondary | ICD-10-CM

## 2020-07-25 DIAGNOSIS — G4733 Obstructive sleep apnea (adult) (pediatric): Secondary | ICD-10-CM

## 2020-07-25 DIAGNOSIS — E088 Diabetes mellitus due to underlying condition with unspecified complications: Secondary | ICD-10-CM

## 2020-07-25 DIAGNOSIS — E782 Mixed hyperlipidemia: Secondary | ICD-10-CM

## 2020-07-25 DIAGNOSIS — E119 Type 2 diabetes mellitus without complications: Secondary | ICD-10-CM | POA: Diagnosis not present

## 2020-07-25 HISTORY — DX: Encounter for preprocedural cardiovascular examination: Z01.810

## 2020-07-25 MED ORDER — METOPROLOL TARTRATE 100 MG PO TABS: 100.0000 mg | ORAL_TABLET | Freq: Once | ORAL | 0 refills | Status: DC

## 2020-07-25 NOTE — Patient Instructions (Signed)
Medication Instructions:  No medication changes. *If you need a refill on your cardiac medications before your next appointment, please call your pharmacy*   Lab Work: Your physician recommends that you have a BMET 1 week before your CT scan. You do not need an apportionment for this.  If you have labs (blood work) drawn today and your tests are completely normal, you will receive your results only by: Marland Kitchen MyChart Message (if you have MyChart) OR . A paper copy in the mail If you have any lab test that is abnormal or we need to change your treatment, we will call you to review the results.   Testing/Procedures: Your cardiac CT will be scheduled at:   [facility] [place], [place] [address] [phone]   If scheduled at [facility], please arrive at the [facility] main entrance of [facility] 30 minutes prior to test start time. Proceed to the [facility] Radiology Department (first floor) to check-in and test prep.  Please follow these instructions carefully (unless otherwise directed):  On the Night Before the Test: . Be sure to Drink plenty of water. . Do not consume any caffeinated/decaffeinated beverages or chocolate 12 hours prior to your test. . Do not take any antihistamines 12 hours prior to your test.   On the Day of the Test: . Drink plenty of water. Do not drink any water within one hour of the test. . Do not eat any food 4 hours prior to the test. . You may take your regular medications prior to the test.  . Take metoprolol (Lopressor) two hours prior to test. You will only take this if your heart rate is over 55. Marland Kitchen FEMALES- please wear underwire-free bra if available       After the Test: . Drink plenty of water. . After receiving IV contrast, you may experience a mild flushed feeling. This is normal. . On occasion, you may experience a mild rash up to 24 hours after the test. This is not dangerous. If this occurs,  you can take Benadryl 25 mg and increase your fluid intake. . If you experience trouble breathing, this can be serious. If it is severe call 911 IMMEDIATELY. If it is mild, please call our office. . If you take any of these medications: Glipizide/Metformin, Avandament, Glucavance, please do not take 48 hours after completing test unless otherwise instructed.   Once we have confirmed authorization from your insurance company, we will call you to set up a date and time for your test. Based on how quickly your insurance processes prior authorizations requests, please allow up to 4 weeks to be contacted for scheduling your Cardiac CT appointment. Be advised that routine Cardiac CT appointments could be scheduled as many as 8 weeks after your provider has ordered it.  For non-scheduling related questions, please contact the cardiac imaging nurse navigator should you have any questions/concerns: Marchia Bond, Cardiac Imaging Nurse Navigator Burley Saver, Interim Cardiac Imaging Nurse [place] [facility] Direct Office Dial: [phone]   For scheduling needs, including cancellations and rescheduling, please call Vivien Rota at [phone].     Follow-Up: At [facility], you and your health needs are our priority.  As part of our continuing mission to provide you with exceptional heart care, we have created designated Provider Care Teams.  These Care Teams include your primary Cardiologist (physician) and Advanced Practice Providers (APPs -  Physician Assistants and Nurse Practitioners) who all  work together to provide you with the care you need, when you need it.  We recommend signing up for the patient portal called "MyChart".  Sign up information is provided on this After Visit Summary.  MyChart is used to connect with patients for Virtual Visits (Telemedicine).  Patients are able to view lab/test results, encounter notes, upcoming appointments, etc.  Non-urgent messages can be  sent to your provider as well.   To learn more about what you can do with MyChart, go to [email or web address].    Your next appointment:   6 month(s)  The format for your next appointment:   In Person  Provider:   Jyl Heinz, MD   Other Instructions Cardiac CT Angiogram A cardiac CT angiogram is a procedure to look at the heart and the area around the heart. It may be done to help find the cause of chest pains or other symptoms of heart disease. During this procedure, a substance called contrast dye is injected into the blood vessels in the area to be checked. A large X-ray machine, called a CT scanner, then takes detailed pictures of the heart and the surrounding area. The procedure is also sometimes called a coronary CT angiogram, coronary artery scanning, or CTA. A cardiac CT angiogram allows the health care provider to see how well blood is flowing to and from the heart. The health care provider will be able to see if there are any problems, such as:  Blockage or narrowing of the coronary arteries in the heart.  Fluid around the heart.  Signs of weakness or disease in the muscles, valves, and tissues of the heart. Tell a health care provider about:  Any allergies you have. This is especially important if you have had a previous allergic reaction to contrast dye.  All medicines you are taking, including vitamins, herbs, eye drops, creams, and over-the-counter medicines.  Any blood disorders you have.  Any surgeries you have had.  Any medical conditions you have.  Whether you are pregnant or may be pregnant.  Any anxiety disorders, chronic pain, or other conditions you have that may increase your stress or prevent you from lying still. What are the risks? Generally, this is a safe procedure. However, problems may occur, including: 1. Bleeding. 2. Infection. 3. Allergic reactions to medicines or dyes. 4. Damage to other structures or organs. 5. Kidney damage from  the contrast dye that is used. 6. Increased risk of cancer from radiation exposure. This risk is low. Talk with your health care provider about: ? The risks and benefits of testing. ? How you can receive the lowest dose of radiation. What happens before the procedure? 1. Wear comfortable clothing and remove any jewelry, glasses, dentures, and hearing aids. 2. Follow instructions from your health care provider about eating and drinking. This may include: ? For 12 hours before the procedure - avoid caffeine. This includes tea, coffee, soda, energy drinks, and diet pills. Drink plenty of water or other fluids that do not have caffeine in them. Being well hydrated can prevent complications. ? For 4-6 hours before the procedure - stop eating and drinking. The contrast dye can cause nausea, but this is less likely if your stomach is empty. 3. Ask your health care provider about changing or stopping your regular medicines. This is especially important if you are taking diabetes medicines, blood thinners, or medicines to treat problems with erections (erectile dysfunction). What happens during the procedure?  1. Hair on your chest may need to  be removed so that small sticky patches called electrodes can be placed on your chest. These will transmit information that helps to monitor your heart during the procedure. 2. An IV will be inserted into one of your veins. 3. You might be given a medicine to control your heart rate during the procedure. This will help to ensure that good images are obtained. 4. You will be asked to lie on an exam table. This table will slide in and out of the CT machine during the procedure. 5. Contrast dye will be injected into the IV. You might feel warm, or you may get a metallic taste in your mouth. 6. You will be given a medicine called nitroglycerin. This will relax or dilate the arteries in your heart. 7. The table that you are lying on will move into the CT machine tunnel for  the scan. 8. The person running the machine will give you instructions while the scans are being done. You may be asked to: ? Keep your arms above your head. ? Hold your breath. ? Stay very still, even if the table is moving. 9. When the scanning is complete, you will be moved out of the machine. 10. The IV will be removed. The procedure may vary among health care providers and hospitals. What can I expect after the procedure? After your procedure, it is common to have:  A metallic taste in your mouth from the contrast dye.  A feeling of warmth.  A headache from the nitroglycerin. Follow these instructions at home:  Take over-the-counter and prescription medicines only as told by your health care provider.  If you are told, drink enough fluid to keep your urine pale yellow. This will help to flush the contrast dye out of your body.  Most people can return to their normal activities right after the procedure. Ask your health care provider what activities are safe for you.  It is up to you to get the results of your procedure. Ask your health care provider, or the department that is doing the procedure, when your results will be ready.  Keep all follow-up visits as told by your health care provider. This is important. Contact a health care provider if: 1. You have any symptoms of allergy to the contrast dye. These include: ? Shortness of breath. ? Rash or hives. ? A racing heartbeat. Summary  A cardiac CT angiogram is a procedure to look at the heart and the area around the heart. It may be done to help find the cause of chest pains or other symptoms of heart disease.  During this procedure, a large X-ray machine, called a CT scanner, takes detailed pictures of the heart and the surrounding area after a contrast dye has been injected into blood vessels in the area.  Ask your health care provider about changing or stopping your regular medicines before the procedure. This is  especially important if you are taking diabetes medicines, blood thinners, or medicines to treat erectile dysfunction.  If you are told, drink enough fluid to keep your urine pale yellow. This will help to flush the contrast dye out of your body. This information is not intended to replace advice given to you by your health care provider. Make sure you discuss any questions you have with your health care provider. Document Revised: 01/21/2019 Document Reviewed: 01/21/2019 Elsevier Patient Education  [place].

## 2020-07-25 NOTE — Progress Notes (Signed)
Cardiology Office Note:    Date:  07/25/2020   ID:  Marie Chen, DOB March 22, 1961, MRN [id number]  PCP:  [facility]  Cardiologist:  Garwin Brothers, MD   Referring MD: [facility]    ASSESSMENT:    1. Diabetes mellitus without complication (HCC)   2. Coronary artery disease involving native coronary artery of native heart without angina pectoris   3. Essential hypertension   4. Mixed hyperlipidemia   5. Diabetes mellitus due to underlying condition with unspecified complications (HCC)   6. Sleep apnea, obstructive   7. Pre-operative cardiovascular examination    PLAN:    In order of problems listed above:  1. Preoperative risk stratification: Chest pain: Coronary artery disease: Patient has known coronary artery disease and her recent noncontrast modified.  Her hemoglobin A1c is now getting better.  In view of her chest pain symptoms I will do a coronary CT angiography with FFR especially in view of the fact that she is planning to undergo major bariatric surgery.  She vocalized understanding.  She is agreeable.  She does have a fair degree of effort tolerance without any symptoms. 2. Essential hypertension: Blood pressure stable and diet was emphasized. 3. Mixed dyslipidemia and diabetes mellitus: I discussed this with the patient at length.  Diet was emphasized.  Lipids and diabetes followed by her endocrinologist Renae Fickle 4. Morbid obesity: Her weight reduction was emphasized.  She is doing her best to lose weight.  She is doing her best with her diet. 5. Ex-smoker: She quit about a year ago and promises never to go back to smoking. 6. Sleep apnea: Sleep health issues discussed and she follows up meticulously.  She uses her machine very appropriately. 7. Patient will be seen in follow-up appointment in 6 months or earlier if the patient has any concerns    Medication Adjustments/Labs and Tests Ordered: Current medicines are reviewed at length with  the patient today.  Concerns regarding medicines are outlined above.  No orders of the defined types were placed in this encounter.  No orders of the defined types were placed in this encounter.    No chief complaint on file.    History of Present Illness:    Marie Chen is a 60 y.o. female.  Patient has past medical history of nonobstructive coronary artery disease, essential hypertension and mixed dyslipidemia.  She leads a sedentary lifestyle.  She is planning to undergo bariatric surgery.  She occasionally complains of shortness of breath on exertion and chest tightness on exertion.  No radiation to the neck or to the arms.  Her coronary angiography done about 2 years ago revealed nonobstructive disease.  At the time of my evaluation, the patient is alert awake oriented and in no distress.  Past Medical History:  Diagnosis Date  . Acute hypoxemic respiratory failure due to severe acute respiratory syndrome coronavirus 2 (SARS-CoV-2) disease (HCC) 06/18/2019  . Acute pharyngitis 05/23/2009   Qualifier: Diagnosis of  By: Felicity Coyer MD, Raenette Rover Allergic rhinitis 03/26/2007   Qualifier: Diagnosis of  By: Jonny Ruiz MD, Len Blalock   Formatting of this note might be different from the original. Overview:  Qualifier: Diagnosis of  By: Jonny Ruiz MD, Len Blalock  . Asthma    severe  . ASTHMA NOS W/ACUTE EXACERBATION 03/26/2007   Qualifier: Diagnosis of  By: Jonny Ruiz MD, Len Blalock   . Asthma with exacerbation 03/26/2007   Formatting of this note might be different from the  original. Overview:  Qualifier: Diagnosis of  By: Jonny Ruiz MD, Len Blalock  . ASTHMATIC BRONCHITIS, ACUTE 04/27/2008   Qualifier: Diagnosis of  By: Jonny Ruiz MD, Len Blalock   . Bronchitis, chronic (HCC) 04/22/2008   Qualifier: Diagnosis of  By: Thurnell Lose of this note might be different from the original. Overview:  Qualifier: Diagnosis of  By: Alesia Richards  . CAD (coronary artery disease) 01/01/2019  . Carpal tunnel  syndrome of left wrist 12/30/2019  . Chronic obstructive pulmonary disease (HCC) 06/19/2019  . Chronic pain syndrome 04/25/2017  . Cigarette smoker 05/04/2019  . Community acquired pneumonia 01/15/2020  . COPD (chronic obstructive pulmonary disease) (HCC)    patient denies this dx  . Coronary artery disease   . Cough with hemoptysis 05/11/2018  . Depression   . DEPRESSION 01/05/2007   Qualifier: Diagnosis of  By: Jonny Ruiz MD, Len Blalock   . Diabetes mellitus due to underlying condition with unspecified complications (HCC) 03/26/2007   Qualifier: Diagnosis of  By: Jonny Ruiz MD, Len Blalock   . Diabetes mellitus type 2 in obese (HCC) 05/12/2018  . Diabetes mellitus without complication (HCC)    type 2  . DSORD CIRCADIAN RHY SHFT WRK SLEEP PHASE TYPE 03/26/2007   Qualifier: Diagnosis of  By: Jonny Ruiz MD, Len Blalock   Formatting of this note might be different from the original. Overview:  Qualifier: Diagnosis of  By: Jonny Ruiz MD, Len Blalock  . Dyslipidemia 10/19/2019  . DYSPHAGIA 04/27/2008   Qualifier: Diagnosis of  By: Marina Goodell MD, Wilhemina Bonito   . Dyspnea    with exertion  . Essential hypertension 01/05/2007   Qualifier: Diagnosis of  By: Jonny Ruiz MD, Len Blalock   . Fatty liver   . Fibromyalgia   . GERD 01/05/2007   Qualifier: Diagnosis of  By: Jonny Ruiz MD, Len Blalock   . GERD (gastroesophageal reflux disease)   . HAMMER TOE 01/05/2007   Qualifier: Diagnosis of  By: Jonny Ruiz MD, Len Blalock   . Headache 02/24/2009   Qualifier: Diagnosis of  By: Jonny Ruiz MD, Len Blalock  . HEADACHE 02/24/2009   Qualifier: Diagnosis of  By: Jonny Ruiz MD, Len Blalock   . Hemoptysis 05/11/2018  . HLD (hyperlipidemia)    diet controlled  . Hyperlipidemia 01/05/2007   diet controlled Formatting of this note might be different from the original. Overview:  Qualifier: Diagnosis of  By: Jonny Ruiz MD, Len Blalock  . Hypertension   . Hypothyroidism   . Inflammatory arthritis 07/22/2020  . IRRITABLE BOWEL SYNDROME, HX OF 03/26/2007   Qualifier: Diagnosis of  By: Jonny Ruiz MD, Len Blalock   . Lumbar facet  arthropathy 04/25/2017  . Lumbar radiculopathy 09/19/2017  . Migraine   . Migraine headache 04/22/2008   Formatting of this note might be different from the original. Overview:  Qualifier: Diagnosis of  By: Alesia Richards  . MIGRAINE, CHRONIC 04/22/2008   Qualifier: Diagnosis of  By: Alesia Richards    . Mixed dyslipidemia 01/05/2007   Qualifier: Diagnosis of  By: Jonny Ruiz MD, Len Blalock   . Morbid obesity (HCC) 12/04/2018  . Morbid obesity with BMI of 50.0-59.9, adult (HCC) 09/23/2019  . Numbness 12/04/2019  . Old disruption of left lateral collateral ligament 12/04/2019  . Osteoarthritis 07/22/2020  . Pain from implanted hardware 08/01/2017  . Pain in left shoulder 07/13/2020  . PEPTIC ULCER DISEASE 03/26/2007   Qualifier: Diagnosis of  By: Jonny Ruiz MD, Len Blalock   . PERSONAL HISTORY ENDOCRN METABOLIC&IMMUNITY  D/O 04/22/2008   Qualifier: Diagnosis of  By: Alesia Richards    . Plantar callus 08/01/2017  . Plantar fasciitis 08/01/2017  . Pre-ulcerative corn or callous 01/30/2018  . Preop cardiovascular exam 01/22/2020  . Primary osteoarthritis of both hips 04/25/2017  . Primary osteoarthritis of both knees 04/25/2017  . RESTLESS LEG SYNDROME, HX OF 03/26/2007   Qualifier: Diagnosis of  By: Jonny Ruiz MD, Len Blalock   . Segmental dysfunction of lumbar region 04/25/2017  . SINUSITIS- ACUTE-NOS 02/24/2009   Qualifier: Diagnosis of  By: Jonny Ruiz MD, Len Blalock   . Sjogren's disease [facility])   . Sleep apnea    CPAP nightly  . Sleep apnea, obstructive 01/05/2007   Qualifier: Diagnosis of  By: Jonny Ruiz MD, Len Blalock   Formatting of this note might be different from the original. Overview:  Qualifier: Diagnosis of  By: Jonny Ruiz MD, Len Blalock  . Thoracic spine pain 11/13/2017  . Thyroid disease   . Trigger finger, left little finger 12/30/2019  . Trochanteric bursitis of left hip 03/02/2018  . Type 2 diabetes mellitus with diabetic polyneuropathy, with long-term current use of insulin (HCC) 01/26/2020  . Type 2 diabetes mellitus with  hyperglycemia, without long-term current use of insulin (HCC) 10/19/2019    Past Surgical History:  Procedure Laterality Date  . ANKLE SURGERY  2010  . BACK SURGERY    . CARPAL TUNNEL RELEASE Bilateral   . CHOLECYSTECTOMY    . COLONOSCOPY  04/2014  . FOOT SURGERY     bunion surgery bialteral  . HYSTEROSCOPY    . LEFT HEART CATH AND CORONARY ANGIOGRAPHY N/A 12/19/2018   Procedure: LEFT HEART CATH AND CORONARY ANGIOGRAPHY;  Surgeon: Yvonne Kendall, MD;  Location: [facility];  Service: Cardiovascular;  Laterality: N/A;  . LIGAMENT REPAIR Left 02/04/2020   Procedure: REPAIR RADIAL COLLATERAL LIGAMENT METACARPAL PHALANGEAL LEFT SMALL FINGER; POSSIBLE PINNING;  Surgeon: Cindee Salt, MD;  Location: [facility] OR;  Service: Orthopedics;  Laterality: Left;  AXILLARY BLOCK  . RESECTION DISTAL CLAVICAL Right   . STERIOD INJECTION Left 02/04/2020   Procedure: INJECTION LEFT CARPAL TUNNEL;  Surgeon: Cindee Salt, MD;  Location: [facility] OR;  Service: Orthopedics;  Laterality: Left;  . TUBAL LIGATION    . UPPER GI ENDOSCOPY  04/2014  . WISDOM TOOTH EXTRACTION      Current Medications: Current Meds  Medication Sig  . albuterol (PROVENTIL) (2.5 MG/3ML) 0.083% nebulizer solution Take 2.5 mg by nebulization every 6 (six) hours as needed for wheezing or shortness of breath.   Marland Kitchen albuterol (VENTOLIN HFA) 108 (90 Base) MCG/ACT inhaler Inhale 1-2 puffs into the lungs every 6 (six) hours as needed for wheezing or shortness of breath.  Marland Kitchen aspirin EC 81 MG tablet Take 1 tablet (81 mg total) by mouth daily.  . calcium carbonate (TUMS - DOSED IN MG ELEMENTAL CALCIUM) 500 MG chewable tablet Chew 2 tablets by mouth daily as needed for indigestion or heartburn.  . Carboxymethylcellul-Glycerin (LUBRICATING EYE DROPS OP) Place 1 drop into both eyes daily as needed (dry eyes).  Marland Kitchen escitalopram (LEXAPRO) 20 MG tablet Take 20 mg by mouth at bedtime.   . Evolocumab (REPATHA) 140 MG/ML SOSY Inject 140 mg into the skin every 14  (fourteen) days.  . fluticasone (FLONASE) 50 MCG/ACT nasal spray Place 1 spray into both nostrils daily as needed for allergies.   . Fluticasone-Umeclidin-Vilant (TRELEGY ELLIPTA) 100-62.5-25 MCG/INH AEPB Inhale 1 puff into the lungs daily.   . insulin aspart (NOVOLOG FLEXPEN) 100  UNIT/ML FlexPen Inject 18 Units into the skin 3 (three) times daily with meals. Max daily 90 units  . insulin glargine (LANTUS) 100 unit/mL SOPN Inject 72 Units into the skin daily before breakfast.  . levothyroxine (SYNTHROID) 125 MCG tablet Take 125 mcg by mouth daily before breakfast.  . lisinopril-hydrochlorothiazide (PRINZIDE,ZESTORETIC) 20-25 MG tablet Take 1 tablet by mouth daily with breakfast.  . Magnesium 400 MG TABS Take 400 mg by mouth daily.   . montelukast (SINGULAIR) 10 MG tablet Take 10 mg by mouth daily with breakfast.  . naproxen sodium (ALEVE) 220 MG tablet Take 440 mg by mouth daily as needed (pain).  . nitroGLYCERIN (NITROSTAT) 0.4 MG SL tablet Place 0.4 mg under the tongue every 5 (five) minutes as needed for chest pain.  Marland Kitchen OVER THE COUNTER MEDICATION Take 300 mg by mouth at bedtime as needed (sleep). CBD oil  . oxyCODONE-acetaminophen (PERCOCET) 7.5-325 MG tablet TAKE 1 TABLET BY MOUTH EVERY 6 HOURS AS NEEDED FOR CHRONIC PAIN. MAX 4 PER DAY  . pregabalin (LYRICA) 150 MG capsule Take 150 mg by mouth 3 (three) times daily.  Marland Kitchen terconazole (TERAZOL 7) 0.4 % vaginal cream Place 1 applicator vaginally at bedtime as needed (yeast infections).   . topiramate (TOPAMAX) 25 MG tablet Take 25 mg by mouth at bedtime.  . Vitamin D, Ergocalciferol, (DRISDOL) 1.25 MG (50000 UT) CAPS capsule Take 50,000 Units by mouth every Saturday.     Allergies:   Macrodantin [nitrofurantoin macrocrystal], Metformin and related, Atorvastatin, Hydromorphone hcl, Lipitor [atorvastatin calcium], Meperidine hcl, and Tetracyclines & related   Social History   Socioeconomic History  . Marital status: Widowed    Spouse name: Not  on file  . Number of children: 4  . Years of education: Not on file  . Highest education level: Not on file  Occupational History  . Occupation: CSR    Employer: [employer]  Tobacco Use  . Smoking status: Former Smoker    Types: Cigarettes    Quit date: 06/15/2019    Years since quitting: 1.1  . Smokeless tobacco: Never Used  Vaping Use  . Vaping Use: Never used  Substance and Sexual Activity  . Alcohol use: No    Alcohol/week: 0.0 standard drinks  . Drug use: No  . Sexual activity: Yes    Birth control/protection: Post-menopausal  Other Topics Concern  . Not on file  Social History Narrative  . Not on file   Social Determinants of Health   Financial Resource Strain: Not on file  Food Insecurity: Not on file  Transportation Needs: Not on file  Physical Activity: Not on file  Stress: Not on file  Social Connections: Not on file     Family History: The patient's family history includes Heart failure in her father. There is no history of Colon cancer.  ROS:   Please see the history of present illness.    All other systems reviewed and are negative.  EKGs/Labs/Other Studies Reviewed:    The following studies were reviewed today:  EKG done today reveals sinus rhythm and nonspecific ST-T changes  Procedures  LEFT HEART CATH AND CORONARY ANGIOGRAPHY    Conclusion  Conclusions: 1. Mild to moderate, non-obstructive coronary artery disease, including 30% mid LAD stenosis, 30-40% ostial D1 lesion, 20% ostial LCx stenosis, and 30% ostial and 20% mid RCA stenosis. 2. Mildly elevated left-ventricular filling pressure with normal left ventricular contraction. 3. Catheter-induced LBBB during left heart catheterization.  Recommendations: 1. Medical therapy and risk factor modification  to prevent progression of coronary artery disease. 2. Post-cath telemetry monitoring, given catheter-induced LBBB at the end of the procedure. 3. Consider outpatient evaluation of other  alternative causes of chest pain.  Yvonne Kendallhristopher End, MD [facility] Pager: [phone]    Recent Labs: 02/01/2020: ALT 19; BUN 10; Creatinine, Ser 0.65; Hemoglobin 9.5; Platelets 358; Potassium 4.0; Sodium 136 05/26/2020: TSH 0.47  Recent Lipid Panel    Component Value Date/Time   CHOL 171 05/26/2020 1118   CHOL 179 01/07/2019 1019   TRIG 88.0 05/26/2020 1118   HDL 38.90 (L) 05/26/2020 1118   HDL 34 (L) 01/07/2019 1019   CHOLHDL 4 05/26/2020 1118   VLDL 17.6 05/26/2020 1118   LDLCALC 114 (H) 05/26/2020 1118   LDLCALC 123 (H) 01/07/2019 1019   LDLDIRECT 179.5 03/14/2009 1147    Physical Exam:    VS:  BP 132/78   Pulse 66   Ht 5\' 8"  (1.727 m)   Wt (!) 311 lb 3.2 oz (141.2 kg)   LMP  (LMP Unknown)   SpO2 95%   BMI 47.32 kg/m     Wt Readings from Last 3 Encounters:  07/25/20 (!) 311 lb 3.2 oz (141.2 kg)  07/13/20 (!) 310 lb (140.6 kg)  05/26/20 (!) 318 lb 8 oz (144.5 kg)     GEN: Patient is in no acute distress HEENT: Normal NECK: No JVD; No carotid bruits LYMPHATICS: No lymphadenopathy CARDIAC: Hear sounds regular, 2/6 systolic murmur at the apex. RESPIRATORY:  Clear to auscultation without rales, wheezing or rhonchi  ABDOMEN: Soft, non-tender, non-distended MUSCULOSKELETAL:  No edema; No deformity  SKIN: Warm and dry NEUROLOGIC:  Alert and oriented x 3 PSYCHIATRIC:  Normal affect   Signed, Garwin Brothersajan R Tamarion Haymond, MD  07/25/2020 8:48 AM    [facility]

## 2020-07-28 ENCOUNTER — Ambulatory Visit: Payer: Medicare HMO | Admitting: Orthopaedic Surgery

## 2020-07-29 LAB — BASIC METABOLIC PANEL
BUN/Creatinine Ratio: 29 — ABNORMAL HIGH (ref 12–28)
BUN: 18 mg/dL (ref 8–27)
CO2: 23 mmol/L (ref 20–29)
Calcium: 9.4 mg/dL (ref 8.7–10.3)
Chloride: 104 mmol/L (ref 96–106)
Creatinine, Ser: 0.63 mg/dL (ref 0.57–1.00)
GFR calc Af Amer: 112 mL/min/{1.73_m2} (ref >59)
GFR calc non Af Amer: 97 mL/min/{1.73_m2} (ref >59)
Glucose: 82 mg/dL (ref 65–99)
Potassium: 4.4 mmol/L (ref 3.5–5.2)
Sodium: 139 mmol/L (ref 134–144)

## 2020-08-03 ENCOUNTER — Telehealth: Payer: Self-pay | Admitting: Cardiology

## 2020-08-03 ENCOUNTER — Telehealth (HOSPITAL_COMMUNITY): Payer: Self-pay | Admitting: Emergency Medicine

## 2020-08-03 NOTE — Telephone Encounter (Signed)
I s/w Kelly with [facility] in regards to if records, clearance request could please be faxed over to our office [phone]. Tresa Endo stated she s/w the surgeon's CMA and asked for her to get records and clearance request faxed to our office. I thanked Tresa Endo for her help in this matter. Our office will keep an eye out for records and clearance request to come over.

## 2020-08-03 NOTE — Telephone Encounter (Signed)
Follow Up:     Pt is calling to check on the status of her clearance. 

## 2020-08-03 NOTE — Telephone Encounter (Signed)
Reaching out to patient to offer assistance regarding upcoming cardiac imaging study; pt verbalizes understanding of appt date/time, parking situation and where to check in, pre-test NPO status and medications ordered, and verified current allergies; name and call back number provided for further questions should they arise Sharne Linders RN Navigator Cardiac Imaging [facility] [phone] office [phone] cell 

## 2020-08-03 NOTE — Telephone Encounter (Signed)
Pt was seen 07/25/2020 by Dr Tomie China for pre op clearance.  I don't see an official clearance request.  Dr Tomie China ordered a coronary CTA which is scheduled for tomorrow. Clearance will depend on his review of the results.  Corine Shelter [name] 08/03/2020 10:39 AM

## 2020-08-03 NOTE — Telephone Encounter (Signed)
I called the patient and told her clearance will depend on the CT results.  Corine Shelter [name] 08/03/2020 11:21 AM

## 2020-08-04 NOTE — Telephone Encounter (Signed)
Pending cardiac CT tomorrow

## 2020-08-05 ENCOUNTER — Ambulatory Visit (HOSPITAL_COMMUNITY)
Admission: RE | Admit: 2020-08-05 | Discharge: 2020-08-05 | Disposition: A | Discharge status: Home / Self Care | Payer: Medicare HMO | Source: Ambulatory Visit | Attending: Cardiology | Admitting: Cardiology

## 2020-08-05 ENCOUNTER — Other Ambulatory Visit: Payer: Self-pay

## 2020-08-05 DIAGNOSIS — I2510 Atherosclerotic heart disease of native coronary artery without angina pectoris: Secondary | ICD-10-CM | POA: Diagnosis present

## 2020-08-05 DIAGNOSIS — Z01810 Encounter for preprocedural cardiovascular examination: Secondary | ICD-10-CM | POA: Diagnosis present

## 2020-08-05 DIAGNOSIS — I700 Atherosclerosis of aorta: Secondary | ICD-10-CM

## 2020-08-05 DIAGNOSIS — E088 Diabetes mellitus due to underlying condition with unspecified complications: Secondary | ICD-10-CM | POA: Diagnosis present

## 2020-08-05 MED ORDER — NITROGLYCERIN 0.4 MG SL SUBL
SUBLINGUAL_TABLET | SUBLINGUAL | Status: AC
  Administered 2020-08-05: 0.8 mg via SUBLINGUAL
  Filled 2020-08-05: qty 2

## 2020-08-05 MED ORDER — IOHEXOL 350 MG/ML SOLN
80.0000 mL | Freq: Once | INTRAVENOUS | Status: AC | PRN
  Administered 2020-08-05: 80 mL via INTRAVENOUS

## 2020-08-05 MED ORDER — NITROGLYCERIN 0.4 MG SL SUBL: 0.8000 mg | SUBLINGUAL_TABLET | Freq: Once | SUBLINGUAL | Status: AC

## 2020-08-05 NOTE — Telephone Encounter (Signed)
I s/w the Bariatric Surgeon's office to inform that we still have yet to receive clearance form. I assured they will send a message to the nurse/MA and have them call me back Monday 08/08/20, gave my direct extension [phone]. Looking at some of th notes that I can see in the chart, pt had visit with Nelly Laurence, [name] on 07/29/20. In the notes I read looks like procedure may be RNY Gastric Bypass or Gastric Sleeve, surgeon Dr. Cliffton Asters. No other information available to try and complete a clearance request. Will await call back from surgeon's office Monday.

## 2020-08-06 NOTE — Telephone Encounter (Signed)
Unable to provide clearance due to coronary CT yesterday was nondiagnostic.  MD to review to see if alternative testing is needed.

## 2020-08-09 ENCOUNTER — Telehealth: Payer: Self-pay | Admitting: Cardiology

## 2020-08-09 DIAGNOSIS — Z01810 Encounter for preprocedural cardiovascular examination: Secondary | ICD-10-CM

## 2020-08-09 DIAGNOSIS — R0789 Other chest pain: Secondary | ICD-10-CM

## 2020-08-09 DIAGNOSIS — I2510 Atherosclerotic heart disease of native coronary artery without angina pectoris: Secondary | ICD-10-CM

## 2020-08-09 NOTE — Telephone Encounter (Signed)
Dr. Tomie China has forward note to his nurse Carmelia Bake. RN to schedule Eugenie Birks, see previous notes.

## 2020-08-09 NOTE — Telephone Encounter (Signed)
° ° °  Pt is calling to f/u CT result and if Dr. Tomie China approve her clearance

## 2020-08-09 NOTE — Telephone Encounter (Signed)
Recommendations reviewed with pt as per Dr. Revankar's note.  Pt verbalized understanding and had no additional questions.   

## 2020-08-09 NOTE — Telephone Encounter (Signed)
Message left for pt to call back.

## 2020-08-09 NOTE — Telephone Encounter (Signed)
Will send to pre op to follow up, see previous notes.

## 2020-08-09 NOTE — Telephone Encounter (Signed)
Please schedule patient for Lexiscan sestamibi ASAP.  CT was not conclusive.

## 2020-08-10 ENCOUNTER — Telehealth (HOSPITAL_COMMUNITY): Payer: Self-pay | Admitting: *Deleted

## 2020-08-10 NOTE — Telephone Encounter (Signed)
Patient given detailed instructions per Myocardial Perfusion Study Information Sheet for the test on 08/10/20. Patient notified to arrive 15 minutes early and that it is imperative to arrive on time for appointment to keep from having the test rescheduled.  If you need to cancel or reschedule your appointment, please call the office within 24 hours of your appointment. . Patient verbalized understanding.Marie Chen Marie Chen    

## 2020-08-12 ENCOUNTER — Telehealth: Payer: Self-pay | Admitting: Cardiology

## 2020-08-12 NOTE — Telephone Encounter (Signed)
Patient is returning call to Dr. Kem Parkinson nurse.

## 2020-08-12 NOTE — Addendum Note (Signed)
Addended by: Eleonore Chiquito on: 08/12/2020 03:03 PM   Modules accepted: Orders

## 2020-08-12 NOTE — Telephone Encounter (Signed)
Results reviewed with pt as per Dr. Revankar's note.  Pt verbalized understanding and had no additional questions.   

## 2020-08-15 NOTE — Telephone Encounter (Signed)
[id number]-we still have yet to receive clearance form

## 2020-08-16 ENCOUNTER — Ambulatory Visit (INDEPENDENT_AMBULATORY_CARE_PROVIDER_SITE_OTHER): Payer: Medicare HMO

## 2020-08-16 ENCOUNTER — Other Ambulatory Visit: Payer: Self-pay

## 2020-08-16 DIAGNOSIS — I2510 Atherosclerotic heart disease of native coronary artery without angina pectoris: Secondary | ICD-10-CM

## 2020-08-16 DIAGNOSIS — Z01810 Encounter for preprocedural cardiovascular examination: Secondary | ICD-10-CM | POA: Diagnosis not present

## 2020-08-16 MED ORDER — TECHNETIUM TC 99M TETROFOSMIN IV KIT
32.4000 | PACK | Freq: Once | INTRAVENOUS | Status: AC | PRN
  Administered 2020-08-16: 32.4 via INTRAVENOUS

## 2020-08-16 MED ORDER — REGADENOSON 0.4 MG/5ML IV SOLN
0.4000 mg | Freq: Once | INTRAVENOUS | Status: AC
  Administered 2020-08-16: 0.4 mg via INTRAVENOUS

## 2020-08-16 MED ORDER — AMINOPHYLLINE 25 MG/ML IV SOLN
75.0000 mg | Freq: Once | INTRAVENOUS | Status: AC
  Administered 2020-08-16: 75 mg via INTRAVENOUS

## 2020-08-17 ENCOUNTER — Ambulatory Visit: Payer: Medicare HMO

## 2020-08-17 LAB — MYOCARDIAL PERFUSION IMAGING
LV dias vol: 113 mL (ref 46–106)
LV sys vol: 47 mL
Peak HR: 86 {beats}/min
Rest HR: 63 {beats}/min
SDS: 4
SRS: 7
SSS: 11
TID: 1.11

## 2020-08-17 MED ORDER — TECHNETIUM TC 99M TETROFOSMIN IV KIT
30.2000 | PACK | Freq: Once | INTRAVENOUS | Status: AC | PRN
  Administered 2020-08-17: 30.2 via INTRAVENOUS

## 2020-08-22 NOTE — Telephone Encounter (Signed)
Called Bariatric office and operator(Lolita) does not see cardiac clearance form either. She will send message to have them fax this...again she will mark as urgent.

## 2020-08-23 ENCOUNTER — Encounter: Payer: Self-pay | Admitting: Internal Medicine

## 2020-08-26 ENCOUNTER — Ambulatory Visit: Payer: Medicare HMO | Admitting: Internal Medicine

## 2020-08-26 ENCOUNTER — Encounter: Payer: Self-pay | Admitting: Internal Medicine

## 2020-08-26 ENCOUNTER — Other Ambulatory Visit: Payer: Self-pay

## 2020-08-26 VITALS — BP 130/82 | HR 72 | Ht 68.0 in | Wt 308.2 lb

## 2020-08-26 DIAGNOSIS — E1165 Type 2 diabetes mellitus with hyperglycemia: Secondary | ICD-10-CM | POA: Diagnosis not present

## 2020-08-26 DIAGNOSIS — E785 Hyperlipidemia, unspecified: Secondary | ICD-10-CM | POA: Diagnosis not present

## 2020-08-26 DIAGNOSIS — E890 Postprocedural hypothyroidism: Secondary | ICD-10-CM | POA: Diagnosis not present

## 2020-08-26 LAB — COMPREHENSIVE METABOLIC PANEL
ALT: 14 U/L (ref 0–35)
AST: 19 U/L (ref 0–37)
Albumin: 3.9 g/dL (ref 3.5–5.2)
Alkaline Phosphatase: 85 U/L (ref 39–117)
BUN: 20 mg/dL (ref 6–23)
CO2: 26 mEq/L (ref 19–32)
Calcium: 9.6 mg/dL (ref 8.4–10.5)
Chloride: 106 mEq/L (ref 96–112)
Creatinine, Ser: 0.54 mg/dL (ref 0.40–1.20)
GFR: 99.3 mL/min (ref >60.00)
Glucose, Bld: 112 mg/dL — ABNORMAL HIGH (ref 70–99)
Potassium: 3.8 mEq/L (ref 3.5–5.1)
Sodium: 139 mEq/L (ref 135–145)
Total Bilirubin: 0.6 mg/dL (ref 0.2–1.2)
Total Protein: 6.9 g/dL (ref 6.0–8.3)

## 2020-08-26 LAB — POCT GLYCOSYLATED HEMOGLOBIN (HGB A1C): Hemoglobin A1C: 5.4 % (ref 4.0–5.6)

## 2020-08-26 LAB — LIPID PANEL
Cholesterol: 105 mg/dL (ref 0–200)
HDL: 44.6 mg/dL (ref >39.00)
LDL Cholesterol: 45 mg/dL (ref 0–99)
NonHDL: 60.17
Total CHOL/HDL Ratio: 2
Triglycerides: 74 mg/dL (ref 0.0–149.0)
VLDL: 14.8 mg/dL (ref 0.0–40.0)

## 2020-08-26 LAB — TSH: TSH: 0.45 u[IU]/mL (ref 0.35–4.50)

## 2020-08-26 NOTE — Progress Notes (Signed)
Name: Marie Chen  Age/ Sex: 60 y.o., female   MRN/ DOB: [id number], 02/06/1961     [facility]P: [facility]   Reason for Endocrinology Evaluation: Type 2 Diabetes Mellitus  Initial Endocrine Consultative Visit: 10/19/2019    PATIENT IDENTIFIER: Marie Chen is a 60 y.o. female with a past medical history of DM, COPD,sjogren's syndrome , OSA on BiPAP, CAD,  Dyslipidemia and pancreatitis. The patient has followed with Endocrinology clinic since 10/19/2019 for consultative assistance with management of her diabetes.  DIABETIC HISTORY:  [name] was diagnosed with DM in 2015. Pt endorses genital skin irritation secondary to Metformin ?Marcelline Deist. Farxiga caused genital infections. Her hemoglobin A1c has ranged from 8.8% in 03/2019 , peaking at 10.5% in 2021.  On her initial visit to  Our clinic her A1c 9.0% . We adjust MDI regimen     THYROID HISTORY:  In 1994 she had RAI ablation secondary to hyperthyroidism. She needed LT-4 replacement shortly after RAI ablation .    Mother with cushing syndrome   SUBJECTIVE:   During the last visit (05/26/2020): A1c 6.0 % . Adjusted MDI regimen      Today (08/26/2020): [name] is here for a follow up on diabetes management.  She checks her blood sugars 2-3 times daily, preprandial. The patient has not had hypoglycemic episodes since the last clinic visit.    She is scheduled for weight loss sx  Spring 2022   Denies nausea or diarrhea   She is going to liver cleanse 3 shakes a day  And 2 snacks a day  for 2 weeks   HOME ENDOCRINE REGIMEN:  Lantus 72 units daily  Novolog 18 units TIDQAC  CF : Novolog (BG-130/25)  Levothyroxine 125 mcg daily     Statin: yes ACE-I/ARB: yes    METER DOWNLOAD SUMMARY: Date range evaluated: 3/4-3/18/2022 Average Number Tests/Day = 2.1 Overall Mean FS Glucose = 110   BG Ranges: Low = 83 High = 215   Hypoglycemic Events/30 Days: BG < 50 = 0 Episodes of symptomatic severe  hypoglycemia = 0    DIABETIC COMPLICATIONS: Microvascular complications:   Neuropathy   Denies: CKD, retinopathy   Last eye exam: Completed 01/26/2020  Macrovascular complications:   CAD ( medically treated per pt)   Denies: CAD, PVD, CVA   HISTORY:  Past Medical History:  Past Medical History:  Diagnosis Date  . Acute hypoxemic respiratory failure due to severe acute respiratory syndrome coronavirus 2 (SARS-CoV-2) disease (HCC) 06/18/2019  . Acute pharyngitis 05/23/2009   Qualifier: Diagnosis of  By: Felicity CoyerLeschber MD, Raenette RoverValerie A   . Allergic rhinitis 03/26/2007   Qualifier: Diagnosis of  By: Jonny RuizJohn MD, Len BlalockJames W   Formatting of this note might be different from the original. Overview:  Qualifier: Diagnosis of  By: Jonny RuizJohn MD, Len BlalockJames W  . Asthma    severe  . ASTHMA NOS W/ACUTE EXACERBATION 03/26/2007   Qualifier: Diagnosis of  By: Jonny RuizJohn MD, Len BlalockJames W   . Asthma with exacerbation 03/26/2007   Formatting of this note might be different from the original. Overview:  Qualifier: Diagnosis of  By: Jonny RuizJohn MD, Len BlalockJames W  . ASTHMATIC BRONCHITIS, ACUTE 04/27/2008   Qualifier: Diagnosis of  By: Jonny RuizJohn MD, Len BlalockJames W   . Bronchitis, chronic (HCC) 04/22/2008   Qualifier: Diagnosis of  By: Thurnell LoseSmith NCMA, Barbara    Formatting of this note might be different from the original. Overview:  Qualifier: Diagnosis of  By: Alesia RichardsSmith NCMA, Barbara  . CAD (  coronary artery disease) 01/01/2019  . Carpal tunnel syndrome of left wrist 12/30/2019  . Chronic obstructive pulmonary disease (HCC) 06/19/2019  . Chronic pain syndrome 04/25/2017  . Cigarette smoker 05/04/2019  . Community acquired pneumonia 01/15/2020  . COPD (chronic obstructive pulmonary disease) (HCC)    patient denies this dx  . Coronary artery disease   . Cough with hemoptysis 05/11/2018  . Depression   . DEPRESSION 01/05/2007   Qualifier: Diagnosis of  By: Jonny Ruiz MD, Len Blalock   . Diabetes mellitus due to underlying condition with unspecified complications (HCC) 03/26/2007    Qualifier: Diagnosis of  By: Jonny Ruiz MD, Len Blalock   . Diabetes mellitus type 2 in obese (HCC) 05/12/2018  . Diabetes mellitus without complication (HCC)    type 2  . DSORD CIRCADIAN RHY SHFT WRK SLEEP PHASE TYPE 03/26/2007   Qualifier: Diagnosis of  By: Jonny Ruiz MD, Len Blalock   Formatting of this note might be different from the original. Overview:  Qualifier: Diagnosis of  By: Jonny Ruiz MD, Len Blalock  . Dyslipidemia 10/19/2019  . DYSPHAGIA 04/27/2008   Qualifier: Diagnosis of  By: Marina Goodell MD, Wilhemina Bonito   . Dyspnea    with exertion  . Essential hypertension 01/05/2007   Qualifier: Diagnosis of  By: Jonny Ruiz MD, Len Blalock   . Fatty liver   . Fibromyalgia   . GERD 01/05/2007   Qualifier: Diagnosis of  By: Jonny Ruiz MD, Len Blalock   . GERD (gastroesophageal reflux disease)   . HAMMER TOE 01/05/2007   Qualifier: Diagnosis of  By: Jonny Ruiz MD, Len Blalock   . Headache 02/24/2009   Qualifier: Diagnosis of  By: Jonny Ruiz MD, Len Blalock  . HEADACHE 02/24/2009   Qualifier: Diagnosis of  By: Jonny Ruiz MD, Len Blalock   . Hemoptysis 05/11/2018  . HLD (hyperlipidemia)    diet controlled  . Hyperlipidemia 01/05/2007   diet controlled Formatting of this note might be different from the original. Overview:  Qualifier: Diagnosis of  By: Jonny Ruiz MD, Len Blalock  . Hypertension   . Hypothyroidism   . Inflammatory arthritis 07/22/2020  . IRRITABLE BOWEL SYNDROME, HX OF 03/26/2007   Qualifier: Diagnosis of  By: Jonny Ruiz MD, Len Blalock   . Lumbar facet arthropathy 04/25/2017  . Lumbar radiculopathy 09/19/2017  . Migraine   . Migraine headache 04/22/2008   Formatting of this note might be different from the original. Overview:  Qualifier: Diagnosis of  By: Alesia Richards  . MIGRAINE, CHRONIC 04/22/2008   Qualifier: Diagnosis of  By: Alesia Richards    . Mixed dyslipidemia 01/05/2007   Qualifier: Diagnosis of  By: Jonny Ruiz MD, Len Blalock   . Morbid obesity (HCC) 12/04/2018  . Morbid obesity with BMI of 50.0-59.9, adult (HCC) 09/23/2019  . Numbness 12/04/2019  . Old disruption  of left lateral collateral ligament 12/04/2019  . Osteoarthritis 07/22/2020  . Pain from implanted hardware 08/01/2017  . Pain in left shoulder 07/13/2020  . PEPTIC ULCER DISEASE 03/26/2007   Qualifier: Diagnosis of  By: Jonny Ruiz MD, Len Blalock   . PERSONAL HISTORY ENDOCRN METABOLIC&IMMUNITY D/O 04/22/2008   Qualifier: Diagnosis of  By: Alesia Richards    . Plantar callus 08/01/2017  . Plantar fasciitis 08/01/2017  . Pre-ulcerative corn or callous 01/30/2018  . Preop cardiovascular exam 01/22/2020  . Primary osteoarthritis of both hips 04/25/2017  . Primary osteoarthritis of both knees 04/25/2017  . RESTLESS LEG SYNDROME, HX OF 03/26/2007   Qualifier: Diagnosis of  By: Jonny Ruiz MD, Len Blalock   .  Segmental dysfunction of lumbar region 04/25/2017  . SINUSITIS- ACUTE-NOS 02/24/2009   Qualifier: Diagnosis of  By: Jonny Ruiz MD, Len Blalock   . Sjogren's disease [facility])   . Sleep apnea    CPAP nightly  . Sleep apnea, obstructive 01/05/2007   Qualifier: Diagnosis of  By: Jonny Ruiz MD, Len Blalock   Formatting of this note might be different from the original. Overview:  Qualifier: Diagnosis of  By: Jonny Ruiz MD, Len Blalock  . Thoracic spine pain 11/13/2017  . Thyroid disease   . Trigger finger, left little finger 12/30/2019  . Trochanteric bursitis of left hip 03/02/2018  . Type 2 diabetes mellitus with diabetic polyneuropathy, with long-term current use of insulin (HCC) 01/26/2020  . Type 2 diabetes mellitus with hyperglycemia, without long-term current use of insulin (HCC) 10/19/2019   Past Surgical History:  Past Surgical History:  Procedure Laterality Date  . ANKLE SURGERY  2010  . BACK SURGERY    . CARPAL TUNNEL RELEASE Bilateral   . CHOLECYSTECTOMY    . COLONOSCOPY  04/2014  . FOOT SURGERY     bunion surgery bialteral  . HYSTEROSCOPY    . LEFT HEART CATH AND CORONARY ANGIOGRAPHY N/A 12/19/2018   Procedure: LEFT HEART CATH AND CORONARY ANGIOGRAPHY;  Surgeon: Yvonne Kendall, MD;  Location: [facility];  Service:  Cardiovascular;  Laterality: N/A;  . LIGAMENT REPAIR Left 02/04/2020   Procedure: REPAIR RADIAL COLLATERAL LIGAMENT METACARPAL PHALANGEAL LEFT SMALL FINGER; POSSIBLE PINNING;  Surgeon: Cindee Salt, MD;  Location: [facility] OR;  Service: Orthopedics;  Laterality: Left;  AXILLARY BLOCK  . RESECTION DISTAL CLAVICAL Right   . STERIOD INJECTION Left 02/04/2020   Procedure: INJECTION LEFT CARPAL TUNNEL;  Surgeon: Cindee Salt, MD;  Location: [facility] OR;  Service: Orthopedics;  Laterality: Left;  . TUBAL LIGATION    . UPPER GI ENDOSCOPY  04/2014  . WISDOM TOOTH EXTRACTION      Social History:  reports that she quit smoking about 14 months ago. Her smoking use included cigarettes. She has never used smokeless tobacco. She reports that she does not drink alcohol and does not use drugs. Family History:  Family History  Problem Relation Age of Onset  . Heart failure Father   . Colon cancer Neg Hx      HOME MEDICATIONS: Allergies as of 08/26/2020      Reactions   Macrodantin [nitrofurantoin Macrocrystal] Rash   Metformin And Related Other (See Comments)   Causes yeast infections   Atorvastatin Other (See Comments)   Hydromorphone Hcl Nausea And Vomiting   Lipitor [atorvastatin Calcium] Diarrhea   Meperidine Hcl Nausea Only   Is ok if given with Phenergan   Tetracyclines & Related Rash      Medication List       Accurate as of August 26, 2020 11:34 AM. If you have any questions, ask your nurse or doctor.        STOP taking these medications   metoprolol tartrate 100 MG tablet Commonly known as: LOPRESSOR Stopped by: Scarlette Shorts, MD     TAKE these medications   albuterol 108 (90 Base) MCG/ACT inhaler Commonly known as: VENTOLIN HFA Inhale 1-2 puffs into the lungs every 6 (six) hours as needed for wheezing or shortness of breath.   albuterol (2.5 MG/3ML) 0.083% nebulizer solution Commonly known as: PROVENTIL Take 2.5 mg by nebulization every 6 (six) hours as needed for wheezing or  shortness of breath.   aspirin EC 81 MG tablet Take 1 tablet (  81 mg total) by mouth daily.   calcium carbonate 500 MG chewable tablet Commonly known as: TUMS - dosed in mg elemental calcium Chew 2 tablets by mouth daily as needed for indigestion or heartburn.   escitalopram 20 MG tablet Commonly known as: LEXAPRO Take 20 mg by mouth at bedtime.   fluticasone 50 MCG/ACT nasal spray Commonly known as: FLONASE Place 1 spray into both nostrils daily as needed for allergies.   insulin glargine 100 unit/mL Sopn Commonly known as: LANTUS Inject 72 Units into the skin daily before breakfast.   levothyroxine 125 MCG tablet Commonly known as: SYNTHROID Take 125 mcg by mouth daily before breakfast.   lisinopril-hydrochlorothiazide 20-25 MG tablet Commonly known as: ZESTORETIC Take 1 tablet by mouth daily with breakfast.   LUBRICATING EYE DROPS OP Place 1 drop into both eyes daily as needed (dry eyes).   Magnesium 400 MG Tabs Take 400 mg by mouth daily.   montelukast 10 MG tablet Commonly known as: SINGULAIR Take 10 mg by mouth daily with breakfast.   naproxen sodium 220 MG tablet Commonly known as: ALEVE Take 440 mg by mouth daily as needed (pain).   nitroGLYCERIN 0.4 MG SL tablet Commonly known as: NITROSTAT Place 0.4 mg under the tongue every 5 (five) minutes as needed for chest pain.   NovoLOG FlexPen 100 UNIT/ML FlexPen Generic drug: insulin aspart Inject 18 Units into the skin 3 (three) times daily with meals. Max daily 90 units   OVER THE COUNTER MEDICATION Take 300 mg by mouth at bedtime as needed (sleep). CBD oil   oxyCODONE-acetaminophen 7.5-325 MG tablet Commonly known as: PERCOCET TAKE 1 TABLET BY MOUTH EVERY 6 HOURS AS NEEDED FOR CHRONIC PAIN. MAX 4 PER DAY   pregabalin 150 MG capsule Commonly known as: LYRICA Take 150 mg by mouth 3 (three) times daily.   Repatha 140 MG/ML Sosy Generic drug: Evolocumab Inject 140 mg into the skin every 14 (fourteen)  days.   terconazole 0.4 % vaginal cream Commonly known as: TERAZOL 7 Place 1 applicator vaginally at bedtime as needed (yeast infections).   topiramate 25 MG tablet Commonly known as: TOPAMAX Take 25 mg by mouth at bedtime.   Trelegy Ellipta 100-62.5-25 MCG/INH Aepb Generic drug: Fluticasone-Umeclidin-Vilant Inhale 1 puff into the lungs daily.   Vitamin D (Ergocalciferol) 1.25 MG (50000 UNIT) Caps capsule Commonly known as: DRISDOL Take 50,000 Units by mouth every Saturday.        OBJECTIVE:   Vital Signs: BP 130/82   Pulse 72   Ht  (1.727 m)   Wt (!) 308 lb 4 oz (139.8 kg)   LMP  (LMP Unknown)   SpO2 94%   BMI 46.87 kg/m   Wt Readings from Last 3 Encounters:  08/26/20 (!) 308 lb 4 oz (139.8 kg)  08/16/20 (!) 311 lb (141.1 kg)  07/25/20 (!) 311 lb 3.2 oz (141.2 kg)     Exam: General: Pt appears well and is in NAD  Lungs: Clear with good BS bilat   Heart: RRR   Extremities: No pretibial edema.   Neuro: MS is good with appropriate affect, pt is alert and Ox3    DM foot exam: 10/19/2019 The skin of the feet is without sores or ulcerations but has thickened toe nails and plantar formation noted B/L  The pedal pulses are 2+ on right and 2+ on left. The sensation is decreased  to a screening 5.07, 10 gram monofilament bilaterally   DATA REVIEWED:  Lab Results  Component Value Date   HGBA1C 5.4 08/26/2020   HGBA1C 6.0 (A) 05/26/2020   HGBA1C 8.0 (A) 01/25/2020   Results for WILLISTINE, FERRALL (MRN [id number]) as of 08/29/2020 09:00  Ref. Range 08/26/2020 10:59  Sodium Latest Ref Range: 135 - 145 mEq/L 139  Potassium Latest Ref Range: 3.5 - 5.1 mEq/L 3.8  Chloride Latest Ref Range: 96 - 112 mEq/L 106  CO2 Latest Ref Range: 19 - 32 mEq/L 26  Glucose Latest Ref Range: 70 - 99 mg/dL 176 (H)  BUN Latest Ref Range: 6 - 23 mg/dL 20  Creatinine Latest Ref Range: 0.40 - 1.20 mg/dL 1.60  Calcium Latest Ref Range: 8.4 - 10.5 mg/dL 9.6  Alkaline Phosphatase  Latest Ref Range: 39 - 117 U/L 85  Albumin Latest Ref Range: 3.5 - 5.2 g/dL 3.9  AST Latest Ref Range: 0 - 37 U/L 19  ALT Latest Ref Range: 0 - 35 U/L 14  Total Protein Latest Ref Range: 6.0 - 8.3 g/dL 6.9  Total Bilirubin Latest Ref Range: 0.2 - 1.2 mg/dL 0.6  GFR Latest Ref Range: >60.00 mL/min 99.30  Total CHOL/HDL Ratio Unknown 2  Cholesterol Latest Ref Range: 0 - 200 mg/dL 737  HDL Cholesterol Latest Ref Range: >39.00 mg/dL 10.62  LDL (calc) Latest Ref Range: 0 - 99 mg/dL 45  NonHDL Unknown 69.48  Triglycerides Latest Ref Range: 0.0 - 149.0 mg/dL 54.6  VLDL Latest Ref Range: 0.0 - 40.0 mg/dL 27.0  TSH Latest Ref Range: 0.35 - 4.50 uIU/mL 0.45     03/25/2019 Tg 140 LDL 101 A1c 8.8 %     ASSESSMENT / PLAN / RECOMMENDATIONS:   1) Type 2 Diabetes Mellitus, Optimally controlled, With Neuropathic  complications - Most recent A1c of 5.4 %. Goal A1c < 7.0 %.    - I have praised the pt on optimizing glucose control without hypoglycemia  - She is planning on having weight loss surgery by the spring of 2022 - Unfortunately due to reported personal history of pancreatitis , DPP-4 inhibitors and GLP-1 agonists are CONTRAINDICATED - She is intolerant to metformin and SGLT-2 inhibitors.  - With a low A1c, and to prevent hypoglycemia, will reduce insulin as below , she was advised to HOLD prandial insulin when she started the Liver cleanse but continue to use correction scale if needed  - She was advised to contact us with BG's < 80 mg/dL so we could adjust her basal insulin    MEDICATIONS:  Continue Lantus 72 units daily   Decrease Novolog 14 units daily   CF: Novolog ( BG- 130/25)   EDUCATION / INSTRUCTIONS:  BG monitoring instructions: Patient is instructed to check her blood sugars 3 times a day, before meals .  Call [facility] if: BG persistently < 70  . I reviewed the Rule of 15 for the treatment of hypoglycemia in detail with the patient. Literature  supplied.    2) Diabetic complications:   Eye: Does not have known diabetic retinopathy.   Neuro/ Feet: Does have known diabetic peripheral neuropathy.  Renal: Patient does not have known baseline CKD. She is on an ACEI/ARB at present.Check urine albumin/creatinine ratio yearly starting at time of diagnosis.      3) Postablative Hypothyroidism :  - Pt is clinically euthyroid  - No local neck symptoms - TSH is normal on today's labs    Medication : Levothyroxine 125 mcg daily     4) Dyslipidemia:   - She is intolerant to Atorvastatin,  Simvastatin, Rosuvastatin and Zetia with Myalgias . She has been on Repatha since 05/2020 - LDL at goal   Medication   Continue Repatha 140 mg Johnson Q 2 weeks      F/U in 4 months    Signed electronically by: Lyndle Herrlich, MD  [facility]  [facility] [address] [place] [place], [place] [address] Phone: [phone] FAX: [phone]   CC: [facility] [address] [place] [address] Phone: [phone]  Fax: [phone]  Return to Endocrinology clinic as below: Future Appointments  Date Time Provider Department Center  12/30/2020 10:50 AM Charlton Boule, Konrad Dolores, MD [id number] None

## 2020-08-26 NOTE — Patient Instructions (Addendum)
BEFORE THE CLEANSE:  - Continue Lantus 72 units DAILY  - Continue Novolog 14  units with EACH meal - Novolog correctional insulin: ADD extra units on insulin to your meal-time Novolog dose if your blood sugars are higher than 155. Use the scale below to help guide you:   Blood sugar before meal Number of units to inject  Less than 155 0 unit  156 -  180 1 units  181 -  205 2 units  206 -  230 3 units  231-  255 4 units  256 -  280 5 units  281 -  305 6 units  306 -  330 7 units  331-  355 8 units    WHEN YOU START THE CLEANSE:   - STOP Novolog 14 units with each meal but continue to use the correction scale if needed as below  - Continue Lantus 72 units daily  - Let me know if your sugars are constantly  In the 80's or less so we can decrease the Lantus dose      HOW TO TREAT LOW BLOOD SUGARS (Blood sugar LESS THAN 70 MG/DL)  Please follow the RULE OF 15 for the treatment of hypoglycemia treatment (when your (blood sugars are less than 70 mg/dL)    STEP 1: Take 15 grams of carbohydrates when your blood sugar is low, which includes:   3-4 GLUCOSE TABS  OR  3-4 OZ OF JUICE OR REGULAR SODA OR  ONE TUBE OF GLUCOSE GEL     STEP 2: RECHECK blood sugar in 15 MINUTES STEP 3: If your blood sugar is still low at the 15 minute recheck --> then, go back to STEP 1 and treat AGAIN with another 15 grams of carbohydrates.

## 2020-08-30 ENCOUNTER — Telehealth: Payer: Self-pay | Admitting: Cardiology

## 2020-08-30 ENCOUNTER — Encounter: Payer: Self-pay | Admitting: Internal Medicine

## 2020-08-30 NOTE — Telephone Encounter (Signed)
   Primary Cardiologist: Garwin Brothers, MD  Chart reviewed as part of pre-operative protocol coverage. Given past medical history and time since last visit, based on ACC/AHA guidelines, Marie Chen would be at acceptable risk for the planned procedure without further cardiovascular testing.   I will route this recommendation to the requesting party via Epic fax function and remove from pre-op pool.  Please call with questions.  Thomasene Ripple. Cleaver NP-C    08/30/2020, 2:51 PM [facility] [address] Office [phone] Fax [phone]

## 2020-08-30 NOTE — Telephone Encounter (Signed)
   [facility] Pre-operative Risk Assessment    Request for surgical clearance:  1. What type of surgery is being performed?  Sleeve gastrectomy   2. When is this surgery scheduled? TBD  3. What type of clearance is required (medical clearance vs. Pharmacy clearance to hold med vs. Both)? Cardiac clearance  4. Are there any medications that need to be held prior to surgery and how long? none  5. Practice name and name of physician performing surgery? Dr.Andrew White of [facility]   6. What is your office phone number [phone]   7.   What is your office fax number [phone]  8.   Anesthesia type (None, local, MAC, general) ? General   Milbert Coulter 08/30/2020, 2:12 PM  _________________________________________________________________   (provider comments below)

## 2020-09-28 ENCOUNTER — Ambulatory Visit: Payer: Medicare HMO | Admitting: Internal Medicine

## 2020-09-29 ENCOUNTER — Encounter: Payer: Self-pay | Admitting: Internal Medicine

## 2020-10-07 ENCOUNTER — Ambulatory Visit: Payer: Medicare HMO | Admitting: Internal Medicine

## 2020-10-07 ENCOUNTER — Other Ambulatory Visit: Payer: Self-pay

## 2020-10-07 VITALS — BP 126/80 | HR 70 | Ht 68.0 in | Wt 286.0 lb

## 2020-10-07 DIAGNOSIS — E890 Postprocedural hypothyroidism: Secondary | ICD-10-CM | POA: Diagnosis not present

## 2020-10-07 DIAGNOSIS — E1159 Type 2 diabetes mellitus with other circulatory complications: Secondary | ICD-10-CM | POA: Diagnosis not present

## 2020-10-07 DIAGNOSIS — E119 Type 2 diabetes mellitus without complications: Secondary | ICD-10-CM

## 2020-10-07 DIAGNOSIS — E1142 Type 2 diabetes mellitus with diabetic polyneuropathy: Secondary | ICD-10-CM

## 2020-10-07 DIAGNOSIS — Z794 Long term (current) use of insulin: Secondary | ICD-10-CM

## 2020-10-07 HISTORY — DX: Type 2 diabetes mellitus without complications: E11.9

## 2020-10-07 NOTE — Progress Notes (Addendum)
Name: Marie Chen  Age/ Sex: 60 y.o., female   MRN/ DOB: [id number], [birth year]     PCP: [facility]   Reason for Endocrinology Evaluation: Type 2 Diabetes Mellitus  Initial Endocrine Consultative Visit: 10/19/2019    PATIENT IDENTIFIER: [name] is a 60 y.o. 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 The patient has followed with Endocrinology clinic since 10/19/2019 for consultative assistance with management of her diabetes.  DIABETIC HISTORY:  Ms. Strum was diagnosed with DM in 2015. Pt endorses genital skin irritation secondary to Metformin ?Marcelline Deist caused genital infections. Her hemoglobin A1c has ranged from 8.8% in 03/2019 , peaking at 10.5% in 2021.  On her initial visit to  Our clinic her A1c 9.0% . We adjust MDI regimen     THYROID HISTORY:  In 1994 she had RAI ablation secondary to hyperthyroidism. She needed LT-4 replacement shortly after RAI ablation .    Mother with cushing syndrome    S/P sleeve gastrectomy 10/03/2020 SUBJECTIVE:   During the last visit (08/27/2019): A1c 5.4 % . Adjusted MDI regimen    Today (10/07/2020): [name] is here for a follow up on diabetes management.  She checks her blood sugars 2-3 times daily, preprandial. The patient has not had hypoglycemic episodes since the last clinic visit.    S/P gastric sleeve sx 10/03/2020 She is currently on full liquid diet , will transition to pureed diet on 5/11th  Has slight nausea but no vomiting or diarrhea  Denies fever     HOME ENDOCRINE REGIMEN:  Lantus 72 units daily - not taking  Novolog 14 units TIDQAC - not aking  CF : Novolog (BG-130/25) - not taking  Levothyroxine 125 mcg daily     Statin: yes ACE-I/ARB: yes    METER DOWNLOAD SUMMARY: Date range evaluated: 4/15-4/29/2022 Average Number Tests/Day = 1.7 Overall Mean FS  Glucose = 85   BG Ranges: Low = 64 High = 104   Hypoglycemic Events/30 Days: BG < 50 = 0 Episodes of symptomatic severe hypoglycemia = 0    DIABETIC COMPLICATIONS: Microvascular complications:   Neuropathy   Denies: CKD, retinopathy   Last eye exam: Completed 01/26/2020  Macrovascular complications:   CAD ( medically treated per pt)   Denies: CAD, PVD, CVA   HISTORY:  Past Medical History:  Past Medical History:  Diagnosis Date  . Acute hypoxemic respiratory failure due to severe acute respiratory syndrome coronavirus 2 (SARS-CoV-2) disease (HCC) 06/18/2019  . Acute pharyngitis 05/23/2009   Qualifier: Diagnosis of  By: Felicity Coyer MD, Raenette Rover Allergic rhinitis 03/26/2007   Qualifier: Diagnosis of  By: Jonny Ruiz MD, Len Blalock   Formatting of this note might be different from the original. Overview:  Qualifier: Diagnosis of  By: Jonny Ruiz MD, Len Blalock  . Asthma    severe  . ASTHMA NOS W/ACUTE EXACERBATION 03/26/2007   Qualifier: Diagnosis of  By: Jonny Ruiz MD, Len Blalock   . Asthma with exacerbation 03/26/2007   Formatting of this note might be different from the original. Overview:  Qualifier: Diagnosis of  By: Jonny Ruiz MD, Len Blalock  . ASTHMATIC BRONCHITIS, ACUTE 04/27/2008   Qualifier: Diagnosis of  By: Jonny Ruiz MD, Len Blalock   . Bronchitis, chronic (HCC) 04/22/2008   Qualifier: Diagnosis of  By: Thurnell Lose of this note might be different from the  original. Overview:  Qualifier: Diagnosis of  By: Alesia Richards  . CAD (coronary artery disease) 01/01/2019  . Carpal tunnel syndrome of left wrist 12/30/2019  . Chronic obstructive pulmonary disease (HCC) 06/19/2019  . Chronic pain syndrome 04/25/2017  . Cigarette smoker 05/04/2019  . Community acquired pneumonia 01/15/2020  . COPD (chronic obstructive pulmonary disease) (HCC)    patient denies this dx  . Coronary artery disease   . Cough with hemoptysis 05/11/2018  . Depression   . DEPRESSION 01/05/2007   Qualifier:  Diagnosis of  By: Jonny Ruiz MD, Len Blalock   . Diabetes mellitus due to underlying condition with unspecified complications (HCC) 03/26/2007   Qualifier: Diagnosis of  By: Jonny Ruiz MD, Len Blalock   . Diabetes mellitus type 2 in obese (HCC) 05/12/2018  . Diabetes mellitus without complication (HCC)    type 2  . DSORD CIRCADIAN RHY SHFT WRK SLEEP PHASE TYPE 03/26/2007   Qualifier: Diagnosis of  By: Jonny Ruiz MD, Len Blalock   Formatting of this note might be different from the original. Overview:  Qualifier: Diagnosis of  By: Jonny Ruiz MD, Len Blalock  . Dyslipidemia 10/19/2019  . DYSPHAGIA 04/27/2008   Qualifier: Diagnosis of  By: Marina Goodell MD, Wilhemina Bonito   . Dyspnea    with exertion  . Essential hypertension 01/05/2007   Qualifier: Diagnosis of  By: Jonny Ruiz MD, Len Blalock   . Fatty liver   . Fibromyalgia   . GERD 01/05/2007   Qualifier: Diagnosis of  By: Jonny Ruiz MD, Len Blalock   . GERD (gastroesophageal reflux disease)   . HAMMER TOE 01/05/2007   Qualifier: Diagnosis of  By: Jonny Ruiz MD, Len Blalock   . Headache 02/24/2009   Qualifier: Diagnosis of  By: Jonny Ruiz MD, Len Blalock  . HEADACHE 02/24/2009   Qualifier: Diagnosis of  By: Jonny Ruiz MD, Len Blalock   . Hemoptysis 05/11/2018  . HLD (hyperlipidemia)    diet controlled  . Hyperlipidemia 01/05/2007   diet controlled Formatting of this note might be different from the original. Overview:  Qualifier: Diagnosis of  By: Jonny Ruiz MD, Len Blalock  . Hypertension   . Hypothyroidism   . Inflammatory arthritis 07/22/2020  . IRRITABLE BOWEL SYNDROME, HX OF 03/26/2007   Qualifier: Diagnosis of  By: Jonny Ruiz MD, Len Blalock   . Lumbar facet arthropathy 04/25/2017  . Lumbar radiculopathy 09/19/2017  . Migraine   . Migraine headache 04/22/2008   Formatting of this note might be different from the original. Overview:  Qualifier: Diagnosis of  By: Alesia Richards  . MIGRAINE, CHRONIC 04/22/2008   Qualifier: Diagnosis of  By: Alesia Richards    . Mixed dyslipidemia 01/05/2007   Qualifier: Diagnosis of  By: Jonny Ruiz MD, Len Blalock   .  Morbid obesity (HCC) 12/04/2018  . Morbid obesity with BMI of 50.0-59.9, adult (HCC) 09/23/2019  . Numbness 12/04/2019  . Old disruption of left lateral collateral ligament 12/04/2019  . Osteoarthritis 07/22/2020  . Pain from implanted hardware 08/01/2017  . Pain in left shoulder 07/13/2020  . PEPTIC ULCER DISEASE 03/26/2007   Qualifier: Diagnosis of  By: Jonny Ruiz MD, Len Blalock   . PERSONAL HISTORY ENDOCRN METABOLIC&IMMUNITY D/O 04/22/2008   Qualifier: Diagnosis of  By: Alesia Richards    . Plantar callus 08/01/2017  . Plantar fasciitis 08/01/2017  . Pre-ulcerative corn or callous 01/30/2018  . Preop cardiovascular exam 01/22/2020  . Primary osteoarthritis of both hips 04/25/2017  . Primary osteoarthritis of both knees 04/25/2017  . RESTLESS LEG SYNDROME,  HX OF 03/26/2007   Qualifier: Diagnosis of  By: Jonny Ruiz MD, Len Blalock   . Segmental dysfunction of lumbar region 04/25/2017  . SINUSITIS- ACUTE-NOS 02/24/2009   Qualifier: Diagnosis of  By: Jonny Ruiz MD, Len Blalock   . Sjogren's disease [facility])   . Sleep apnea    CPAP nightly  . Sleep apnea, obstructive 01/05/2007   Qualifier: Diagnosis of  By: Jonny Ruiz MD, Len Blalock   Formatting of this note might be different from the original. Overview:  Qualifier: Diagnosis of  By: Jonny Ruiz MD, Len Blalock  . Thoracic spine pain 11/13/2017  . Thyroid disease   . Trigger finger, left little finger 12/30/2019  . Trochanteric bursitis of left hip 03/02/2018  . Type 2 diabetes mellitus with diabetic polyneuropathy, with long-term current use of insulin (HCC) 01/26/2020  . Type 2 diabetes mellitus with hyperglycemia, without long-term current use of insulin (HCC) 10/19/2019   Past Surgical History:  Past Surgical History:  Procedure Laterality Date  . ANKLE SURGERY  2010  . BACK SURGERY    . CARPAL TUNNEL RELEASE Bilateral   . CHOLECYSTECTOMY    . COLONOSCOPY  04/2014  . FOOT SURGERY     bunion surgery bialteral  . HYSTEROSCOPY    . LEFT HEART CATH AND CORONARY ANGIOGRAPHY N/A 12/19/2018    Procedure: LEFT HEART CATH AND CORONARY ANGIOGRAPHY;  Surgeon: Yvonne Kendall, MD;  Location: [facility];  Service: Cardiovascular;  Laterality: N/A;  . LIGAMENT REPAIR Left 02/04/2020   Procedure: REPAIR RADIAL COLLATERAL LIGAMENT METACARPAL PHALANGEAL LEFT SMALL FINGER; POSSIBLE PINNING;  Surgeon: Cindee Salt, MD;  Location: [facility] OR;  Service: Orthopedics;  Laterality: Left;  AXILLARY BLOCK  . RESECTION DISTAL CLAVICAL Right   . STERIOD INJECTION Left 02/04/2020   Procedure: INJECTION LEFT CARPAL TUNNEL;  Surgeon: Cindee Salt, MD;  Location: [facility] OR;  Service: Orthopedics;  Laterality: Left;  . TUBAL LIGATION    . UPPER GI ENDOSCOPY  04/2014  . WISDOM TOOTH EXTRACTION      Social History:  reports that she quit smoking about 15 months ago. Her smoking use included cigarettes. She has never used smokeless tobacco. She reports that she does not drink alcohol and does not use drugs. Family History:  Family History  Problem Relation Age of Onset  . Heart failure Father   . Colon cancer Neg Hx      HOME MEDICATIONS: Allergies as of 10/07/2020      Reactions   Macrodantin [nitrofurantoin Macrocrystal] Rash   Metformin And Related Other (See Comments)   Causes yeast infections   Atorvastatin Other (See Comments)   Hydromorphone Hcl Nausea And Vomiting   Lipitor [atorvastatin Calcium] Diarrhea   Meperidine Hcl Nausea Only   Is ok if given with Phenergan   Tetracyclines & Related Rash      Medication List       Accurate as of October 07, 2020 12:45 PM. If you have any questions, ask your nurse or doctor.        albuterol 108 (90 Base) MCG/ACT inhaler Commonly known as: VENTOLIN HFA Inhale 1-2 puffs into the lungs every 6 (six) hours as needed for wheezing or shortness of breath.   albuterol (2.5 MG/3ML) 0.083% nebulizer solution Commonly known as: PROVENTIL Take 2.5 mg by nebulization every 6 (six) hours as needed for wheezing or shortness of breath.   aspirin EC 81 MG  tablet Take 1 tablet (81 mg total) by mouth daily.   calcium carbonate 500 MG chewable  tablet Commonly known as: TUMS - dosed in mg elemental calcium Chew 2 tablets by mouth daily as needed for indigestion or heartburn.   escitalopram 20 MG tablet Commonly known as: LEXAPRO Take 20 mg by mouth at bedtime.   fluticasone 50 MCG/ACT nasal spray Commonly known as: FLONASE Place 1 spray into both nostrils daily as needed for allergies.   insulin glargine 100 unit/mL Sopn Commonly known as: LANTUS Inject 72 Units into the skin daily before breakfast.   levothyroxine 125 MCG tablet Commonly known as: SYNTHROID Take 125 mcg by mouth daily before breakfast.   lisinopril-hydrochlorothiazide 20-25 MG tablet Commonly known as: ZESTORETIC Take 1 tablet by mouth daily with breakfast.   LUBRICATING EYE DROPS OP Place 1 drop into both eyes daily as needed (dry eyes).   Magnesium 400 MG Tabs Take 400 mg by mouth daily.   montelukast 10 MG tablet Commonly known as: SINGULAIR Take 10 mg by mouth daily with breakfast.   naproxen sodium 220 MG tablet Commonly known as: ALEVE Take 440 mg by mouth daily as needed (pain).   nitroGLYCERIN 0.4 MG SL tablet Commonly known as: NITROSTAT Place 0.4 mg under the tongue every 5 (five) minutes as needed for chest pain.   NovoLOG FlexPen 100 UNIT/ML FlexPen Generic drug: insulin aspart Inject 18 Units into the skin 3 (three) times daily with meals. Max daily 90 units   OVER THE COUNTER MEDICATION Take 300 mg by mouth at bedtime as needed (sleep). CBD oil   oxyCODONE-acetaminophen 7.5-325 MG tablet Commonly known as: PERCOCET TAKE 1 TABLET BY MOUTH EVERY 6 HOURS AS NEEDED FOR CHRONIC PAIN. MAX 4 PER DAY   pregabalin 150 MG capsule Commonly known as: LYRICA Take 150 mg by mouth 3 (three) times daily.   Repatha 140 MG/ML Sosy Generic drug: Evolocumab Inject 140 mg into the skin every 14 (fourteen) days.   terconazole 0.4 % vaginal  cream Commonly known as: TERAZOL 7 Place 1 applicator vaginally at bedtime as needed (yeast infections).   topiramate 25 MG tablet Commonly known as: TOPAMAX Take 25 mg by mouth at bedtime.   Trelegy Ellipta 100-62.5-25 MCG/INH Aepb Generic drug: Fluticasone-Umeclidin-Vilant Inhale 1 puff into the lungs daily.   Vitamin D (Ergocalciferol) 1.25 MG (50000 UNIT) Caps capsule Commonly known as: DRISDOL Take 50,000 Units by mouth every Saturday.        OBJECTIVE:   Vital Signs: LMP  (LMP Unknown)   Wt Readings from Last 3 Encounters:  08/26/20 (!) 308 lb 4 oz (139.8 kg)  08/16/20 (!) 311 lb (141.1 kg)  07/25/20 (!) 311 lb 3.2 oz (141.2 kg)     Exam: General: Pt appears well and is in NAD  Lungs: Clear with good BS bilat   Heart: RRR   Extremities: No pretibial edema.   Neuro: MS is good with appropriate affect, pt is alert and Ox3    DM foot exam: 10/19/2019 The skin of the feet is without sores or ulcerations but has thickened toe nails and plantar formation noted B/L  The pedal pulses are 2+ on right and 2+ on left. The sensation is decreased  to a screening 5.07, 10 gram monofilament bilaterally   DATA REVIEWED:  Lab Results  Component Value Date   HGBA1C 5.4 08/26/2020   HGBA1C 6.0 (A) 05/26/2020   HGBA1C 8.0 (A) 01/25/2020   Results for AIREONNA, BAUER (MRN [id number]) as of 08/29/2020 09:00  Ref. Range 08/26/2020 10:59  Sodium Latest Ref Range: 135 - 145 mEq/L  139  Potassium Latest Ref Range: 3.5 - 5.1 mEq/L 3.8  Chloride Latest Ref Range: 96 - 112 mEq/L 106  CO2 Latest Ref Range: 19 - 32 mEq/L 26  Glucose Latest Ref Range: 70 - 99 mg/dL 244 (H)  BUN Latest Ref Range: 6 - 23 mg/dL 20  Creatinine Latest Ref Range: 0.40 - 1.20 mg/dL 0.10  Calcium Latest Ref Range: 8.4 - 10.5 mg/dL 9.6  Alkaline Phosphatase Latest Ref Range: 39 - 117 U/L 85  Albumin Latest Ref Range: 3.5 - 5.2 g/dL 3.9  AST Latest Ref Range: 0 - 37 U/L 19  ALT Latest Ref Range: 0 - 35  U/L 14  Total Protein Latest Ref Range: 6.0 - 8.3 g/dL 6.9  Total Bilirubin Latest Ref Range: 0.2 - 1.2 mg/dL 0.6  GFR Latest Ref Range: >60.00 mL/min 99.30  Total CHOL/HDL Ratio Unknown 2  Cholesterol Latest Ref Range: 0 - 200 mg/dL 272  HDL Cholesterol Latest Ref Range: >39.00 mg/dL 53.66  LDL (calc) Latest Ref Range: 0 - 99 mg/dL 45  NonHDL Unknown 44.03  Triglycerides Latest Ref Range: 0.0 - 149.0 mg/dL 47.4  VLDL Latest Ref Range: 0.0 - 40.0 mg/dL 25.9  TSH Latest Ref Range: 0.35 - 4.50 uIU/mL 0.45    ASSESSMENT / PLAN / RECOMMENDATIONS:   1) Type 2 Diabetes Mellitus, Optimally controlled, With Neuropathic and macrovascular   complications - Most recent A1c of 5.4 %. Goal A1c < 7.0 %.     - S/P sleeve gastrectomy on 10/03/2020 . Currently on full liquid diet and will be transitioning to pureed soon.  - BG's have been optimal - Will stop Basal insulin and will provide her with a correction scale of Novolog if BG > 175 mg/dL  - Has a history of pancreatitis , DPP-4 inhibitors and GLP-1 agonists are CONTRAINDICATED - She is intolerant to metformin and SGLT-2 inhibitors.    MEDICATIONS:  STOP Lantus   Stop standing dose of Novolog   CF: Novolog ( BG- 130/45)   EDUCATION / INSTRUCTIONS:  BG monitoring instructions: Patient is instructed to check her blood sugars 3 times a day, before meals .  Call [facility] if: BG persistently < 70  . I reviewed the Rule of 15 for the treatment of hypoglycemia in detail with the patient. Literature supplied.    2) Diabetic complications:   Eye: Does not have known diabetic retinopathy.   Neuro/ Feet: Does have known diabetic peripheral neuropathy.  Renal: Patient does not have known baseline CKD. She is on an ACEI/ARB at present.Check urine albumin/creatinine ratio yearly starting at time of diagnosis.      3) Postablative Hypothyroidism :  - Pt is clinically euthyroid  - No local neck symptoms - TSH is  normal last month , she understands with weight loss the dose may have to be changes, its too soon to check today, will recheck in 6 weeks    Medication : Levothyroxine 125 mcg daily     4) Dyslipidemia:   - She is intolerant to Atorvastatin, Simvastatin, Rosuvastatin and Zetia with Myalgias . She has been on Repatha since 05/2020 - LDL at goal   Medication   Continue Repatha 140 mg [place] Q 2 weeks      F/U in 3 months  Will check TSH in ~ 6 weeks    Signed electronically by: Lyndle Herrlich, MD  [facility] Endocr[facility]ology  [facility] [address] [place], [place] [address] Phone: [phone]  FAX: [phone]   CC: [facility] [address] [place] [address] Phone: [phone]  Fax: [phone]  Return to Endocrinology clinic as below: Future Appointments  Date Time Provider Department Center  10/07/2020  1:00 PM Shavonn Convey, Konrad DoloresIbtehal Jaralla, MD [facility] None  12/30/2020 10:50 AM Evelise Reine, Konrad DoloresIbtehal Jaralla, MD [facility] None

## 2020-10-07 NOTE — Patient Instructions (Addendum)
-   STOP Lantus  - Novolog correctional insulin: Use the scale below to help guide you before meals if needed   Blood sugar before meal Number of units to inject  Less than 175 0 unit  176 -  220 1 units  221 -  265 2 units  266 -  310 3 units  311-  355 4 units    WHEN YOU START THE CLEANSE:   - STOP Novolog 14 units with each meal but continue to use the correction scale if needed as below  - Continue Lantus 72 units daily  - Let me know if your sugars are constantly  In the 80's or less so we can decrease the Lantus dose      HOW TO TREAT LOW BLOOD SUGARS (Blood sugar LESS THAN 70 MG/DL)  Please follow the RULE OF 15 for the treatment of hypoglycemia treatment (when your (blood sugars are less than 70 mg/dL)    STEP 1: Take 15 grams of carbohydrates when your blood sugar is low, which includes:   3-4 GLUCOSE TABS  OR  3-4 OZ OF JUICE OR REGULAR SODA OR  ONE TUBE OF GLUCOSE GEL     STEP 2: RECHECK blood sugar in 15 MINUTES STEP 3: If your blood sugar is still low at the 15 minute recheck --> then, go back to STEP 1 and treat AGAIN with another 15 grams of carbohydrates.

## 2020-10-12 DIAGNOSIS — Z9884 Bariatric surgery status: Secondary | ICD-10-CM | POA: Insufficient documentation

## 2020-10-12 HISTORY — DX: Bariatric surgery status: Z98.84

## 2020-11-18 ENCOUNTER — Other Ambulatory Visit: Payer: Self-pay

## 2020-11-18 ENCOUNTER — Other Ambulatory Visit (INDEPENDENT_AMBULATORY_CARE_PROVIDER_SITE_OTHER): Payer: Medicare HMO

## 2020-11-18 DIAGNOSIS — E1142 Type 2 diabetes mellitus with diabetic polyneuropathy: Secondary | ICD-10-CM | POA: Diagnosis not present

## 2020-11-18 DIAGNOSIS — Z794 Long term (current) use of insulin: Secondary | ICD-10-CM

## 2020-11-18 LAB — TSH: TSH: 0.03 u[IU]/mL — ABNORMAL LOW (ref 0.35–4.50)

## 2020-11-21 ENCOUNTER — Encounter: Payer: Self-pay | Admitting: Internal Medicine

## 2020-11-21 MED ORDER — LEVOTHYROXINE SODIUM 112 MCG PO TABS: 112.0000 ug | ORAL_TABLET | Freq: Every day | ORAL | 3 refills | Status: DC

## 2020-12-04 ENCOUNTER — Other Ambulatory Visit: Payer: Self-pay | Admitting: Internal Medicine

## 2020-12-05 NOTE — Telephone Encounter (Signed)
Please advise 

## 2020-12-13 ENCOUNTER — Telehealth: Payer: Self-pay | Admitting: Internal Medicine

## 2020-12-13 NOTE — Telephone Encounter (Signed)
Kaitlyn with [facility] (used to be [facility]) requests to be called at ph# [phone] re: statin therapy for Patient

## 2020-12-15 ENCOUNTER — Telehealth: Payer: Self-pay | Admitting: Internal Medicine

## 2020-12-15 ENCOUNTER — Other Ambulatory Visit: Payer: Self-pay

## 2020-12-15 NOTE — Telephone Encounter (Signed)
MEDICATION: Alcohol Pads  PHARMACY:   [facility] (Now [facility]) - [place], [place] - [address] Phone:  [phone]  Fax:  [phone]      HAS THE PATIENT CONTACTED THEIR PHARMACY?  no  IS THIS A 90 DAY SUPPLY : yes  IS PATIENT OUT OF MEDICATION:  IF NOT; HOW MUCH IS LEFT:   LAST APPOINTMENT DATE: @7 /10/2020  NEXT APPOINTMENT DATE:@7 /22/2022  DO WE HAVE YOUR PERMISSION TO LEAVE A DETAILED MESSAGE?: yes      **Let patient know to contact pharmacy at the end of the day to make sure medication is ready. **  ** Please notify patient to allow 48-72 hours to process**  **Encourage patient to contact the pharmacy for refills or they can request refills through [facility]**

## 2020-12-16 ENCOUNTER — Other Ambulatory Visit: Payer: Self-pay | Admitting: *Deleted

## 2020-12-20 ENCOUNTER — Telehealth: Payer: Self-pay | Admitting: Internal Medicine

## 2020-12-20 ENCOUNTER — Other Ambulatory Visit: Payer: Self-pay

## 2020-12-20 MED ORDER — ALCOHOL PADS 70 % PADS: MEDICATED_PAD | 3 refills | Status: DC

## 2020-12-20 NOTE — Telephone Encounter (Signed)
[facility] pharmacy called to check to see if pt has tried a statin medication prior to repatha. She states usually prescribers try to prescribe statins before using repatha so she was just asking for that clarification.    Please contact: [phone] Joyice Faster

## 2020-12-21 NOTE — Telephone Encounter (Signed)
Spoke with pharmacy Tech regarding pts statin.

## 2020-12-21 NOTE — Telephone Encounter (Signed)
Spoke with the pharmacy Tech regarding pt's allergy to statin meds.

## 2020-12-30 ENCOUNTER — Other Ambulatory Visit: Payer: Self-pay

## 2020-12-30 ENCOUNTER — Encounter: Payer: Self-pay | Admitting: Internal Medicine

## 2020-12-30 ENCOUNTER — Other Ambulatory Visit (INDEPENDENT_AMBULATORY_CARE_PROVIDER_SITE_OTHER): Payer: Medicare HMO

## 2020-12-30 ENCOUNTER — Ambulatory Visit (INDEPENDENT_AMBULATORY_CARE_PROVIDER_SITE_OTHER): Payer: Medicare HMO | Admitting: Internal Medicine

## 2020-12-30 VITALS — BP 124/86 | HR 58 | Ht 67.0 in | Wt 256.0 lb

## 2020-12-30 DIAGNOSIS — E890 Postprocedural hypothyroidism: Secondary | ICD-10-CM | POA: Diagnosis not present

## 2020-12-30 DIAGNOSIS — Z794 Long term (current) use of insulin: Secondary | ICD-10-CM

## 2020-12-30 DIAGNOSIS — E1142 Type 2 diabetes mellitus with diabetic polyneuropathy: Secondary | ICD-10-CM

## 2020-12-30 LAB — POCT GLYCOSYLATED HEMOGLOBIN (HGB A1C): Hemoglobin A1C: 6 % — AB (ref 4.0–5.6)

## 2020-12-30 LAB — TSH: TSH: 0.08 u[IU]/mL — ABNORMAL LOW (ref 0.35–5.50)

## 2020-12-30 MED ORDER — LEVOTHYROXINE SODIUM 88 MCG PO TABS: 88.0000 ug | ORAL_TABLET | Freq: Every day | ORAL | 3 refills | Status: DC

## 2020-12-30 NOTE — Progress Notes (Signed)
Name: Marie Chen  Age/ Sex: 60 y.o., female   MRN/ DOB: [id number], 26-Feb-1961     [facility]P: [facility]   Reason for Endocrinology Evaluation: Type 2 Diabetes Mellitus  Initial Endocrine Consultative Visit: 10/19/2019    PATIENT IDENTIFIER: Marie Chen is a 60 y.o. 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 The patient has followed with Endocrinology clinic since 10/19/2019 for consultative assistance with management of her diabetes.  DIABETIC HISTORY:  Marie Chen was diagnosed with DM in 2015. Pt endorses genital skin irritation secondary to Metformin ?Marcelline Deist caused genital infections. Her hemoglobin A1c has ranged from 8.8% in 03/2019 , peaking at 10.5% in 2021.  On her initial visit to  Our clinic her A1c 9.0% . We adjust MDI regimen     THYROID HISTORY:  In 1994 she had RAI ablation secondary to hyperthyroidism. She needed LT-4 replacement shortly after RAI ablation .    Mother with cushing syndrome    S/P sleeve gastrectomy 10/03/2020, her A1c was 5.4 % and we stopped basal insulin and was only provided with novolog per correction scale    SUBJECTIVE:   During the last visit (08/27/2019): A1c 5.4 % . we stopped basal insulin and was only provided with novolog per correction scale    Today (12/30/2020): Marie Chen is here for a follow up on diabetes management.  She checks her blood sugars 2-3 times daily, preprandial. The patient has not had hypoglycemic episodes since the last clinic visit.    S/P gastric sleeve sx 10/03/2020 She is on regular food Has occasional mid right back pain as well as sacral pain, pending x-rays  Has rare abdominal cramps but no nausea  She she has been feeling depressed and anxious with stress   HOME ENDOCRINE REGIMEN:  CF: Novolog ( BG- 130/45) - not needed  Levothyroxine 112 mcg daily      Statin: yes ACE-I/ARB: yes    METER DOWNLOAD SUMMARY: Date range evaluated: 7/8-7/22/2022 Average Number Tests/Day = 1.7 Overall Mean FS Glucose = 119   BG Ranges: Low = 87 High = 208   Hypoglycemic Events/30 Days: BG < 50 = 0 Episodes of symptomatic severe hypoglycemia = 0    DIABETIC COMPLICATIONS: Microvascular complications:  Neuropathy  Denies: CKD, retinopathy  Last eye exam: Completed 01/26/2020   Macrovascular complications:  CAD ( medically treated per pt)  Denies: CAD, PVD, CVA   HISTORY:  Past Medical History:  Past Medical History:  Diagnosis Date   Acute hypoxemic respiratory failure due to severe acute respiratory syndrome coronavirus 2 (SARS-CoV-2) disease (HCC) 06/18/2019   Acute pharyngitis 05/23/2009   Qualifier: Diagnosis of  By: Felicity Coyer MD, Vikki Ports A    Allergic rhinitis 03/26/2007   Qualifier: Diagnosis of  By: Jonny Ruiz MD, Len Blalock   Formatting of this note might be different from the original. Overview:  Qualifier: Diagnosis of  By: Jonny Ruiz MD, Len Blalock   Asthma    severe   ASTHMA NOS W/ACUTE EXACERBATION 03/26/2007   Qualifier: Diagnosis of  By: Jonny Ruiz MD, Len Blalock    Asthma with exacerbation 03/26/2007   Formatting of this note might be different from the original. Overview:  Qualifier: Diagnosis of  By: Jonny Ruiz MD, Len Blalock   ASTHMATIC BRONCHITIS, ACUTE 04/27/2008   Qualifier: Diagnosis of  By: Jonny Ruiz MD, Len Blalock    Bronchitis, chronic (HCC) 04/22/2008  Qualifier: Diagnosis of  By: Thurnell Lose of this note might be different from the original. Overview:  Qualifier: Diagnosis of  By: Kem Boroughs, Britta Mccreedy   CAD (coronary artery disease) 01/01/2019   Carpal tunnel syndrome of left wrist 12/30/2019   Chronic obstructive pulmonary disease (HCC) 06/19/2019   Chronic pain syndrome 04/25/2017   Cigarette smoker 05/04/2019   Community acquired pneumonia 01/15/2020   COPD (chronic obstructive pulmonary disease) (HCC)    patient denies this  dx   Coronary artery disease    Cough with hemoptysis 05/11/2018   Depression    DEPRESSION 01/05/2007   Qualifier: Diagnosis of  By: Jonny Ruiz MD, Len Blalock    Diabetes mellitus due to underlying condition with unspecified complications (HCC) 03/26/2007   Qualifier: Diagnosis of  By: Jonny Ruiz MD, Len Blalock    Diabetes mellitus type 2 in obese (HCC) 05/12/2018   Diabetes mellitus without complication (HCC)    type 2   DSORD CIRCADIAN RHY SHFT WRK SLEEP PHASE TYPE 03/26/2007   Qualifier: Diagnosis of  By: Jonny Ruiz MD, Len Blalock   Formatting of this note might be different from the original. Overview:  Qualifier: Diagnosis of  By: Jonny Ruiz MD, Len Blalock   Dyslipidemia 10/19/2019   DYSPHAGIA 04/27/2008   Qualifier: Diagnosis of  By: Marina Goodell MD, Wilhemina Bonito    Dyspnea    with exertion   Essential hypertension 01/05/2007   Qualifier: Diagnosis of  By: Jonny Ruiz MD, Len Blalock    Fatty liver    Fibromyalgia    GERD 01/05/2007   Qualifier: Diagnosis of  By: Jonny Ruiz MD, Len Blalock    GERD (gastroesophageal reflux disease)    HAMMER TOE 01/05/2007   Qualifier: Diagnosis of  By: Jonny Ruiz MD, Len Blalock    Headache 02/24/2009   Qualifier: Diagnosis of  By: Jonny Ruiz MD, Len Blalock   HEADACHE 02/24/2009   Qualifier: Diagnosis of  By: Jonny Ruiz MD, Len Blalock    Hemoptysis 05/11/2018   HLD (hyperlipidemia)    diet controlled   Hyperlipidemia 01/05/2007   diet controlled Formatting of this note might be different from the original. Overview:  Qualifier: Diagnosis of  By: Jonny Ruiz MD, Len Blalock   Hypertension    Hypothyroidism    Inflammatory arthritis 07/22/2020   IRRITABLE BOWEL SYNDROME, HX OF 03/26/2007   Qualifier: Diagnosis of  By: Jonny Ruiz MD, Len Blalock    Lumbar facet arthropathy 04/25/2017   Lumbar radiculopathy 09/19/2017   Migraine    Migraine headache 04/22/2008   Formatting of this note might be different from the original. Overview:  Qualifier: Diagnosis of  By: Veryl Speak, CHRONIC 04/22/2008   Qualifier: Diagnosis of  By: Alesia Richards      Mixed dyslipidemia 01/05/2007   Qualifier: Diagnosis of  By: Jonny Ruiz MD, Len Blalock    Morbid obesity (HCC) 12/04/2018   Morbid obesity with BMI of 50.0-59.9, adult (HCC) 09/23/2019   Numbness 12/04/2019   Old disruption of left lateral collateral ligament 12/04/2019   Osteoarthritis 07/22/2020   Pain from implanted hardware 08/01/2017   Pain in left shoulder 07/13/2020   PEPTIC ULCER DISEASE 03/26/2007   Qualifier: Diagnosis of  By: Jonny Ruiz MD, Len Blalock    PERSONAL HISTORY ENDOCRN METABOLIC&IMMUNITY D/O 04/22/2008   Qualifier: Diagnosis of  By: Kem Boroughs, Barbara     Plantar callus 08/01/2017   Plantar fasciitis 08/01/2017   Pre-ulcerative corn or callous 01/30/2018   Preop cardiovascular exam 01/22/2020  Primary osteoarthritis of both hips 04/25/2017   Primary osteoarthritis of both knees 04/25/2017   RESTLESS LEG SYNDROME, HX OF 03/26/2007   Qualifier: Diagnosis of  By: Jonny Ruiz MD, Len Blalock    Segmental dysfunction of lumbar region 04/25/2017   SINUSITIS- ACUTE-NOS 02/24/2009   Qualifier: Diagnosis of  By: Jonny Ruiz MD, Len Blalock    Sjogren's disease [facility])    Sleep apnea    CPAP nightly   Sleep apnea, obstructive 01/05/2007   Qualifier: Diagnosis of  By: Jonny Ruiz MD, Len Blalock   Formatting of this note might be different from the original. Overview:  Qualifier: Diagnosis of  By: Jonny Ruiz MD, Len Blalock   Thoracic spine pain 11/13/2017   Thyroid disease    Trigger finger, left little finger 12/30/2019   Trochanteric bursitis of left hip 03/02/2018   Type 2 diabetes mellitus with diabetic polyneuropathy, with long-term current use of insulin (HCC) 01/26/2020   Type 2 diabetes mellitus with hyperglycemia, without long-term current use of insulin (HCC) 10/19/2019   Past Surgical History:  Past Surgical History:  Procedure Laterality Date   ANKLE SURGERY  2010   BACK SURGERY     CARPAL TUNNEL RELEASE Bilateral    CHOLECYSTECTOMY     COLONOSCOPY  04/2014   FOOT SURGERY     bunion surgery bialteral   HYSTEROSCOPY     LEFT  HEART CATH AND CORONARY ANGIOGRAPHY N/A 12/19/2018   Procedure: LEFT HEART CATH AND CORONARY ANGIOGRAPHY;  Surgeon: Yvonne Kendall, MD;  Location: [facility];  Service: Cardiovascular;  Laterality: N/A;   LIGAMENT REPAIR Left 02/04/2020   Procedure: REPAIR RADIAL COLLATERAL LIGAMENT METACARPAL PHALANGEAL LEFT SMALL FINGER; POSSIBLE PINNING;  Surgeon: Cindee Salt, MD;  Location: [facility] OR;  Service: Orthopedics;  Laterality: Left;  AXILLARY BLOCK   RESECTION DISTAL CLAVICAL Right    STERIOD INJECTION Left 02/04/2020   Procedure: INJECTION LEFT CARPAL TUNNEL;  Surgeon: Cindee Salt, MD;  Location: [facility] OR;  Service: Orthopedics;  Laterality: Left;   TUBAL LIGATION     UPPER GI ENDOSCOPY  04/2014   WISDOM TOOTH EXTRACTION     Social History:  reports that she quit smoking about 18 months ago. Her smoking use included cigarettes. She has never used smokeless tobacco. She reports that she does not drink alcohol and does not use drugs. Family History:  Family History  Problem Relation Age of Onset   Heart failure Father    Colon cancer Neg Hx      HOME MEDICATIONS: Allergies as of 12/30/2020       Reactions   Macrodantin [nitrofurantoin Macrocrystal] Rash   Metformin And Related Other (See Comments)   Causes yeast infections   Atorvastatin Other (See Comments)   Hydromorphone Hcl Nausea And Vomiting   Lipitor [atorvastatin Calcium] Diarrhea   Meperidine Hcl Nausea Only   Is ok if given with Phenergan   Tetracyclines & Related Rash        Medication List        Accurate as of December 30, 2020 11:18 AM. If you have any questions, ask your nurse or doctor.          STOP taking these medications    insulin glargine 100 unit/mL Sopn Commonly known as: LANTUS Stopped by: Scarlette Shorts, MD   naproxen sodium 220 MG tablet Commonly known as: ALEVE Stopped by: Scarlette Shorts, MD   NovoLOG FlexPen 100 UNIT/ML FlexPen Generic drug: insulin aspart Stopped by: Scarlette Shorts, MD  TAKE these medications    Accu-Chek Aviva Plus test strip Generic drug: glucose blood USE AS INSTRUCTED TO TEST BLOOD SUGAR UP TO 3 TIMES DAILY   albuterol 108 (90 Base) MCG/ACT inhaler Commonly known as: VENTOLIN HFA Inhale 1-2 puffs into the lungs every 6 (six) hours as needed for wheezing or shortness of breath.   albuterol (2.5 MG/3ML) 0.083% nebulizer solution Commonly known as: PROVENTIL Take 2.5 mg by nebulization every 6 (six) hours as needed for wheezing or shortness of breath.   Alcohol Pads 70 % Pads Use as directed   aspirin EC 81 MG tablet Take 1 tablet (81 mg total) by mouth daily.   calcium carbonate 500 MG chewable tablet Commonly known as: TUMS - dosed in mg elemental calcium Chew 2 tablets by mouth daily as needed for indigestion or heartburn.   escitalopram 20 MG tablet Commonly known as: LEXAPRO Take 20 mg by mouth at bedtime.   fluticasone 50 MCG/ACT nasal spray Commonly known as: FLONASE Place 1 spray into both nostrils daily as needed for allergies.   levothyroxine 112 MCG tablet Commonly known as: SYNTHROID Take 1 tablet (112 mcg total) by mouth daily.   lisinopril-hydrochlorothiazide 20-25 MG tablet Commonly known as: ZESTORETIC Take 1 tablet by mouth daily with breakfast.   LUBRICATING EYE DROPS OP Place 1 drop into both eyes daily as needed (dry eyes).   Magnesium 400 MG Tabs Take 400 mg by mouth daily.   montelukast 10 MG tablet Commonly known as: SINGULAIR Take 10 mg by mouth daily with breakfast.   nitroGLYCERIN 0.4 MG SL tablet Commonly known as: NITROSTAT Place 0.4 mg under the tongue every 5 (five) minutes as needed for chest pain.   OVER THE COUNTER MEDICATION Take 300 mg by mouth at bedtime as needed (sleep). CBD oil   oxyCODONE-acetaminophen 7.5-325 MG tablet Commonly known as: PERCOCET TAKE 1 TABLET BY MOUTH EVERY 6 HOURS AS NEEDED FOR CHRONIC PAIN. MAX 4 PER DAY   pregabalin 150 MG  capsule Commonly known as: LYRICA Take 150 mg by mouth 3 (three) times daily.   Repatha 140 MG/ML Sosy Generic drug: Evolocumab Inject 140 mg into the skin every 14 (fourteen) days.   terconazole 0.4 % vaginal cream Commonly known as: TERAZOL 7 Place 1 applicator vaginally at bedtime as needed (yeast infections).   topiramate 25 MG tablet Commonly known as: TOPAMAX Take 25 mg by mouth at bedtime.   Trelegy Ellipta 100-62.5-25 MCG/INH Aepb Generic drug: Fluticasone-Umeclidin-Vilant Inhale 1 puff into the lungs daily.   Vitamin D (Ergocalciferol) 1.25 MG (50000 UNIT) Caps capsule Commonly known as: DRISDOL Take 50,000 Units by mouth every Saturday.         OBJECTIVE:   Vital Signs: BP 124/86   Pulse (!) 58   Ht  (1.702 m)   Wt 256 lb (116.1 kg)   LMP  (LMP Unknown)   PF 97 Chen/min   BMI 40.10 kg/m   Wt Readings from Last 3 Encounters:  12/30/20 256 lb (116.1 kg)  10/07/20 286 lb (129.7 kg)  08/26/20 (!) 308 lb 4 oz (139.8 kg)     Exam: General: Pt appears well and is in NAD  Lungs: Clear with good BS bilat   Heart: RRR   Extremities: No pretibial edema.   Neuro: MS is good with appropriate affect, pt is alert and Ox3    DM foot exam: 10/19/2019 The skin of the feet is without sores or ulcerations but has thickened toe nails and  plantar formation noted B/Chen  The pedal pulses are 2+ on right and 2+ on left. The sensation is decreased  to a screening 5.07, 10 gram monofilament bilaterally   DATA REVIEWED:  Lab Results  Component Value Date   HGBA1C 6.0 (A) 12/30/2020   HGBA1C 5.4 08/26/2020   HGBA1C 6.0 (A) 05/26/2020   Results for Marie Chen, Marie Chen (MRN [id number]) as of 08/29/2020 09:00  Ref. Range 08/26/2020 10:59  Sodium Latest Ref Range: 135 - 145 mEq/Chen 139  Potassium Latest Ref Range: 3.5 - 5.1 mEq/Chen 3.8  Chloride Latest Ref Range: 96 - 112 mEq/Chen 106  CO2 Latest Ref Range: 19 - 32 mEq/Chen 26  Glucose Latest Ref Range: 70 - 99 mg/dL 409112 (H)   BUN Latest Ref Range: 6 - 23 mg/dL 20  Creatinine Latest Ref Range: 0.40 - 1.20 mg/dL 8.110.54  Calcium Latest Ref Range: 8.4 - 10.5 mg/dL 9.6  Alkaline Phosphatase Latest Ref Range: 39 - 117 U/Chen 85  Albumin Latest Ref Range: 3.5 - 5.2 g/dL 3.9  AST Latest Ref Range: 0 - 37 U/Chen 19  ALT Latest Ref Range: 0 - 35 U/Chen 14  Total Protein Latest Ref Range: 6.0 - 8.3 g/dL 6.9  Total Bilirubin Latest Ref Range: 0.2 - 1.2 mg/dL 0.6  GFR Latest Ref Range: >60.00 mL/min 99.30  Total CHOL/HDL Ratio Unknown 2  Cholesterol Latest Ref Range: 0 - 200 mg/dL 914105  HDL Cholesterol Latest Ref Range: >39.00 mg/dL 78.2944.60  LDL (calc) Latest Ref Range: 0 - 99 mg/dL 45  NonHDL Unknown 56.2160.17  Triglycerides Latest Ref Range: 0.0 - 149.0 mg/dL 30.874.0  VLDL Latest Ref Range: 0.0 - 40.0 mg/dL 65.714.8  TSH Latest Ref Range: 0.35 - 4.50 uIU/mL 0.45    ASSESSMENT / PLAN / RECOMMENDATIONS:   1) Type 2 Diabetes Mellitus, Optimally controlled, With Neuropathic and macrovascular   complications - Most recent A1c of 6.0 %. Goal A1c < 7.0 %.     - S/P sleeve gastrectomy on 10/03/2020 .  - BG's have been optimal  - Has a history of pancreatitis , DPP-4 inhibitors and GLP-1 agonists are CONTRAINDICATED - She is intolerant to metformin and SGLT-2 inhibitors.  - Off all insulin    MEDICATIONS: N/A    EDUCATION / INSTRUCTIONS: BG monitoring instructions: Patient is instructed to check her blood sugars 3 times a day, before meals . Call [facility] if: BG persistently < 70  I reviewed the Rule of 15 for the treatment of hypoglycemia in detail with the patient. Literature supplied.    2) Diabetic complications:  Eye: Does not have known diabetic retinopathy.  Neuro/ Feet: Does have known diabetic peripheral neuropathy. Renal: Patient does not have known baseline CKD. She is on an ACEI/ARB at present.Check urine albumin/creatinine ratio yearly starting at time of diagnosis.      3) Postablative  Hypothyroidism :  - Pt is clinically euthyroid  - No local neck symptoms -Her TSH remains low despite decreasing dose, will reduce the dose further down as below   Medication : Stop levothyroxine 112 mcg daily  Start levothyroxine 88 MCG daily   4) Dyslipidemia:   - She is intolerant to Atorvastatin, Simvastatin, Rosuvastatin and Zetia with Myalgias . She has been on Repatha since 05/2020 - LDL at goal   Medication   Continue Repatha 140 mg [place] Q 2 weeks   5) Thoracis spine pain : Right paraspinal tenderness. Pt to work on posture and use a heating pad  Most likely muskuloskeletal     F/U in 3 months  Will check TSH in ~ 6 weeks    Signed electronically by: Lyndle Herrlich, MD  [facility]  [facility] [address] [place] [place], [place] [address] Phone: [phone] FAX: [phone]   CC: [facility] [address] [place] [address] Phone: [phone]  Fax: [phone]  Return to Endocrinology clinic as below: Future Appointments  Date Time Provider Department Center  01/03/2021  9:00 AM Asencion Islam, DPM [facility]  02/02/2021 11:00 AM Revankar, Aundra Dubin, MD [facility] None

## 2020-12-30 NOTE — Patient Instructions (Signed)
Keep up the  Good work

## 2021-01-03 ENCOUNTER — Encounter: Payer: Self-pay | Admitting: Sports Medicine

## 2021-01-03 ENCOUNTER — Ambulatory Visit: Payer: Medicare HMO | Admitting: Sports Medicine

## 2021-01-03 ENCOUNTER — Ambulatory Visit (INDEPENDENT_AMBULATORY_CARE_PROVIDER_SITE_OTHER): Payer: Medicare HMO

## 2021-01-03 ENCOUNTER — Other Ambulatory Visit: Payer: Self-pay

## 2021-01-03 DIAGNOSIS — L84 Corns and callosities: Secondary | ICD-10-CM

## 2021-01-03 DIAGNOSIS — M21612 Bunion of left foot: Secondary | ICD-10-CM

## 2021-01-03 DIAGNOSIS — E0800 Diabetes mellitus due to underlying condition with hyperosmolarity without nonketotic hyperglycemic-hyperosmolar coma (NKHHC): Secondary | ICD-10-CM | POA: Diagnosis not present

## 2021-01-03 DIAGNOSIS — M21611 Bunion of right foot: Secondary | ICD-10-CM | POA: Diagnosis not present

## 2021-01-03 DIAGNOSIS — Z794 Long term (current) use of insulin: Secondary | ICD-10-CM

## 2021-01-03 NOTE — Progress Notes (Signed)
Subjective: Marie Chen is a 60 y.o. female patient who presents to office for evaluation of Right> Left foot pain secondary to callus skin at the big toe joints where she has bunions reports that she has previous bunion surgery on the left many years ago but reports that the right one seems like it is getting little worse over the last year with some redness swelling and numbness currently on nerve medicine of Lyrica. Patient is also diabetic with blood sugar 114 A1c 6 and last visit to PCP was in June.  No other pedal complaints noted.  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   Diabetes mellitus (HCC) 10/07/2020   Pre-operative cardiovascular examination 07/25/2020   Osteoarthritis 07/22/2020   Inflammatory arthritis 07/22/2020   Asthma    Coronary artery disease    Diabetes mellitus without complication (HCC)    Dyspnea    Fatty liver    Fibromyalgia    Sjogren's disease (HCC)    Sleep apnea    Thyroid disease    Pain in left shoulder 07/13/2020   Type 2 diabetes mellitus with diabetic polyneuropathy, with long-term current use of insulin (HCC) 01/26/2020   Preop cardiovascular exam 01/22/2020   Community acquired pneumonia 01/15/2020   Carpal tunnel syndrome of left wrist 12/30/2019   Trigger finger, left little finger 12/30/2019   Numbness 12/04/2019   Old disruption of left lateral collateral ligament 12/04/2019   Type 2 diabetes mellitus with hyperglycemia, without long-term current use of insulin (HCC) 10/19/2019   Dyslipidemia 10/19/2019   Morbid obesity with BMI of 50.0-59.9, adult (HCC) 09/23/2019   Chronic obstructive pulmonary disease (HCC) 06/19/2019   Hypertension    GERD (gastroesophageal reflux disease)    Acute hypoxemic respiratory failure due to severe acute respiratory syndrome coronavirus 2 (SARS-CoV-2) disease (HCC) 06/18/2019   CAD (coronary artery disease) 01/01/2019   Morbid obesity (HCC)  12/04/2018   Diabetes mellitus type 2 in obese (HCC) 05/12/2018   Cough with hemoptysis 05/11/2018   Hemoptysis 05/11/2018   Trochanteric bursitis of left hip 03/02/2018   Pre-ulcerative corn or callous 01/30/2018   Thoracic spine pain 11/13/2017   Lumbar radiculopathy 09/19/2017   Pain from implanted hardware 08/01/2017   Plantar callus 08/01/2017   Plantar fasciitis 08/01/2017   Chronic pain syndrome 04/25/2017   Lumbar facet arthropathy 04/25/2017   Primary osteoarthritis of both hips 04/25/2017   Primary osteoarthritis of both knees 04/25/2017   Segmental dysfunction of lumbar region 04/25/2017   ACUTE PHARYNGITIS 05/23/2009   SINUSITIS- ACUTE-NOS 02/24/2009   HEADACHE 02/24/2009   Headache 02/24/2009   ASTHMATIC BRONCHITIS, ACUTE 04/27/2008   DYSPHAGIA 04/27/2008   MIGRAINE, CHRONIC 04/22/2008   Bronchitis, chronic (HCC) 04/22/2008   PERSONAL HISTORY ENDOCRN METABOLIC&IMMUNITY D/O 04/22/2008   Migraine headache 04/22/2008   Diabetes mellitus due to underlying condition with unspecified complications (HCC) 03/26/2007   DSORD CIRCADIAN RHY SHFT WRK SLEEP PHASE TYPE 03/26/2007   Allergic rhinitis 03/26/2007   ASTHMA NOS W/ACUTE EXACERBATION 03/26/2007   PEPTIC ULCER DISEASE 03/26/2007   RESTLESS LEG SYNDROME, HX OF 03/26/2007   IRRITABLE BOWEL SYNDROME, HX OF 03/26/2007   Asthma with exacerbation 03/26/2007   Hypothyroidism 01/05/2007   Mixed dyslipidemia 01/05/2007   DEPRESSION 01/05/2007   Sleep apnea, obstructive 01/05/2007   Essential hypertension 01/05/2007   GERD 01/05/2007   HAMMER TOE 01/05/2007   Depression 01/05/2007   Hyperlipidemia 01/05/2007    Current Outpatient Medications on File Prior to  Visit  Medication Sig Dispense Refill   fluconazole (DIFLUCAN) 150 MG tablet Take by mouth.     ACCU-CHEK AVIVA PLUS test strip USE AS INSTRUCTED TO TEST BLOOD SUGAR UP TO 3 TIMES DAILY 300 strip 3   albuterol (PROVENTIL) (2.5 MG/3ML) 0.083% nebulizer solution Take  2.5 mg by nebulization every 6 (six) hours as needed for wheezing or shortness of breath.      albuterol (VENTOLIN HFA) 108 (90 Base) MCG/ACT inhaler Inhale 1-2 puffs into the lungs every 6 (six) hours as needed for wheezing or shortness of breath.     Alcohol Swabs (ALCOHOL PADS) 70 % PADS Use as directed 1 each 3   aspirin EC 81 MG tablet Take 1 tablet (81 mg total) by mouth daily. 90 tablet 3   calcium carbonate (TUMS - DOSED IN MG ELEMENTAL CALCIUM) 500 MG chewable tablet Chew 2 tablets by mouth daily as needed for indigestion or heartburn.     Carboxymethylcellul-Glycerin (LUBRICATING EYE DROPS OP) Place 1 drop into both eyes daily as needed (dry eyes).     cyclobenzaprine (FLEXERIL) 10 MG tablet 1 tablet 1 to 2 hours before bedtime     escitalopram (LEXAPRO) 20 MG tablet Take 20 mg by mouth at bedtime.      Evolocumab (REPATHA) 140 MG/ML SOSY Inject 140 mg into the skin every 14 (fourteen) days. 2.1 mL 11   fluticasone (FLONASE) 50 MCG/ACT nasal spray Place 1 spray into both nostrils daily as needed for allergies.      Fluticasone-Umeclidin-Vilant (TRELEGY ELLIPTA) 100-62.5-25 MCG/INH AEPB Inhale 1 puff into the lungs daily.      levothyroxine (SYNTHROID) 88 MCG tablet Take 1 tablet (88 mcg total) by mouth daily. 30 tablet 3   lisinopril-hydrochlorothiazide (PRINZIDE,ZESTORETIC) 20-25 MG tablet Take 1 tablet by mouth daily with breakfast. 30 tablet 0   Magnesium 400 MG TABS Take 400 mg by mouth daily.      montelukast (SINGULAIR) 10 MG tablet Take 10 mg by mouth daily with breakfast.     nitroGLYCERIN (NITROSTAT) 0.4 MG SL tablet Place 0.4 mg under the tongue every 5 (five) minutes as needed for chest pain.     nystatin (MYCOSTATIN/NYSTOP) powder 1 application     OVER THE COUNTER MEDICATION Take 300 mg by mouth at bedtime as needed (sleep). CBD oil     oxyCODONE-acetaminophen (PERCOCET) 7.5-325 MG tablet TAKE 1 TABLET BY MOUTH EVERY 6 HOURS AS NEEDED FOR CHRONIC PAIN. MAX 4 PER DAY      pregabalin (LYRICA) 150 MG capsule Take 150 mg by mouth 3 (three) times daily.     terconazole (TERAZOL 7) 0.4 % vaginal cream Place 1 applicator vaginally at bedtime as needed (yeast infections).      topiramate (TOPAMAX) 25 MG tablet Take 25 mg by mouth at bedtime.     Vitamin D, Ergocalciferol, (DRISDOL) 1.25 MG (50000 UT) CAPS capsule Take 50,000 Units by mouth every Saturday.     No current facility-administered medications on file prior to visit.    Allergies  Allergen Reactions   Macrodantin [Nitrofurantoin Macrocrystal] Rash   Metformin And Related Other (See Comments)    Causes yeast infections   Atorvastatin Other (See Comments)   Hydromorphone Hcl Nausea And Vomiting   Lipitor [Atorvastatin Calcium] Diarrhea   Meperidine Hcl Nausea Only    Is ok if given with Phenergan   Tetracyclines & Related Rash    Objective:  General: Alert and oriented x3 in no acute distress  Dermatology: Keratotic lesion  present medial first toe and first MPJ right greater than left and to right medial second toe with skin lines transversing the lesion, pain is present with direct pressure to the lesion with a central nucleated core noted, no webspace macerations, no ecchymosis bilateral, all nails x 10 are well manicured.  Vascular: Dorsalis Pedis and Posterior Tibial pedal pulses 1/4, Capillary Fill Time 3 seconds, + pedal hair growth bilateral, no edema bilateral lower extremities, Temperature gradient within normal limits.  Neurology: Michaell Cowing sensation intact via light touch bilateral.  Musculoskeletal: Mild tenderness with palpation at the keratotic lesion site on Right>Left, significant bunion with crossover deformity noted, muscular strength 5/5 in all groups without pain but there is limitation on range of motion first MPJs bilateral midfoot and ankle.  Pes planus foot type.  X-rays bilateral: Left foot ankle hardware intact, there is cerclage arch wiring noted at the proximal phalanx of the  first toe from previous history of bunion surgery there is diffuse arthritis bilateral at first MPJ midfoot ankle and subtalar joint with significant inferior and posterior heel spurs bilateral Assessment and Plan: Problem List Items Addressed This Visit       Endocrine   Diabetes mellitus (HCC)   Other Visit Diagnoses     Bilateral bunions    -  Primary   Relevant Orders   DG Foot Complete Right   DG Foot Complete Left   Callus           -Complete examination performed -X-rays reviewed -Discussed treatment options for bunions with underlying arthritis and callus -Parred keratoic lesion using a chisel x 4 using a 15 blade without incident -Dispensed bunion cushions for patient to use as directed -Encouraged daily skin emollients -Encouraged use of pumice stone -Advised good supportive shoes and recommend diabetic shoes and insoles patient to return to office for measurement appointment -Advised patient at this time her condition does not warrant any bunion surgery since there is very minimal pain to these areas advised patient that we will see how she does with better shoes and cushioning and further offloading however if symptoms progress we may revisit the idea of surgery however did make patient well aware that due to her age and diabetes there are inherent complications that could happen from bunion surgery so we would not want to do surgery unless there is significant pain -Patient to return to office as scheduled or sooner if condition worsens.  Asencion Islam, DPM

## 2021-01-04 ENCOUNTER — Other Ambulatory Visit: Payer: Self-pay | Admitting: Sports Medicine

## 2021-01-04 DIAGNOSIS — L84 Corns and callosities: Secondary | ICD-10-CM

## 2021-01-04 DIAGNOSIS — M21611 Bunion of right foot: Secondary | ICD-10-CM

## 2021-01-04 DIAGNOSIS — M21612 Bunion of left foot: Secondary | ICD-10-CM

## 2021-01-26 ENCOUNTER — Other Ambulatory Visit: Payer: Self-pay

## 2021-01-26 ENCOUNTER — Ambulatory Visit (INDEPENDENT_AMBULATORY_CARE_PROVIDER_SITE_OTHER): Payer: Medicare HMO | Admitting: *Deleted

## 2021-01-26 DIAGNOSIS — Z794 Long term (current) use of insulin: Secondary | ICD-10-CM

## 2021-01-26 DIAGNOSIS — M21612 Bunion of left foot: Secondary | ICD-10-CM

## 2021-01-26 DIAGNOSIS — M21611 Bunion of right foot: Secondary | ICD-10-CM

## 2021-01-26 DIAGNOSIS — E0800 Diabetes mellitus due to underlying condition with hyperosmolarity without nonketotic hyperglycemic-hyperosmolar coma (NKHHC): Secondary | ICD-10-CM

## 2021-01-26 NOTE — Progress Notes (Signed)
Patient presents to the office today for diabetic shoe and insole measuring.  Patient was measured with brannock device to determine size and width for 1 pair of extra depth shoes and foam casted for 3 pair of insoles.   Documentation of medical necessity will be sent to patient's treating diabetic doctor to verify and sign.   Patient's diabetic provider: Dr. Terrace Arabia  Shoes and insoles will be ordered at that time and patient will be notified for an appointment for fitting when they arrive.   Shoe size (per patient): Men's 8   Brannock measurement: RIGHT - 8.5 C, LEFT - 9 C  Patient shoe selection-   1st choice:   Marie Chen  2nd choice:  Marie Chen  Shoe size ordered: Women's 9 Medium

## 2021-02-01 ENCOUNTER — Other Ambulatory Visit: Payer: Self-pay

## 2021-02-02 ENCOUNTER — Encounter: Payer: Self-pay | Admitting: Cardiology

## 2021-02-02 ENCOUNTER — Other Ambulatory Visit: Payer: Self-pay

## 2021-02-02 ENCOUNTER — Ambulatory Visit: Payer: Medicare HMO | Admitting: Cardiology

## 2021-02-02 VITALS — BP 126/76 | HR 48 | Ht 67.0 in | Wt 251.2 lb

## 2021-02-02 DIAGNOSIS — I209 Angina pectoris, unspecified: Secondary | ICD-10-CM | POA: Diagnosis not present

## 2021-02-02 DIAGNOSIS — E782 Mixed hyperlipidemia: Secondary | ICD-10-CM

## 2021-02-02 DIAGNOSIS — I10 Essential (primary) hypertension: Secondary | ICD-10-CM | POA: Diagnosis not present

## 2021-02-02 DIAGNOSIS — R0789 Other chest pain: Secondary | ICD-10-CM | POA: Diagnosis not present

## 2021-02-02 DIAGNOSIS — I2510 Atherosclerotic heart disease of native coronary artery without angina pectoris: Secondary | ICD-10-CM

## 2021-02-02 DIAGNOSIS — E088 Diabetes mellitus due to underlying condition with unspecified complications: Secondary | ICD-10-CM

## 2021-02-02 MED ORDER — NITROGLYCERIN 0.4 MG SL SUBL: 0.4000 mg | SUBLINGUAL_TABLET | SUBLINGUAL | 6 refills | Status: DC | PRN

## 2021-02-02 NOTE — Progress Notes (Signed)
Cardiology Office Note:    Date:  02/02/2021   ID:  [name], DOB April 10, 1961, MRN [id number]  PCP:  [name], [name]  Cardiologist:  Garwin Brothers, MD   Referring MD: [facility]    ASSESSMENT:    1. Angina pectoris (HCC)   2. Coronary artery disease involving native coronary artery of native heart, unspecified whether angina present   3. Essential hypertension   4. Mixed hyperlipidemia   5. Diabetes mellitus due to underlying condition with unspecified complications (HCC)   6. Morbid obesity (HCC)    PLAN:    In order of problems listed above:  Angina pectoris: Coronary artery disease: Patient has known coronary artery disease.  Her symptoms are very concerning.  I have made the following recommendations.  She will take a coated baby aspirin on a daily basis.  Sublingual nitroglycerin prescription was sent, its protocol and 911 protocol explained and the patient vocalized understanding questions were answered to the patient's satisfaction. I discussed coronary angiography and left heart catheterization with the patient at extensive length. Procedure, benefits and potential risks were explained. Patient had multiple questions which were answered to the patient's satisfaction. Patient agreed and consented for the procedure. Further recommendations will be made based on the findings of the coronary angiography. In the interim. The patient has any significant symptoms he knows to go to the nearest emergency room. Essential hypertension: Blood pressure stable and diet was emphasized. Mixed dyslipidemia and diabetes mellitus and obesity: Lifestyle modification discussed with the patient at extensive length and she promises to do better.  Further recommendations will depend based on findings of the coronary angiography.   Medication Adjustments/Labs and Tests Ordered: Current medicines are reviewed at length with the patient today.  Concerns regarding medicines are  outlined above.  No orders of the defined types were placed in this encounter.  No orders of the defined types were placed in this encounter.    No chief complaint on file.    History of Present Illness:    Marie Chen is a 60 y.o. female.  Patient has past medical history of coronary artery disease, essential hypertension, dyslipidemia and morbid obesity.  She was assessed by me for preoperative purposes.  Her CT coronary angiography was overall unremarkable though suboptimal.  Subsequently she has done fine.  Now she mentions to me that she has chest tightness once or twice a week for which she uses nitroglycerin with good relief.  No orthopnea or PND.  The symptoms are concerning to her.  At the time of my evaluation, the patient is alert awake oriented and in no distress.  She has had coronary angiography a couple of years ago with nonobstructive stenosis.  Past Medical History:  Diagnosis Date   Acute hypoxemic respiratory failure due to severe acute respiratory syndrome coronavirus 2 (SARS-CoV-2) disease (HCC) 06/18/2019   Acute pharyngitis 05/23/2009   Qualifier: Diagnosis of  By: Felicity Coyer MD, Vikki Ports A    Allergic rhinitis 03/26/2007   Qualifier: Diagnosis of  By: Jonny Ruiz MD, Len Blalock   Formatting of this note might be different from the original. Overview:  Qualifier: Diagnosis of  By: Jonny Ruiz MD, Len Blalock   Asthma    severe   ASTHMA NOS W/ACUTE EXACERBATION 03/26/2007   Qualifier: Diagnosis of  By: Jonny Ruiz MD, Len Blalock    Asthma with exacerbation 03/26/2007   Formatting of this note might be different from the original. Overview:  Qualifier: Diagnosis of  By: Jonny Ruiz  MD, Len BlalockJames W   ASTHMATIC BRONCHITIS, ACUTE 04/27/2008   Qualifier: Diagnosis of  By: Jonny RuizJohn MD, Len BlalockJames W    Bronchitis, chronic (HCC) 04/22/2008   Qualifier: Diagnosis of  By: Thurnell LoseSmith NCMA, Barbara    Formatting of this note might be different from the original. Overview:  Qualifier: Diagnosis of  By: Alesia RichardsSmith NCMA, Barbara   CAD  (coronary artery disease) 01/01/2019   Carpal tunnel syndrome of left wrist 12/30/2019   Chronic obstructive pulmonary disease (HCC) 06/19/2019   Chronic pain syndrome 04/25/2017   Community acquired pneumonia 01/15/2020   Coronary artery disease    Cough with hemoptysis 05/11/2018   Depression    DEPRESSION 01/05/2007   Qualifier: Diagnosis of  By: Jonny RuizJohn MD, Len BlalockJames W    Diabetes mellitus (HCC) 10/07/2020   Diabetes mellitus due to underlying condition with unspecified complications (HCC) 03/26/2007   Qualifier: Diagnosis of  By: Jonny RuizJohn MD, Len BlalockJames W    Diabetes mellitus type 2 in obese (HCC) 05/12/2018   Diabetes mellitus without complication (HCC)    type 2   DSORD CIRCADIAN RHY SHFT WRK SLEEP PHASE TYPE 03/26/2007   Qualifier: Diagnosis of  By: Jonny RuizJohn MD, Len BlalockJames W   Formatting of this note might be different from the original. Overview:  Qualifier: Diagnosis of  By: Jonny RuizJohn MD, Len BlalockJames W   Dyslipidemia 10/19/2019   DYSPHAGIA 04/27/2008   Qualifier: Diagnosis of  By: Marina GoodellPerry MD, Wilhemina BonitoJohn N    Dyspnea    with exertion   Essential hypertension 01/05/2007   Qualifier: Diagnosis of  By: Jonny RuizJohn MD, Len BlalockJames W    Fatty liver    Fibromyalgia    GERD 01/05/2007   Qualifier: Diagnosis of  By: Jonny RuizJohn MD, Len BlalockJames W    GERD (gastroesophageal reflux disease)    HAMMER TOE 01/05/2007   Qualifier: Diagnosis of  By: Jonny RuizJohn MD, Len BlalockJames W    Headache 02/24/2009   Qualifier: Diagnosis of  By: Jonny RuizJohn MD, Len BlalockJames W   HEADACHE 02/24/2009   Qualifier: Diagnosis of  By: Jonny RuizJohn MD, Len BlalockJames W    Hemoptysis 05/11/2018   Hyperlipidemia 01/05/2007   diet controlled Formatting of this note might be different from the original. Overview:  Qualifier: Diagnosis of  By: Jonny RuizJohn MD, Len BlalockJames W   Hypertension    Hypothyroidism    Inflammatory arthritis 07/22/2020   IRRITABLE BOWEL SYNDROME, HX OF 03/26/2007   Qualifier: Diagnosis of  By: Jonny RuizJohn MD, Len BlalockJames W    Lumbar facet arthropathy 04/25/2017   Lumbar radiculopathy 09/19/2017   Migraine headache  04/22/2008   Formatting of this note might be different from the original. Overview:  Qualifier: Diagnosis of  By: Veryl SpeakSmith NCMA, Barbara   MIGRAINE, CHRONIC 04/22/2008   Qualifier: Diagnosis of  By: Alesia RichardsSmith NCMA, Barbara     Mixed dyslipidemia 01/05/2007   Qualifier: Diagnosis of  By: Jonny RuizJohn MD, Len BlalockJames W    Morbid obesity (HCC) 12/04/2018   Morbid obesity with BMI of 50.0-59.9, adult (HCC) 09/23/2019   Numbness 12/04/2019   Old disruption of left lateral collateral ligament 12/04/2019   Osteoarthritis 07/22/2020   Pain from implanted hardware 08/01/2017   Pain in left shoulder 07/13/2020   PEPTIC ULCER DISEASE 03/26/2007   Qualifier: Diagnosis of  By: Jonny RuizJohn MD, Len BlalockJames W    PERSONAL HISTORY ENDOCRN METABOLIC&IMMUNITY D/O 04/22/2008   Qualifier: Diagnosis of  By: Kem BoroughsSmith NCMA, Barbara     Plantar callus 08/01/2017   Plantar fasciitis 08/01/2017   Pre-operative cardiovascular examination 07/25/2020   Pre-ulcerative corn or callous  01/30/2018   Preop cardiovascular exam 01/22/2020   Primary osteoarthritis of both hips 04/25/2017   Primary osteoarthritis of both knees 04/25/2017   RESTLESS LEG SYNDROME, HX OF 03/26/2007   Qualifier: Diagnosis of  By: Jonny Ruiz MD, Len Blalock    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 Overview:  Qualifier: Diagnosis of  By: Jonny Ruiz MD, Len Blalock   Thoracic spine pain 11/13/2017   Thyroid disease    Trigger finger, left little finger 12/30/2019   Trochanteric bursitis of left hip 03/02/2018   Type 2 diabetes mellitus with diabetic polyneuropathy, with long-term current use of insulin (HCC) 01/26/2020   Type 2 diabetes mellitus with  hyperglycemia, without long-term current use of insulin (HCC) 10/19/2019    Past Surgical History:  Procedure Laterality Date   ANKLE SURGERY  2010   BACK SURGERY     CARPAL TUNNEL RELEASE Bilateral    CHOLECYSTECTOMY     COLONOSCOPY  04/2014   FOOT SURGERY     bunion surgery bialteral   HYSTEROSCOPY     LEFT HEART CATH AND CORONARY ANGIOGRAPHY N/A 12/19/2018   Procedure: LEFT HEART CATH AND CORONARY ANGIOGRAPHY;  Surgeon: Yvonne Kendall, MD;  Location: [facility];  Service: Cardiovascular;  Laterality: N/A;   LIGAMENT REPAIR Left 02/04/2020   Procedure: REPAIR RADIAL COLLATERAL LIGAMENT METACARPAL PHALANGEAL LEFT SMALL FINGER; POSSIBLE PINNING;  Surgeon: Cindee Salt, MD;  Location: [facility] OR;  Service: Orthopedics;  Laterality: Left;  AXILLARY BLOCK   RESECTION DISTAL CLAVICAL Right    STERIOD INJECTION Left 02/04/2020   Procedure: INJECTION LEFT CARPAL TUNNEL;  Surgeon: Cindee Salt, MD;  Location: [facility] OR;  Service: Orthopedics;  Laterality: Left;   TUBAL LIGATION     UPPER GI ENDOSCOPY  04/2014   WISDOM TOOTH EXTRACTION      Current Medications: Current Meds  Medication Sig   ACCU-CHEK AVIVA PLUS test strip USE AS INSTRUCTED TO TEST BLOOD SUGAR UP TO 3 TIMES DAILY   albuterol (PROVENTIL) (2.5 MG/3ML) 0.083% nebulizer solution Take 2.5 mg by nebulization every 6 (six) hours as needed for wheezing or shortness of breath.    albuterol (VENTOLIN HFA) 108 (90 Base) MCG/ACT inhaler Inhale 1-2 puffs into the lungs every 6 (six) hours as needed for wheezing or shortness of breath.   Alcohol Swabs (ALCOHOL PADS) 70 % PADS Use as directed   aspirin EC 81 MG tablet Take 1 tablet (81 mg total) by mouth daily.   calcium carbonate (TUMS - DOSED IN MG ELEMENTAL CALCIUM) 500 MG chewable tablet Chew 2 tablets by mouth daily as needed for indigestion or heartburn.   Carboxymethylcellul-Glycerin (LUBRICATING EYE DROPS OP) Place 1 drop into both eyes daily as needed for dry eyes (dry eyes).    escitalopram (LEXAPRO) 20 MG tablet Take 20 mg by mouth at bedtime.    Evolocumab (REPATHA) 140 MG/ML SOSY Inject 140 mg into the skin every 14 (fourteen) days.   fluticasone (FLONASE) 50 MCG/ACT nasal spray Place 1 spray into both nostrils daily as needed for allergies.    Fluticasone-Umeclidin-Vilant (TRELEGY ELLIPTA) 100-62.5-25 MCG/INH AEPB Inhale 1 puff into  the lungs daily.    levothyroxine (SYNTHROID) 88 MCG tablet Take 1 tablet (88 mcg total) by mouth daily.   lisinopril-hydrochlorothiazide (PRINZIDE,ZESTORETIC) 20-25 MG tablet Take 1 tablet by mouth daily with breakfast.   montelukast (SINGULAIR) 10 MG tablet Take 10 mg by mouth daily with breakfast.   nitroGLYCERIN (NITROSTAT) 0.4 MG SL tablet Place 0.4 mg under the tongue every 5 (five) minutes as needed for chest pain.   OVER THE COUNTER MEDICATION Take 300 mg by mouth at bedtime as needed for sleep (sleep). CBD oil    oxyCODONE-acetaminophen (PERCOCET) 7.5-325 MG tablet TAKE 1 TABLET BY MOUTH EVERY 6 HOURS AS NEEDED FOR CHRONIC PAIN. MAX 4 PER DAY   pregabalin (LYRICA) 150 MG capsule Take 150 mg by mouth 3 (three) times daily.   terconazole (TERAZOL 7) 0.4 % vaginal cream Place 1 applicator vaginally at bedtime as needed for other (yeast infections). Yeast infection    topiramate (TOPAMAX) 25 MG tablet Take 25 mg by mouth at bedtime.   Vitamin D, Ergocalciferol, (DRISDOL) 1.25 MG (50000 UT) CAPS capsule Take 50,000 Units by mouth every Saturday.     Allergies:   Macrodantin [nitrofurantoin macrocrystal], Metformin and related, Atorvastatin, Hydromorphone hcl, Lipitor [atorvastatin calcium], Meperidine hcl, and Tetracyclines & related   Social History   Socioeconomic History   Marital status: Widowed    Spouse name: Not on file   Number of children: 4   Years of education: Not on file   Highest education level: Not on file  Occupational History   Occupation: CSR    Employer: [employer]  Tobacco Use   Smoking status: Former     Types: Cigarettes    Quit date: 06/15/2019    Years since quitting: 1.6   Smokeless tobacco: Never  Vaping Use   Vaping Use: Never used  Substance and Sexual Activity   Alcohol use: No    Alcohol/week: 0.0 standard drinks   Drug use: No   Sexual activity: Yes    Birth control/protection: Post-menopausal  Other Topics Concern   Not on file  Social History Narrative   Not on file   Social Determinants of Health   Financial Resource Strain: Not on file  Food Insecurity: Not on file  Transportation Needs: Not on file  Physical Activity: Not on file  Stress: Not on file  Social Connections: Not on file     Family History: The patient's family history includes Heart failure in her father. There is no history of Colon cancer.  ROS:   Please see the history of present illness.    All other systems reviewed and are negative.  EKGs/Labs/Other Studies Reviewed:    The following studies were reviewed today: EKG reveals sinus rhythm, bradycardia and nonspecific ST-T changes.   Recent Labs: 08/26/2020: ALT 14; BUN 20; Creatinine, Ser 0.54; Potassium 3.8; Sodium 139 12/30/2020: TSH 0.08  Recent Lipid Panel    Component Value Date/Time   CHOL 105 08/26/2020 1059   CHOL 179 01/07/2019 1019   TRIG 74.0 08/26/2020 1059   HDL 44.60 08/26/2020 1059   HDL 34 (L) 01/07/2019 1019   CHOLHDL 2 08/26/2020 1059   VLDL 14.8 08/26/2020 1059   LDLCALC 45 08/26/2020 1059   LDLCALC 123 (H) 01/07/2019 1019   LDLDIRECT 179.5 03/14/2009 1147    Physical Exam:    VS:  BP 126/76   Pulse (!) 48   Ht  (1.702 m)   Wt 251 lb 3.2 oz (113.9 kg)   LMP  (  LMP Unknown)   SpO2 96%   BMI 39.34 kg/m     Wt Readings from Last 3 Encounters:  02/02/21 251 lb 3.2 oz (113.9 kg)  12/30/20 256 lb (116.1 kg)  10/07/20 286 lb (129.7 kg)     GEN: Patient is in no acute distress HEENT: Normal NECK: No JVD; No carotid bruits LYMPHATICS: No lymphadenopathy CARDIAC: Hear sounds regular, 2/6  systolic murmur at the apex. RESPIRATORY:  Clear to auscultation without rales, wheezing or rhonchi  ABDOMEN: Soft, non-tender, non-distended MUSCULOSKELETAL:  No edema; No deformity  SKIN: Warm and dry NEUROLOGIC:  Alert and oriented x 3 PSYCHIATRIC:  Normal affect   Signed, Garwin Brothers, MD  02/02/2021 11:43 AM    [facility]

## 2021-02-02 NOTE — Addendum Note (Signed)
Addended by: Quintell Bonnin, Elmarie Shiley L on: 02/02/2021 03:59 PM   Modules accepted: Orders

## 2021-02-02 NOTE — H&P (View-Only) (Signed)
Cardiology Office Note:    Date:  02/02/2021   ID:  Marie Chen, DOB April 10, 1961, MRN [id number]  PCP:  [name], [name]  Cardiologist:  Garwin Brothers, MD   Referring MD: [facility]    ASSESSMENT:    1. Angina pectoris (HCC)   2. Coronary artery disease involving native coronary artery of native heart, unspecified whether angina present   3. Essential hypertension   4. Mixed hyperlipidemia   5. Diabetes mellitus due to underlying condition with unspecified complications (HCC)   6. Morbid obesity (HCC)    PLAN:    In order of problems listed above:  Angina pectoris: Coronary artery disease: Patient has known coronary artery disease.  Her symptoms are very concerning.  I have made the following recommendations.  She will take a coated baby aspirin on a daily basis.  Sublingual nitroglycerin prescription was sent, its protocol and 911 protocol explained and the patient vocalized understanding questions were answered to the patient's satisfaction. I discussed coronary angiography and left heart catheterization with the patient at extensive length. Procedure, benefits and potential risks were explained. Patient had multiple questions which were answered to the patient's satisfaction. Patient agreed and consented for the procedure. Further recommendations will be made based on the findings of the coronary angiography. In the interim. The patient has any significant symptoms he knows to go to the nearest emergency room. Essential hypertension: Blood pressure stable and diet was emphasized. Mixed dyslipidemia and diabetes mellitus and obesity: Lifestyle modification discussed with the patient at extensive length and she promises to do better.  Further recommendations will depend based on findings of the coronary angiography.   Medication Adjustments/Labs and Tests Ordered: Current medicines are reviewed at length with the patient today.  Concerns regarding medicines are  outlined above.  No orders of the defined types were placed in this encounter.  No orders of the defined types were placed in this encounter.    No chief complaint on file.    History of Present Illness:    Marie Chen is a 60 y.o. female.  Patient has past medical history of coronary artery disease, essential hypertension, dyslipidemia and morbid obesity.  She was assessed by me for preoperative purposes.  Her CT coronary angiography was overall unremarkable though suboptimal.  Subsequently she has done fine.  Now she mentions to me that she has chest tightness once or twice a week for which she uses nitroglycerin with good relief.  No orthopnea or PND.  The symptoms are concerning to her.  At the time of my evaluation, the patient is alert awake oriented and in no distress.  She has had coronary angiography a couple of years ago with nonobstructive stenosis.  Past Medical History:  Diagnosis Date   Acute hypoxemic respiratory failure due to severe acute respiratory syndrome coronavirus 2 (SARS-CoV-2) disease (HCC) 06/18/2019   Acute pharyngitis 05/23/2009   Qualifier: Diagnosis of  By: Felicity Coyer MD, Vikki Ports A    Allergic rhinitis 03/26/2007   Qualifier: Diagnosis of  By: Jonny Ruiz MD, Len Blalock   Formatting of this note might be different from the original. Overview:  Qualifier: Diagnosis of  By: Jonny Ruiz MD, Len Blalock   Asthma    severe   ASTHMA NOS W/ACUTE EXACERBATION 03/26/2007   Qualifier: Diagnosis of  By: Jonny Ruiz MD, Len Blalock    Asthma with exacerbation 03/26/2007   Formatting of this note might be different from the original. Overview:  Qualifier: Diagnosis of  By: Jonny Ruiz  MD, Len BlalockJames W   ASTHMATIC BRONCHITIS, ACUTE 04/27/2008   Qualifier: Diagnosis of  By: Jonny RuizJohn MD, Len BlalockJames W    Bronchitis, chronic (HCC) 04/22/2008   Qualifier: Diagnosis of  By: Thurnell LoseSmith NCMA, Barbara    Formatting of this note might be different from the original. Overview:  Qualifier: Diagnosis of  By: Alesia RichardsSmith NCMA, Barbara   CAD  (coronary artery disease) 01/01/2019   Carpal tunnel syndrome of left wrist 12/30/2019   Chronic obstructive pulmonary disease (HCC) 06/19/2019   Chronic pain syndrome 04/25/2017   Community acquired pneumonia 01/15/2020   Coronary artery disease    Cough with hemoptysis 05/11/2018   Depression    DEPRESSION 01/05/2007   Qualifier: Diagnosis of  By: Jonny RuizJohn MD, Len BlalockJames W    Diabetes mellitus (HCC) 10/07/2020   Diabetes mellitus due to underlying condition with unspecified complications (HCC) 03/26/2007   Qualifier: Diagnosis of  By: Jonny RuizJohn MD, Len BlalockJames W    Diabetes mellitus type 2 in obese (HCC) 05/12/2018   Diabetes mellitus without complication (HCC)    type 2   DSORD CIRCADIAN RHY SHFT WRK SLEEP PHASE TYPE 03/26/2007   Qualifier: Diagnosis of  By: Jonny RuizJohn MD, Len BlalockJames W   Formatting of this note might be different from the original. Overview:  Qualifier: Diagnosis of  By: Jonny RuizJohn MD, Len BlalockJames W   Dyslipidemia 10/19/2019   DYSPHAGIA 04/27/2008   Qualifier: Diagnosis of  By: Marina GoodellPerry MD, Wilhemina BonitoJohn N    Dyspnea    with exertion   Essential hypertension 01/05/2007   Qualifier: Diagnosis of  By: Jonny RuizJohn MD, Len BlalockJames W    Fatty liver    Fibromyalgia    GERD 01/05/2007   Qualifier: Diagnosis of  By: Jonny RuizJohn MD, Len BlalockJames W    GERD (gastroesophageal reflux disease)    HAMMER TOE 01/05/2007   Qualifier: Diagnosis of  By: Jonny RuizJohn MD, Len BlalockJames W    Headache 02/24/2009   Qualifier: Diagnosis of  By: Jonny RuizJohn MD, Len BlalockJames W   HEADACHE 02/24/2009   Qualifier: Diagnosis of  By: Jonny RuizJohn MD, Len BlalockJames W    Hemoptysis 05/11/2018   Hyperlipidemia 01/05/2007   diet controlled Formatting of this note might be different from the original. Overview:  Qualifier: Diagnosis of  By: Jonny RuizJohn MD, Len BlalockJames W   Hypertension    Hypothyroidism    Inflammatory arthritis 07/22/2020   IRRITABLE BOWEL SYNDROME, HX OF 03/26/2007   Qualifier: Diagnosis of  By: Jonny RuizJohn MD, Len BlalockJames W    Lumbar facet arthropathy 04/25/2017   Lumbar radiculopathy 09/19/2017   Migraine headache  04/22/2008   Formatting of this note might be different from the original. Overview:  Qualifier: Diagnosis of  By: Veryl SpeakSmith NCMA, Barbara   MIGRAINE, CHRONIC 04/22/2008   Qualifier: Diagnosis of  By: Alesia RichardsSmith NCMA, Barbara     Mixed dyslipidemia 01/05/2007   Qualifier: Diagnosis of  By: Jonny RuizJohn MD, Len BlalockJames W    Morbid obesity (HCC) 12/04/2018   Morbid obesity with BMI of 50.0-59.9, adult (HCC) 09/23/2019   Numbness 12/04/2019   Old disruption of left lateral collateral ligament 12/04/2019   Osteoarthritis 07/22/2020   Pain from implanted hardware 08/01/2017   Pain in left shoulder 07/13/2020   PEPTIC ULCER DISEASE 03/26/2007   Qualifier: Diagnosis of  By: Jonny RuizJohn MD, Len BlalockJames W    PERSONAL HISTORY ENDOCRN METABOLIC&IMMUNITY D/O 04/22/2008   Qualifier: Diagnosis of  By: Kem BoroughsSmith NCMA, Barbara     Plantar callus 08/01/2017   Plantar fasciitis 08/01/2017   Pre-operative cardiovascular examination 07/25/2020   Pre-ulcerative corn or callous  01/30/2018   Preop cardiovascular exam 01/22/2020   Primary osteoarthritis of both hips 04/25/2017   Primary osteoarthritis of both knees 04/25/2017   RESTLESS LEG SYNDROME, HX OF 03/26/2007   Qualifier: Diagnosis of  By: Jonny Ruiz MD, Len Blalock    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 Overview:  Qualifier: Diagnosis of  By: Jonny Ruiz MD, Len Blalock   Thoracic spine pain 11/13/2017   Thyroid disease    Trigger finger, left little finger 12/30/2019   Trochanteric bursitis of left hip 03/02/2018   Type 2 diabetes mellitus with diabetic polyneuropathy, with long-term current use of insulin (HCC) 01/26/2020   Type 2 diabetes mellitus with  hyperglycemia, without long-term current use of insulin (HCC) 10/19/2019    Past Surgical History:  Procedure Laterality Date   ANKLE SURGERY  2010   BACK SURGERY     CARPAL TUNNEL RELEASE Bilateral    CHOLECYSTECTOMY     COLONOSCOPY  04/2014   FOOT SURGERY     bunion surgery bialteral   HYSTEROSCOPY     LEFT HEART CATH AND CORONARY ANGIOGRAPHY N/A 12/19/2018   Procedure: LEFT HEART CATH AND CORONARY ANGIOGRAPHY;  Surgeon: Yvonne Kendall, MD;  Location: [facility];  Service: Cardiovascular;  Laterality: N/A;   LIGAMENT REPAIR Left 02/04/2020   Procedure: REPAIR RADIAL COLLATERAL LIGAMENT METACARPAL PHALANGEAL LEFT SMALL FINGER; POSSIBLE PINNING;  Surgeon: Cindee Salt, MD;  Location: [facility] OR;  Service: Orthopedics;  Laterality: Left;  AXILLARY BLOCK   RESECTION DISTAL CLAVICAL Right    STERIOD INJECTION Left 02/04/2020   Procedure: INJECTION LEFT CARPAL TUNNEL;  Surgeon: Cindee Salt, MD;  Location: [facility] OR;  Service: Orthopedics;  Laterality: Left;   TUBAL LIGATION     UPPER GI ENDOSCOPY  04/2014   WISDOM TOOTH EXTRACTION      Current Medications: Current Meds  Medication Sig   ACCU-CHEK AVIVA PLUS test strip USE AS INSTRUCTED TO TEST BLOOD SUGAR UP TO 3 TIMES DAILY   albuterol (PROVENTIL) (2.5 MG/3ML) 0.083% nebulizer solution Take 2.5 mg by nebulization every 6 (six) hours as needed for wheezing or shortness of breath.    albuterol (VENTOLIN HFA) 108 (90 Base) MCG/ACT inhaler Inhale 1-2 puffs into the lungs every 6 (six) hours as needed for wheezing or shortness of breath.   Alcohol Swabs (ALCOHOL PADS) 70 % PADS Use as directed   aspirin EC 81 MG tablet Take 1 tablet (81 mg total) by mouth daily.   calcium carbonate (TUMS - DOSED IN MG ELEMENTAL CALCIUM) 500 MG chewable tablet Chew 2 tablets by mouth daily as needed for indigestion or heartburn.   Carboxymethylcellul-Glycerin (LUBRICATING EYE DROPS OP) Place 1 drop into both eyes daily as needed for dry eyes (dry eyes).    escitalopram (LEXAPRO) 20 MG tablet Take 20 mg by mouth at bedtime.    Evolocumab (REPATHA) 140 MG/ML SOSY Inject 140 mg into the skin every 14 (fourteen) days.   fluticasone (FLONASE) 50 MCG/ACT nasal spray Place 1 spray into both nostrils daily as needed for allergies.    Fluticasone-Umeclidin-Vilant (TRELEGY ELLIPTA) 100-62.5-25 MCG/INH AEPB Inhale 1 puff into  the lungs daily.    levothyroxine (SYNTHROID) 88 MCG tablet Take 1 tablet (88 mcg total) by mouth daily.   lisinopril-hydrochlorothiazide (PRINZIDE,ZESTORETIC) 20-25 MG tablet Take 1 tablet by mouth daily with breakfast.   montelukast (SINGULAIR) 10 MG tablet Take 10 mg by mouth daily with breakfast.   nitroGLYCERIN (NITROSTAT) 0.4 MG SL tablet Place 0.4 mg under the tongue every 5 (five) minutes as needed for chest pain.   OVER THE COUNTER MEDICATION Take 300 mg by mouth at bedtime as needed for sleep (sleep). CBD oil    oxyCODONE-acetaminophen (PERCOCET) 7.5-325 MG tablet TAKE 1 TABLET BY MOUTH EVERY 6 HOURS AS NEEDED FOR CHRONIC PAIN. MAX 4 PER DAY   pregabalin (LYRICA) 150 MG capsule Take 150 mg by mouth 3 (three) times daily.   terconazole (TERAZOL 7) 0.4 % vaginal cream Place 1 applicator vaginally at bedtime as needed for other (yeast infections). Yeast infection    topiramate (TOPAMAX) 25 MG tablet Take 25 mg by mouth at bedtime.   Vitamin D, Ergocalciferol, (DRISDOL) 1.25 MG (50000 UT) CAPS capsule Take 50,000 Units by mouth every Saturday.     Allergies:   Macrodantin [nitrofurantoin macrocrystal], Metformin and related, Atorvastatin, Hydromorphone hcl, Lipitor [atorvastatin calcium], Meperidine hcl, and Tetracyclines & related   Social History   Socioeconomic History   Marital status: Widowed    Spouse name: Not on file   Number of children: 4   Years of education: Not on file   Highest education level: Not on file  Occupational History   Occupation: CSR    Employer: [employer]  Tobacco Use   Smoking status: Former     Types: Cigarettes    Quit date: 06/15/2019    Years since quitting: 1.6   Smokeless tobacco: Never  Vaping Use   Vaping Use: Never used  Substance and Sexual Activity   Alcohol use: No    Alcohol/week: 0.0 standard drinks   Drug use: No   Sexual activity: Yes    Birth control/protection: Post-menopausal  Other Topics Concern   Not on file  Social History Narrative   Not on file   Social Determinants of Health   Financial Resource Strain: Not on file  Food Insecurity: Not on file  Transportation Needs: Not on file  Physical Activity: Not on file  Stress: Not on file  Social Connections: Not on file     Family History: The patient's family history includes Heart failure in her father. There is no history of Colon cancer.  ROS:   Please see the history of present illness.    All other systems reviewed and are negative.  EKGs/Labs/Other Studies Reviewed:    The following studies were reviewed today: EKG reveals sinus rhythm, bradycardia and nonspecific ST-T changes.   Recent Labs: 08/26/2020: ALT 14; BUN 20; Creatinine, Ser 0.54; Potassium 3.8; Sodium 139 12/30/2020: TSH 0.08  Recent Lipid Panel    Component Value Date/Time   CHOL 105 08/26/2020 1059   CHOL 179 01/07/2019 1019   TRIG 74.0 08/26/2020 1059   HDL 44.60 08/26/2020 1059   HDL 34 (L) 01/07/2019 1019   CHOLHDL 2 08/26/2020 1059   VLDL 14.8 08/26/2020 1059   LDLCALC 45 08/26/2020 1059   LDLCALC 123 (H) 01/07/2019 1019   LDLDIRECT 179.5 03/14/2009 1147    Physical Exam:    VS:  BP 126/76   Pulse (!) 48   Ht 5' 7" (1.702 m)   Wt 251 lb 3.2 oz (113.9 kg)   LMP  (  LMP Unknown)   SpO2 96%   BMI 39.34 kg/m     Wt Readings from Last 3 Encounters:  02/02/21 251 lb 3.2 oz (113.9 kg)  12/30/20 256 lb (116.1 kg)  10/07/20 286 lb (129.7 kg)     GEN: Patient is in no acute distress HEENT: Normal NECK: No JVD; No carotid bruits LYMPHATICS: No lymphadenopathy CARDIAC: Hear sounds regular, 2/6  systolic murmur at the apex. RESPIRATORY:  Clear to auscultation without rales, wheezing or rhonchi  ABDOMEN: Soft, non-tender, non-distended MUSCULOSKELETAL:  No edema; No deformity  SKIN: Warm and dry NEUROLOGIC:  Alert and oriented x 3 PSYCHIATRIC:  Normal affect   Signed, Garwin Brothers, MD  02/02/2021 11:43 AM    [facility]

## 2021-02-02 NOTE — Patient Instructions (Signed)
Medication Instructions:  Your physician has recommended you make the following change in your medication:   Take 81 mg coated aspirin daily. Use nitroglycerin as needed for chest pain.  *If you need a refill on your cardiac medications before your next appointment, please call your pharmacy*   Lab Work: Your physician recommends that you have a BMET and CBC on 02/10/21. You do not need to fast or have an appointment.  If you have labs (blood work) drawn today and your tests are completely normal, you will receive your results only by: MyChart Message (if you have MyChart) OR A paper copy in the mail If you have any lab test that is abnormal or we need to change your treatment, we will call you to review the results.   Testing/Procedures:   [facility] CARDIOVASCULAR DIVISION [facility] AT [place] [address] [place] [address] Dept: [phone] Loc: [phone]  Marie Chen  02/02/2021  You are scheduled for a Cardiac Catheterization on Thursday, September 8 with Dr. Cristal Chen End.  1. Please arrive at the [facility] (Main Entrance A) at [facility]: [address] [place], [place] [address] at 5:30 AM (This time is two hours before your procedure to ensure your preparation). Free valet parking service is available.   Special note: Every effort is made to have your procedure done on time. Please understand that emergencies sometimes delay scheduled procedures.  2. Diet: Do not eat solid foods after midnight.  The patient may have clear liquids until 5am upon the day of the procedure.  3. Labs: You will need to have blood drawn on Friday, September 2 at [facility]: [address] [place] . You do not need to be fasting.  4. Medication instructions in preparation for your procedure:   Contrast Allergy: No  Stop taking, Lisinopril (Zestril or Prinivil) Thursday, September 8,   On the morning of your procedure,  take your Aspirin and any morning medicines NOT listed above.  You may use sips of water.  5. Plan for one night stay--bring personal belongings. 6. Bring a current list of your medications and current insurance cards. 7. You MUST have a responsible person to drive you home. 8. Someone MUST be with you the first 24 hours after you arrive home or your discharge will be delayed. 9. Please wear clothes that are easy to get on and off and wear slip-on shoes.  Thank you for allowing [place] to care for you!   -- [facility]    Follow-Up: At [facility], you and your health needs are our priority.  As part of our continuing mission to provide you with exceptional heart care, we have created designated Provider Care Teams.  These Care Teams include your primary Cardiologist (physician) and Advanced Practice Providers (APPs -  Physician Assistants and Nurse Practitioners) who all work together to provide you with the care you need, when you need it.  We recommend signing up for the patient portal called "MyChart".  Sign up information is provided on this After Visit Summary.  MyChart is used to connect with patients for Virtual Visits (Telemedicine).  Patients are able to view lab/test results, encounter notes, upcoming appointments, etc.  Non-urgent messages can be sent to your provider as well.   To learn more about what you can do with MyChart, go to [email or web address].    Your next appointment:   1 month(s)  The format for your  next appointment:   In Person  Provider:   [name], MD   Other Instructions  Coronary Angiogram With Stent Coronary angiogram with stent placement is a procedure to widen or open a narrow blood vessel of the heart (coronary artery). Arteries may become blocked by cholesterol buildup (plaques) in the lining of the artery wall. When a coronary artery becomes partially blocked, blood flow to that area decreases. This may lead  to chest pain or a heart attack (myocardial infarction). A stent is a small piece of metal that looks like mesh or spring. Stent placement may be done as treatment after a heart attack, or to prevent a heart attack if a blocked artery is found by a coronary angiogram. Let your health care provider know about: Any allergies you have, including allergies to medicines or contrast dye. All medicines you are taking, including vitamins, herbs, eye drops, creams, and over-the-counter medicines. Any problems you or family members have had with anesthetic medicines. Any blood disorders you have. Any surgeries you have had. Any medical conditions you have, including kidney problems or kidney failure. Whether you are pregnant or may be pregnant. Whether you are breastfeeding. What are the risks? Generally, this is a safe procedure. However, serious problems may occur, including: Damage to nearby structures or organs, such as the heart, blood vessels, or kidneys. A return of blockage. Bleeding, infection, or bruising at the insertion site. A collection of blood under the skin (hematoma) at the insertion site. A blood clot in another part of the body. Allergic reaction to medicines or dyes. Bleeding into the abdomen (retroperitoneal bleeding). Stroke (rare). Heart attack (rare). What happens before the procedure? Staying hydrated Follow instructions from your health care provider about hydration, which may include: Up to 2 hours before the procedure - you may continue to drink clear liquids, such as water, clear fruit juice, black coffee, and plain tea.    Eating and drinking restrictions Follow instructions from your health care provider about eating and drinking, which may include: 8 hours before the procedure - stop eating heavy meals or foods, such as meat, fried foods, or fatty foods. 6 hours before the procedure - stop eating light meals or foods, such as toast or cereal. 2 hours before the  procedure - stop drinking clear liquids. Medicines Ask your health care provider about: Changing or stopping your regular medicines. This is especially important if you are taking diabetes medicines or blood thinners. Taking medicines such as aspirin and ibuprofen. These medicines can thin your blood. Do not take these medicines unless your health care provider tells you to take them. Generally, aspirin is recommended before a thin tube, called a catheter, is passed through a blood vessel and inserted into the heart (cardiac catheterization). Taking over-the-counter medicines, vitamins, herbs, and supplements. General instructions Do not use any products that contain nicotine or tobacco for at least 4 weeks before the procedure. These products include cigarettes, e-cigarettes, and chewing tobacco. If you need help quitting, ask your health care provider. Plan to have someone take you home from the hospital or clinic. If you will be going home right after the procedure, plan to have someone with you for 24 hours. You may have tests and imaging procedures. Ask your health care provider: How your insertion site will be marked. Ask which artery will be used for the procedure. What steps will be taken to help prevent infection. These may include: Removing hair at the insertion site. Washing skin with a germ-killing  soap. Taking antibiotic medicine. What happens during the procedure? An IV will be inserted into one of your veins. Electrodes may be placed on your chest to monitor your heart rate during the procedure. You will be given one or more of the following: A medicine to help you relax (sedative). A medicine to numb the area (local anesthetic) for catheter insertion. A small incision will be made for catheter insertion. The catheter will be inserted into an artery using a guide wire. The location may be in your groin, your wrist, or the fold of your arm (near your elbow). An X-ray procedure  (fluoroscopy) will be used to help guide the catheter to the opening of the heart arteries. A dye will be injected into the catheter. X-rays will be taken. The dye helps to show where any narrowing or blockages are located in the arteries. Tell your health care provider if you have chest pain or trouble breathing. A tiny wire will be guided to the blocked spot, and a balloon will be inflated to make the artery wider. The stent will be expanded to crush the plaques into the wall of the vessel. The stent will hold the area open and improve the blood flow. Most stents have a drug coating to reduce the risk of the stent narrowing over time. The artery may be made wider using a drill, laser, or other tools that remove plaques. The catheter will be removed when the blood flow improves. The stent will stay where it was placed, and the lining of the artery will grow over it. A bandage (dressing) will be placed on the insertion site. Pressure will be applied to stop bleeding. The IV will be removed. This procedure may vary among health care providers and hospitals.    What happens after the procedure? Your blood pressure, heart rate, breathing rate, and blood oxygen level will be monitored until you leave the hospital or clinic. If the procedure is done through the leg, you will lie flat in bed for a few hours or for as long as told by your health care provider. You will be instructed not to bend or cross your legs. The insertion site and the pulse in your foot or wrist will be checked often. You may have more blood tests, X-rays, and a test that records the electrical activity of your heart (electrocardiogram, or ECG). Do not drive for 24 hours if you were given a sedative during your procedure. Summary Coronary angiogram with stent placement is a procedure to widen or open a narrowed coronary artery. This is done to treat heart problems. Before the procedure, let your health care provider know about all  the medical conditions and surgeries you have or have had. This is a safe procedure. However, some problems may occur, including damage to nearby structures or organs, bleeding, blood clots, or allergies. Follow your health care provider's instructions about eating, drinking, medicines, and other lifestyle changes, such as quitting tobacco use before the procedure. This information is not intended to replace advice given to you by your health care provider. Make sure you discuss any questions you have with your health care provider. Document Revised: 12/17/2018 Document Reviewed: 12/17/2018 Elsevier Patient Education  2021 Elsevier Inc.  Aspirin and Your Heart Aspirin is a medicine that prevents the platelets in your blood from sticking together. Platelets are the cells that your blood uses for clotting. Aspirin can be used to help reduce the risk of blood clots, heart attacks, and other heart-related problems.  What are the risks? Daily use of aspirin can cause side effects. Some of these include: Bleeding. Bleeding can be minor or serious. An example of minor bleeding is bleeding from a cut, and the bleeding does not stop. An example of more serious bleeding is stomach bleeding or, rarely, bleeding into the brain. Your risk of bleeding increases if you are also taking NSAIDs, such as ibuprofen. Increased bruising. Upset stomach. An allergic reaction. People who have growths inside the nose (nasal polyps) have an increased risk of developing an aspirin allergy. How to use aspirin to care for your heart Take aspirin only as told by your health care provider. Make sure that you understand how much to take and what form to take. The two forms of aspirin are: Non-enteric-coated.This type of aspirin does not have a coating and is absorbed quickly. This type of aspirin also comes in a chewable form. Enteric-coated. This type of aspirin has a coating that releases the medicine very slowly. Enteric-coated  aspirin might cause less stomach upset than non-enteric-coated aspirin. This type of aspirin should not be chewed or crushed. Work with your health care provider to find out whether it is safe and beneficial for you to take aspirin daily. Taking aspirin daily may be helpful if: You have had a heart attack or chest pain, or you are at risk for a heart attack. You have a condition in which certain heart vessels are blocked (coronary artery disease), and you have had a procedure to treat it. Examples are: Open-heart surgery, such as coronary artery bypass surgery (CABG). Coronary angioplasty,which is done to widen a blood vessel of your heart. Having a small mesh tube, or stent, placed in your coronary artery. You have had certain types of stroke or a mini-stroke known as a transient ischemic attack (TIA). You have a narrowing of the arteries that supply the limbs (peripheral artery disease, or PAD). You have long-term (chronic) heart rhythm problems, such as atrial fibrillation, and your health care provider thinks aspirin may help. You have valve disease or have had surgery on a valve. You are considered at increased risk of developing coronary artery disease or PAD.    Follow these instructions at home Medicines Take over-the-counter and prescription medicines only as told by your health care provider. If you are taking blood thinners: Talk with your health care provider before you take any medicines that contain aspirin or NSAIDs, such as ibuprofen. These medicines increase your risk for dangerous bleeding. Take your medicine exactly as told, at the same time every day. Avoid activities that could cause injury or bruising, and follow instructions about how to prevent falls. Wear a medical alert bracelet or carry a card that lists what medicines you take. General instructions Do not drink alcohol if: Your health care provider tells you not to drink. You are pregnant, may be pregnant, or are  planning to become pregnant. If you drink alcohol: Limit how much you use to: 0-1 drink a day for women. 0-2 drinks a day for men. Be aware of how much alcohol is in your drink. In the [place] one drink equals one 12 oz bottle of beer (355 mL), one 5 oz glass of wine (148 mL), or one 1 oz glass of hard liquor (44 mL). Keep all follow-up visits as told by your health care provider. This is important. Where to find more information The American Heart Association: www.heart.org Contact a health care provider if you have: Unusual bleeding or bruising. Stomach pain  or nausea. Ringing in your ears. An allergic reaction that causes hives, itchy skin, or swelling of the lips, tongue, or face. Get help right away if: You notice that your bowel movements are bloody, or dark red or black in color. You vomit or cough up blood. You have blood in your urine. You cough, breathe loudly (wheeze), or feel short of breath. You have chest pain, especially if the pain spreads to your arms, back, neck, or jaw. You have a headache with confusion. You have any symptoms of a stroke. "BE FAST" is an easy way to remember the main warning signs of a stroke: B - Balance. Signs are dizziness, sudden trouble walking, or loss of balance. E - Eyes. Signs are trouble seeing or a sudden change in vision. F - Face. Signs are sudden weakness or numbness of the face, or the face or eyelid drooping on one side. A - Arms. Signs are weakness or numbness in an arm. This happens suddenly and usually on one side of the body. S - Speech. Signs are sudden trouble speaking, slurred speech, or trouble understanding what people say. T - Time. Time to call emergency services. Write down what time symptoms started. You have other signs of a stroke, such as: A sudden, severe headache with no known cause. Nausea or vomiting. Seizure. These symptoms may represent a serious problem that is an emergency. Do not wait to see if the symptoms  will go away. Get medical help right away. Call your local emergency services (911 in the [place] Do not drive yourself to the hospital. Summary Aspirin use can help reduce the risk of blood clots, heart attacks, and other heart-related problems. Daily use of aspirin can cause side effects. Take aspirin only as told by your health care provider. Make sure that you understand how much to take and what form to take. Your health care provider will help you determine whether it is safe and beneficial for you to take aspirin daily. This information is not intended to replace advice given to you by your health care provider. Make sure you discuss any questions you have with your health care provider. Document Revised: 03/02/2019 Document Reviewed: 03/02/2019 Elsevier Patient Education  2021 Elsevier Inc. Nitroglycerin sublingual tablets What is this medicine? NITROGLYCERIN (nye troe GLI ser in) is a type of vasodilator. It relaxes blood vessels, increasing the blood and oxygen supply to your heart. This medicine is used to relieve chest pain caused by angina. It is also used to prevent chest pain before activities like climbing stairs, going outdoors in cold weather, or sexual activity. This medicine may be used for other purposes; ask your health care provider or pharmacist if you have questions. COMMON BRAND NAME(S): Nitroquick, Nitrostat, Nitrotab What should I tell my health care provider before I take this medicine? They need to know if you have any of these conditions: anemia head injury, recent stroke, or bleeding in the brain liver disease previous heart attack an unusual or allergic reaction to nitroglycerin, other medicines, foods, dyes, or preservatives pregnant or trying to get pregnant breast-feeding How should I use this medicine? Take this medicine by mouth as needed. Use at the first sign of an angina attack (chest pain or tightness). You can also take this medicine 5 to 10 minutes  before an event likely to produce chest pain. Follow the directions exactly as written on the prescription label. Place one tablet under your tongue and let it dissolve. Do not swallow whole. Replace  the dose if you accidentally swallow it. It will help if your mouth is not dry. Saliva around the tablet will help it to dissolve more quickly. Do not eat or drink, smoke or chew tobacco while a tablet is dissolving. Sit down when taking this medicine. In an angina attack, you should feel better within 5 minutes after your first dose. You can take a dose every 5 minutes up to a total of 3 doses. If you do not feel better or feel worse after 1 dose, call 9-1-1 at once. Do not take more than 3 doses in 15 minutes. Your health care provider might give you other directions. Follow those directions if he or she does. Do not take your medicine more often than directed. Talk to your health care provider about the use of this medicine in children. Special care may be needed. Overdosage: If you think you have taken too much of this medicine contact a poison control center or emergency room at once. NOTE: This medicine is only for you. Do not share this medicine with others. What if I miss a dose? This does not apply. This medicine is only used as needed. What may interact with this medicine? Do not take this medicine with any of the following medications: certain migraine medicines like ergotamine and dihydroergotamine (DHE) medicines used to treat erectile dysfunction like sildenafil, tadalafil, and vardenafil riociguat This medicine may also interact with the following medications: alteplase aspirin heparin medicines for high blood pressure medicines for mental depression other medicines used to treat angina phenothiazines like chlorpromazine, mesoridazine, prochlorperazine, thioridazine This list may not describe all possible interactions. Give your health care provider a list of all the medicines, herbs,  non-prescription drugs, or dietary supplements you use. Also tell them if you smoke, drink alcohol, or use illegal drugs. Some items may interact with your medicine. What should I watch for while using this medicine? Tell your doctor or health care professional if you feel your medicine is no longer working. Keep this medicine with you at all times. Sit or lie down when you take your medicine to prevent falling if you feel dizzy or faint after using it. Try to remain calm. This will help you to feel better faster. If you feel dizzy, take several deep breaths and lie down with your feet propped up, or bend forward with your head resting between your knees. You may get drowsy or dizzy. Do not drive, use machinery, or do anything that needs mental alertness until you know how this drug affects you. Do not stand or sit up quickly, especially if you are an older patient. This reduces the risk of dizzy or fainting spells. Alcohol can make you more drowsy and dizzy. Avoid alcoholic drinks. Do not treat yourself for coughs, colds, or pain while you are taking this medicine without asking your doctor or health care professional for advice. Some ingredients may increase your blood pressure. What side effects may I notice from receiving this medicine? Side effects that you should report to your doctor or health care professional as soon as possible: allergic reactions (skin rash, itching or hives; swelling of the face, lips, or tongue) low blood pressure (dizziness; feeling faint or lightheaded, falls; unusually weak or tired) low red blood cell counts (trouble breathing; feeling faint; lightheaded, falls; unusually weak or tired) Side effects that usually do not require medical attention (report to your doctor or health care professional if they continue or are bothersome): facial flushing (redness) headache nausea, vomiting  This list may not describe all possible side effects. Call your doctor for medical  advice about side effects. You may report side effects to FDA at 1-800-FDA-1088. Where should I keep my medicine? Keep out of the reach of children. Store at room temperature between 20 and 25 degrees C (68 and 77 degrees F). Store in Retail buyer. Protect from light and moisture. Keep tightly closed. Throw away any unused medicine after the expiration date. NOTE: This sheet is a summary. It may not cover all possible information. If you have questions about this medicine, talk to your doctor, pharmacist, or health care provider.  2021 Elsevier/Gold Standard (2018-02-26 16:46:32)

## 2021-02-11 LAB — BASIC METABOLIC PANEL
BUN/Creatinine Ratio: 20 (ref 12–28)
BUN: 15 mg/dL (ref 8–27)
CO2: 24 mmol/L (ref 20–29)
Calcium: 9.6 mg/dL (ref 8.7–10.3)
Chloride: 103 mmol/L (ref 96–106)
Creatinine, Ser: 0.74 mg/dL (ref 0.57–1.00)
Glucose: 101 mg/dL — ABNORMAL HIGH (ref 65–99)
Potassium: 4 mmol/L (ref 3.5–5.2)
Sodium: 140 mmol/L (ref 134–144)
eGFR: 92 mL/min/{1.73_m2} (ref >59)

## 2021-02-11 LAB — CBC WITH DIFFERENTIAL/PLATELET
Basophils Absolute: 0.1 10*3/uL (ref 0.0–0.2)
Basos: 1 %
EOS (ABSOLUTE): 0.1 10*3/uL (ref 0.0–0.4)
Eos: 2 %
Hematocrit: 42 % (ref 34.0–46.6)
Hemoglobin: 13.8 g/dL (ref 11.1–15.9)
Immature Grans (Abs): 0 10*3/uL (ref 0.0–0.1)
Immature Granulocytes: 0 %
Lymphocytes Absolute: 2.8 10*3/uL (ref 0.7–3.1)
Lymphs: 36 %
MCH: 30.3 pg (ref 26.6–33.0)
MCHC: 32.9 g/dL (ref 31.5–35.7)
MCV: 92 fL (ref 79–97)
Monocytes Absolute: 0.4 10*3/uL (ref 0.1–0.9)
Monocytes: 6 %
Neutrophils Absolute: 4.3 10*3/uL (ref 1.4–7.0)
Neutrophils: 55 %
Platelets: 229 10*3/uL (ref 150–450)
RBC: 4.56 x10E6/uL (ref 3.77–5.28)
RDW: 13.8 % (ref 11.7–15.4)
WBC: 7.8 10*3/uL (ref 3.4–10.8)

## 2021-02-14 ENCOUNTER — Telehealth: Payer: Self-pay | Admitting: *Deleted

## 2021-02-14 NOTE — Telephone Encounter (Signed)
Cardiac catheterization scheduled at [facility] for: Thursday February 16, 2021 7:30 AM Arrive [facility] Main Entrance A [facility]) at: 5:30 AM   No solid food after midnight prior to cath, clear liquids until 5 AM day of procedure.  Medication instructions: Hold: Lisinopril/HCT-AM of procedure  Except hold medications morning medications can be taken pre-cath with sips of water including aspirin 81 mg.    Confirmed patient has responsible adult to drive home post procedure and be with patient first 24 hours after arriving home.  Patients are allowed one visitor in the waiting room during the time they are at the hospital for their procedure. Both patient and visitor must wear a mask once they enter the hospital.   Patient reports does not currently have any symptoms concerning for COVID-19 and no household members with COVID-19 like illness.              Reviewed procedure/mask/visitor instructions with patient.

## 2021-02-16 ENCOUNTER — Ambulatory Visit (HOSPITAL_COMMUNITY)
Admission: RE | Admit: 2021-02-16 | Discharge: 2021-02-16 | Disposition: A | Discharge status: Home / Self Care | Payer: Medicare HMO | Attending: Internal Medicine | Admitting: Internal Medicine

## 2021-02-16 ENCOUNTER — Encounter (HOSPITAL_COMMUNITY): Payer: Self-pay | Admitting: Internal Medicine

## 2021-02-16 ENCOUNTER — Encounter (HOSPITAL_COMMUNITY)
Admission: RE | Disposition: A | Discharge status: Home / Self Care | Payer: Self-pay | Source: Home / Self Care | Attending: Internal Medicine

## 2021-02-16 ENCOUNTER — Other Ambulatory Visit: Payer: Self-pay

## 2021-02-16 DIAGNOSIS — Z6839 Body mass index (BMI) 39.0-39.9, adult: Secondary | ICD-10-CM | POA: Insufficient documentation

## 2021-02-16 DIAGNOSIS — Z79899 Other long term (current) drug therapy: Secondary | ICD-10-CM | POA: Diagnosis not present

## 2021-02-16 DIAGNOSIS — Z79890 Hormone replacement therapy (postmenopausal): Secondary | ICD-10-CM | POA: Diagnosis not present

## 2021-02-16 DIAGNOSIS — I25119 Atherosclerotic heart disease of native coronary artery with unspecified angina pectoris: Secondary | ICD-10-CM

## 2021-02-16 DIAGNOSIS — Z885 Allergy status to narcotic agent status: Secondary | ICD-10-CM | POA: Diagnosis not present

## 2021-02-16 DIAGNOSIS — Z7982 Long term (current) use of aspirin: Secondary | ICD-10-CM | POA: Insufficient documentation

## 2021-02-16 DIAGNOSIS — I2510 Atherosclerotic heart disease of native coronary artery without angina pectoris: Secondary | ICD-10-CM

## 2021-02-16 DIAGNOSIS — Z87891 Personal history of nicotine dependence: Secondary | ICD-10-CM | POA: Diagnosis not present

## 2021-02-16 DIAGNOSIS — Z888 Allergy status to other drugs, medicaments and biological substances status: Secondary | ICD-10-CM | POA: Insufficient documentation

## 2021-02-16 DIAGNOSIS — I10 Essential (primary) hypertension: Secondary | ICD-10-CM | POA: Diagnosis not present

## 2021-02-16 DIAGNOSIS — E119 Type 2 diabetes mellitus without complications: Secondary | ICD-10-CM | POA: Insufficient documentation

## 2021-02-16 DIAGNOSIS — E088 Diabetes mellitus due to underlying condition with unspecified complications: Secondary | ICD-10-CM

## 2021-02-16 DIAGNOSIS — E782 Mixed hyperlipidemia: Secondary | ICD-10-CM | POA: Diagnosis not present

## 2021-02-16 DIAGNOSIS — I209 Angina pectoris, unspecified: Secondary | ICD-10-CM | POA: Diagnosis present

## 2021-02-16 HISTORY — PX: LEFT HEART CATH AND CORONARY ANGIOGRAPHY: CATH118249

## 2021-02-16 LAB — GLUCOSE, CAPILLARY
Glucose-Capillary: 109 mg/dL — ABNORMAL HIGH (ref 70–99)
Glucose-Capillary: 87 mg/dL (ref 70–99)

## 2021-02-16 SURGERY — LEFT HEART CATH AND CORONARY ANGIOGRAPHY
Anesthesia: LOCAL

## 2021-02-16 MED ORDER — VERAPAMIL HCL 2.5 MG/ML IV SOLN
INTRAVENOUS | Status: AC
  Filled 2021-02-16: qty 2

## 2021-02-16 MED ORDER — SODIUM CHLORIDE 0.9% FLUSH: 3.0000 mL | INTRAVENOUS | Status: DC | PRN

## 2021-02-16 MED ORDER — ACETAMINOPHEN 325 MG PO TABS: 650.0000 mg | ORAL_TABLET | ORAL | Status: DC | PRN

## 2021-02-16 MED ORDER — SODIUM CHLORIDE 0.9 % IV SOLN: 250.0000 mL | INTRAVENOUS | Status: DC | PRN

## 2021-02-16 MED ORDER — IOHEXOL 350 MG/ML SOLN
INTRAVENOUS | Status: DC | PRN
  Administered 2021-02-16: 45 mL

## 2021-02-16 MED ORDER — ONDANSETRON HCL 4 MG/2ML IJ SOLN: 4.0000 mg | Freq: Four times a day (QID) | INTRAMUSCULAR | Status: DC | PRN

## 2021-02-16 MED ORDER — HEPARIN (PORCINE) IN NACL 1000-0.9 UT/500ML-% IV SOLN
INTRAVENOUS | Status: AC
  Filled 2021-02-16: qty 1000

## 2021-02-16 MED ORDER — HYDRALAZINE HCL 20 MG/ML IJ SOLN: 10.0000 mg | INTRAMUSCULAR | Status: DC | PRN

## 2021-02-16 MED ORDER — FENTANYL CITRATE (PF) 100 MCG/2ML IJ SOLN
INTRAMUSCULAR | Status: DC | PRN
  Administered 2021-02-16: 25 ug via INTRAVENOUS

## 2021-02-16 MED ORDER — LISINOPRIL-HYDROCHLOROTHIAZIDE 20-25 MG PO TABS: 0.5000 | ORAL_TABLET | Freq: Every day | ORAL | 0 refills | Status: DC

## 2021-02-16 MED ORDER — HEPARIN SODIUM (PORCINE) 1000 UNIT/ML IJ SOLN
INTRAMUSCULAR | Status: DC | PRN
  Administered 2021-02-16: 5000 [IU] via INTRAVENOUS

## 2021-02-16 MED ORDER — LABETALOL HCL 5 MG/ML IV SOLN: 10.0000 mg | INTRAVENOUS | Status: DC | PRN

## 2021-02-16 MED ORDER — HEPARIN (PORCINE) IN NACL 1000-0.9 UT/500ML-% IV SOLN
INTRAVENOUS | Status: DC | PRN
  Administered 2021-02-16 (×2): 500 mL

## 2021-02-16 MED ORDER — ASPIRIN 81 MG PO CHEW
81.0000 mg | CHEWABLE_TABLET | ORAL | Status: DC
Start: 2021-02-16 — End: 2021-02-16

## 2021-02-16 MED ORDER — VERAPAMIL HCL 2.5 MG/ML IV SOLN
INTRAVENOUS | Status: DC | PRN
  Administered 2021-02-16: 10 mL via INTRA_ARTERIAL

## 2021-02-16 MED ORDER — MIDAZOLAM HCL 2 MG/2ML IJ SOLN
INTRAMUSCULAR | Status: DC | PRN
  Administered 2021-02-16: 1 mg via INTRAVENOUS

## 2021-02-16 MED ORDER — HEPARIN SODIUM (PORCINE) 1000 UNIT/ML IJ SOLN
INTRAMUSCULAR | Status: AC
  Filled 2021-02-16: qty 1

## 2021-02-16 MED ORDER — SODIUM CHLORIDE 0.9% FLUSH: 3.0000 mL | Freq: Two times a day (BID) | INTRAVENOUS | Status: DC

## 2021-02-16 MED ORDER — MIDAZOLAM HCL 2 MG/2ML IJ SOLN
INTRAMUSCULAR | Status: AC
  Filled 2021-02-16: qty 2

## 2021-02-16 MED ORDER — LIDOCAINE HCL (PF) 1 % IJ SOLN
INTRAMUSCULAR | Status: AC
  Filled 2021-02-16: qty 30

## 2021-02-16 MED ORDER — FENTANYL CITRATE (PF) 100 MCG/2ML IJ SOLN
INTRAMUSCULAR | Status: AC
  Filled 2021-02-16: qty 2

## 2021-02-16 MED ORDER — SODIUM CHLORIDE 0.9 % WEIGHT BASED INFUSION: 1.0000 mL/kg/h | INTRAVENOUS | Status: DC

## 2021-02-16 MED ORDER — SODIUM CHLORIDE 0.9 % WEIGHT BASED INFUSION
3.0000 mL/kg/h | INTRAVENOUS | Status: AC
  Administered 2021-02-16: 3 mL/kg/h via INTRAVENOUS

## 2021-02-16 MED ORDER — LIDOCAINE HCL (PF) 1 % IJ SOLN
INTRAMUSCULAR | Status: DC | PRN
  Administered 2021-02-16: 2 mL

## 2021-02-16 MED ORDER — SODIUM CHLORIDE 0.9 % IV SOLN: INTRAVENOUS | Status: DC

## 2021-02-16 SURGICAL SUPPLY — 12 items
CATH 5FR JL3.5 JR4 ANG PIG MP (CATHETERS) ×1 IMPLANT
COVER PRB 48X5XTLSCP FOLD TPE (BAG) IMPLANT
COVER PROBE 5X48 (BAG) ×2
DEVICE RAD COMP TR BAND LRG (VASCULAR PRODUCTS) ×1 IMPLANT
GLIDESHEATH SLEND SS 6F .021 (SHEATH) ×1 IMPLANT
GUIDEWIRE INQWIRE 1.5J.035X260 (WIRE) IMPLANT
INQWIRE 1.5J .035X260CM (WIRE) ×2
KIT HEART LEFT (KITS) ×2 IMPLANT
PACK CARDIAC CATHETERIZATION (CUSTOM PROCEDURE TRAY) ×2 IMPLANT
TRANSDUCER W/STOPCOCK (MISCELLANEOUS) ×2 IMPLANT
TUBING CIL FLEX 10 FLL-RA (TUBING) ×2 IMPLANT
WIRE HI TORQ VERSACORE-J 145CM (WIRE) ×1 IMPLANT

## 2021-02-16 NOTE — Interval H&P Note (Signed)
History and Physical Interval Note:  02/16/2021 7:34 AM  Marie Chen  has presented today for surgery, with the diagnosis of angina.  The various methods of treatment have been discussed with the patient and family. After consideration of risks, benefits and other options for treatment, the patient has consented to  Procedure(s): LEFT HEART CATH AND CORONARY ANGIOGRAPHY (N/A) as a surgical intervention.  The patient's history has been reviewed, patient examined, no change in status, stable for surgery.  I have reviewed the patient's chart and labs.  Questions were answered to the patient's satisfaction.    Cath Lab Visit (complete for each Cath Lab visit)  Clinical Evaluation Leading to the Procedure:   ACS: No.  Non-ACS:    Anginal Classification: CCS IV  Anti-ischemic medical therapy: No Therapy  Non-Invasive Test Results: No non-invasive testing performed  Prior CABG: No previous CABG  [name]

## 2021-02-16 NOTE — Progress Notes (Signed)
Pt had her Zestoretic cut in half today due to hypotension per Dr End, pt bp remains low 74/43, p 48, denies dizziness now but has been dizzy at home. Spoke with Lillia Abed NP, pt is to hold bp med till Saturday 9/10 and restart the new 1/2 dose strength if systolic above 100, she was informed to call Dr Kem Parkinson office and speak with on call staff about her bp readings if still running systaltically below 100. Pt verbalized understanding,

## 2021-02-16 NOTE — Discharge Instructions (Signed)
Hold Zestoretic till Saturday 02/18/21, take blood pressure, if top number is over 100 you may take to Zestoretic. If top number is below 100 call Dr Kem Parkinson office and speak to on call Dr to advise you on taking you blood pressure med.

## 2021-02-22 ENCOUNTER — Other Ambulatory Visit (INDEPENDENT_AMBULATORY_CARE_PROVIDER_SITE_OTHER): Payer: Medicare HMO

## 2021-02-22 ENCOUNTER — Other Ambulatory Visit: Payer: Self-pay

## 2021-02-22 DIAGNOSIS — E890 Postprocedural hypothyroidism: Secondary | ICD-10-CM

## 2021-02-22 LAB — TSH: TSH: 0.66 u[IU]/mL (ref 0.35–5.50)

## 2021-02-24 ENCOUNTER — Other Ambulatory Visit: Payer: Medicare HMO

## 2021-02-27 ENCOUNTER — Telehealth: Payer: Self-pay | Admitting: Cardiology

## 2021-02-27 NOTE — Telephone Encounter (Signed)
Report sent at this time.

## 2021-02-27 NOTE — Telephone Encounter (Signed)
[place] from [employer] requesting a copy of procedure on 02/16/21. Please advise

## 2021-03-03 ENCOUNTER — Other Ambulatory Visit: Payer: Self-pay

## 2021-03-03 ENCOUNTER — Ambulatory Visit (INDEPENDENT_AMBULATORY_CARE_PROVIDER_SITE_OTHER): Payer: Medicare HMO | Admitting: *Deleted

## 2021-03-03 DIAGNOSIS — E0800 Diabetes mellitus due to underlying condition with hyperosmolarity without nonketotic hyperglycemic-hyperosmolar coma (NKHHC): Secondary | ICD-10-CM

## 2021-03-03 DIAGNOSIS — M21611 Bunion of right foot: Secondary | ICD-10-CM

## 2021-03-03 DIAGNOSIS — L84 Corns and callosities: Secondary | ICD-10-CM

## 2021-03-03 DIAGNOSIS — Z794 Long term (current) use of insulin: Secondary | ICD-10-CM

## 2021-03-03 DIAGNOSIS — M21612 Bunion of left foot: Secondary | ICD-10-CM | POA: Diagnosis not present

## 2021-03-03 DIAGNOSIS — E1140 Type 2 diabetes mellitus with diabetic neuropathy, unspecified: Secondary | ICD-10-CM

## 2021-03-03 NOTE — Progress Notes (Signed)
Patient presents today to pick up diabetic shoes and insoles.  Patient was dispensed 1 pair of diabetic shoes and 3 pairs of foam casted diabetic insoles. Fit was satisfactory. Instructions for break-in and wear was reviewed and a copy was given to the patient.   Re-appointment for regularly scheduled diabetic foot care visits or if they should experience any trouble with the shoes or insoles.  

## 2021-03-21 ENCOUNTER — Other Ambulatory Visit: Payer: Self-pay

## 2021-03-23 ENCOUNTER — Ambulatory Visit: Payer: Medicare HMO | Admitting: Cardiology

## 2021-03-23 ENCOUNTER — Encounter: Payer: Self-pay | Admitting: Cardiology

## 2021-03-23 ENCOUNTER — Other Ambulatory Visit: Payer: Self-pay

## 2021-03-23 VITALS — BP 104/64 | HR 54 | Ht 67.6 in | Wt 248.6 lb

## 2021-03-23 DIAGNOSIS — I2510 Atherosclerotic heart disease of native coronary artery without angina pectoris: Secondary | ICD-10-CM | POA: Diagnosis not present

## 2021-03-23 DIAGNOSIS — I10 Essential (primary) hypertension: Secondary | ICD-10-CM

## 2021-03-23 DIAGNOSIS — E088 Diabetes mellitus due to underlying condition with unspecified complications: Secondary | ICD-10-CM | POA: Diagnosis not present

## 2021-03-23 DIAGNOSIS — E782 Mixed hyperlipidemia: Secondary | ICD-10-CM | POA: Diagnosis not present

## 2021-03-23 NOTE — Patient Instructions (Signed)

## 2021-03-23 NOTE — Progress Notes (Signed)
Cardiology Office Note:    Date:  03/23/2021   ID:  Marie Chen, DOB 20-Nov-1960, MRN [id number]  PCP:  [name], [name]  Cardiologist:  Garwin Brothers, MD   Referring MD: [name], [name]    ASSESSMENT:    1. Coronary artery disease involving native coronary artery of native heart without angina pectoris   2. Essential hypertension   3. Mixed hyperlipidemia   4. Diabetes mellitus due to underlying condition with unspecified complications (HCC)    PLAN:    In order of problems listed above:  Coronary artery disease: Secondary prevention stressed to the patient.  Importance of compliance with diet medication stressed and she vocalized understanding.  She was advised to walk at least half an hour a day 5 days a week and she promises to do so. Essential hypertension: Blood pressure stable and lifestyle modification was urged Mixed dyslipidemia, diabetes mellitus and obesity: I discussed with her extensively the risks of these conditions.  Weight reduction stressed risks of obesity explained and she promises to do better.  She is going to have blood work including fasting lipids by her endocrinologist in the next few days and promises to send me that lab work. Sleep apnea: Sleep health issues were discussed. Patient will be seen in follow-up appointment in 6 months or earlier if the patient has any concerns \   Medication Adjustments/Labs and Tests Ordered: Current medicines are reviewed at length with the patient today.  Concerns regarding medicines are outlined above.  No orders of the defined types were placed in this encounter.  No orders of the defined types were placed in this encounter.    No chief complaint on file.    History of Present Illness:    Marie Chen is a 60 y.o. female.  Patient has past medical history of coronary artery disease, essential hypertension, dyslipidemia diabetes mellitus and obesity.  She leads a sedentary lifestyle.  She  underwent coronary angiography for chest pain and this revealed disease which was to be treated only by medical measures.  Currently she is doing fine she is happy about the fact that her catheterization results are okay and she promises to take better care of herself.  At the time of my evaluation, the patient is alert awake oriented and in no distress.  Past Medical History:  Diagnosis Date   Acute hypoxemic respiratory failure due to severe acute respiratory syndrome coronavirus 2 (SARS-CoV-2) disease (HCC) 06/18/2019   Acute pharyngitis 05/23/2009   Qualifier: Diagnosis of  By: Felicity Coyer MD, Vikki Ports A    Allergic rhinitis 03/26/2007   Qualifier: Diagnosis of  By: Jonny Ruiz MD, Len Blalock   Formatting of this note might be different from the original. Overview:  Qualifier: Diagnosis of  By: Jonny Ruiz MD, Len Blalock   Angina pectoris (HCC) 12/04/2018   Asthma    severe   ASTHMA NOS W/ACUTE EXACERBATION 03/26/2007   Qualifier: Diagnosis of  By: Jonny Ruiz MD, Len Blalock    Asthma with exacerbation 03/26/2007   Formatting of this note might be different from the original. Overview:  Qualifier: Diagnosis of  By: Jonny Ruiz MD, Len Blalock   ASTHMATIC BRONCHITIS, ACUTE 04/27/2008   Qualifier: Diagnosis of  By: Jonny Ruiz MD, Len Blalock    Bronchitis, chronic (HCC) 04/22/2008   Qualifier: Diagnosis of  By: Thurnell Lose of this note might be different from the original. Overview:  Qualifier: Diagnosis of  By: Alesia Richards   CAD (  coronary artery disease) 01/01/2019   Carpal tunnel syndrome of left wrist 12/30/2019   Chronic obstructive pulmonary disease (HCC) 06/19/2019   Chronic pain syndrome 04/25/2017   Community acquired pneumonia 01/15/2020   Coronary artery disease    Cough with hemoptysis 05/11/2018   Depression    DEPRESSION 01/05/2007   Qualifier: Diagnosis of  By: Jonny Ruiz MD, Len Blalock    Diabetes mellitus (HCC) 10/07/2020   Diabetes mellitus due to underlying condition with unspecified complications  (HCC) 03/26/2007   Qualifier: Diagnosis of  By: Jonny Ruiz MD, Len Blalock    Diabetes mellitus type 2 in obese (HCC) 05/12/2018   Diabetes mellitus without complication (HCC)    type 2   DSORD CIRCADIAN RHY SHFT WRK SLEEP PHASE TYPE 03/26/2007   Qualifier: Diagnosis of  By: Jonny Ruiz MD, Len Blalock   Formatting of this note might be different from the original. Overview:  Qualifier: Diagnosis of  By: Jonny Ruiz MD, Len Blalock   Dyslipidemia 10/19/2019   DYSPHAGIA 04/27/2008   Qualifier: Diagnosis of  By: Marina Goodell MD, Wilhemina Bonito    Dyspnea    with exertion   Essential hypertension 01/05/2007   Qualifier: Diagnosis of  By: Jonny Ruiz MD, Len Blalock    Fatty liver    Fibromyalgia    GERD 01/05/2007   Qualifier: Diagnosis of  By: Jonny Ruiz MD, Len Blalock    GERD (gastroesophageal reflux disease)    HAMMER TOE 01/05/2007   Qualifier: Diagnosis of  By: Jonny Ruiz MD, Len Blalock    Headache 02/24/2009   Qualifier: Diagnosis of  By: Jonny Ruiz MD, Len Blalock   HEADACHE 02/24/2009   Qualifier: Diagnosis of  By: Jonny Ruiz MD, Len Blalock    Hemoptysis 05/11/2018   Hyperlipidemia 01/05/2007   diet controlled Formatting of this note might be different from the original. Overview:  Qualifier: Diagnosis of  By: Jonny Ruiz MD, Len Blalock   Hypertension    Hypothyroidism    Inflammatory arthritis 07/22/2020   IRRITABLE BOWEL SYNDROME, HX OF 03/26/2007   Qualifier: Diagnosis of  By: Jonny Ruiz MD, Len Blalock    Lumbar facet arthropathy 04/25/2017   Lumbar radiculopathy 09/19/2017   Migraine headache 04/22/2008   Formatting of this note might be different from the original. Overview:  Qualifier: Diagnosis of  By: Veryl Speak, CHRONIC 04/22/2008   Qualifier: Diagnosis of  By: Alesia Richards     Mixed dyslipidemia 01/05/2007   Qualifier: Diagnosis of  By: Jonny Ruiz MD, Len Blalock    Morbid obesity (HCC) 12/04/2018   Morbid obesity with BMI of 50.0-59.9, adult (HCC) 09/23/2019   Numbness 12/04/2019   Old disruption of left lateral collateral ligament 12/04/2019    Osteoarthritis 07/22/2020   Pain from implanted hardware 08/01/2017   Pain in left shoulder 07/13/2020   PEPTIC ULCER DISEASE 03/26/2007   Qualifier: Diagnosis of  By: Jonny Ruiz MD, Len Blalock    PERSONAL HISTORY ENDOCRN METABOLIC&IMMUNITY D/O 04/22/2008   Qualifier: Diagnosis of  By: Kem Boroughs, Barbara     Plantar callus 08/01/2017   Plantar fasciitis 08/01/2017   Pre-operative cardiovascular examination 07/25/2020   Pre-ulcerative corn or callous 01/30/2018   Preop cardiovascular exam 01/22/2020   Primary osteoarthritis of both hips 04/25/2017   Primary osteoarthritis of both knees 04/25/2017   RESTLESS LEG SYNDROME, HX OF 03/26/2007   Qualifier: Diagnosis of  By: Jonny Ruiz MD, Len Blalock    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 Overview:  Qualifier: Diagnosis of  By: Jonny Ruiz MD, Len Blalock   Thoracic spine pain 11/13/2017   Thyroid disease    Trigger finger, left little finger 12/30/2019   Trochanteric bursitis of left hip 03/02/2018   Type 2 diabetes mellitus with diabetic polyneuropathy, with long-term current use of insulin (HCC) 01/26/2020   Type 2 diabetes mellitus with hyperglycemia, without long-term current use of insulin (HCC) 10/19/2019    Past Surgical History:  Procedure Laterality Date   ANKLE SURGERY  2010   BACK SURGERY     CARPAL TUNNEL RELEASE Bilateral    CHOLECYSTECTOMY     COLONOSCOPY  04/2014   FOOT SURGERY     bunion surgery bialteral   HYSTEROSCOPY     LEFT HEART CATH AND CORONARY ANGIOGRAPHY N/A 12/19/2018   Procedure: LEFT HEART CATH AND CORONARY ANGIOGRAPHY;  Surgeon: Yvonne Kendall, MD;  Location: [facility];   Service: Cardiovascular;  Laterality: N/A;   LEFT HEART CATH AND CORONARY ANGIOGRAPHY N/A 02/16/2021   Procedure: LEFT HEART CATH AND CORONARY ANGIOGRAPHY;  Surgeon: Yvonne Kendall, MD;  Location: [facility];  Service: Cardiovascular;  Laterality: N/A;   LIGAMENT REPAIR Left 02/04/2020   Procedure: REPAIR RADIAL COLLATERAL LIGAMENT METACARPAL PHALANGEAL LEFT SMALL FINGER; POSSIBLE PINNING;  Surgeon: Cindee Salt, MD;  Location: [facility] OR;  Service: Orthopedics;  Laterality: Left;  AXILLARY BLOCK   RESECTION DISTAL CLAVICAL Right    STERIOD INJECTION Left 02/04/2020   Procedure: INJECTION LEFT CARPAL TUNNEL;  Surgeon: Cindee Salt, MD;  Location: [facility] OR;  Service: Orthopedics;  Laterality: Left;   TUBAL LIGATION     UPPER GI ENDOSCOPY  04/2014   WISDOM TOOTH EXTRACTION      Current Medications: Current Meds  Medication Sig   ACCU-CHEK AVIVA PLUS test strip USE AS INSTRUCTED TO TEST BLOOD SUGAR UP TO 3 TIMES DAILY   albuterol (PROVENTIL) (2.5 MG/3ML) 0.083% nebulizer solution Take 2.5 mg by nebulization every 6 (six) hours as needed for wheezing or shortness of breath.    albuterol (VENTOLIN HFA) 108 (90 Base) MCG/ACT inhaler Inhale 1-2 puffs into the lungs every 6 (six) hours as needed for wheezing or shortness of breath.   Alcohol Swabs (ALCOHOL PADS) 70 % PADS Use as directed   aspirin EC 81 MG tablet Take 1 tablet (81 mg total) by mouth daily.   calcium carbonate (TUMS - DOSED IN MG ELEMENTAL CALCIUM) 500 MG chewable tablet Chew 1 tablet by mouth 3 (three) times daily.   Carboxymethylcellul-Glycerin (LUBRICATING EYE DROPS OP) Place 1 drop into both eyes daily as needed for dry eyes (dry eyes).   escitalopram (LEXAPRO) 20 MG tablet Take 20 mg by mouth at bedtime.    Evolocumab (REPATHA) 140 MG/ML SOSY Inject 140 mg into the skin every 14 (fourteen) days.   fluconazole (DIFLUCAN) 150 MG tablet Take 150 mg by mouth daily as needed for other (burning sensation). Burning sensation   fluticasone  (FLONASE) 50 MCG/ACT nasal spray Place 1 spray into both nostrils daily as needed for allergies.    Fluticasone-Umeclidin-Vilant (TRELEGY ELLIPTA) 100-62.5-25 MCG/INH AEPB Inhale 1 puff into the lungs daily.    hydrochlorothiazide (HYDRODIURIL) 25 MG tablet  Take 25 mg by mouth daily.   insulin aspart (NOVOLOG FLEXPEN) 100 UNIT/ML FlexPen Inject 2-3 Units into the skin daily as needed for high blood sugar (over 175).   levothyroxine (SYNTHROID) 112 MCG tablet Take 112 mcg by mouth daily.   lisinopril (ZESTRIL) 10 MG tablet Take 10 mg by mouth daily.   Multiple Vitamins-Minerals (BARIATRIC MULTIVITAMINS/IRON PO) Take 1 tablet by mouth daily.   nitroGLYCERIN (NITROSTAT) 0.4 MG SL tablet Place 1 tablet (0.4 mg total) under the tongue every 5 (five) minutes as needed for chest pain.   nystatin (MYCOSTATIN/NYSTOP) powder Apply 1 application topically 2 (two) times daily as needed for dry skin (dry skin).   omeprazole (PRILOSEC) 40 MG capsule Take 40 mg by mouth daily.   OVER THE COUNTER MEDICATION Take 300 mg by mouth at bedtime as needed for sleep (sleep). CBD oil    oxyCODONE-acetaminophen (PERCOCET) 7.5-325 MG tablet Take 1 tablet by mouth every 6 (six) hours as needed for severe pain.   pregabalin (LYRICA) 150 MG capsule Take 150 mg by mouth 3 (three) times daily.   terconazole (TERAZOL 7) 0.4 % vaginal cream Place 1 applicator vaginally at bedtime as needed for other (yeast infections). Yeast infection    topiramate (TOPAMAX) 25 MG tablet Take 25 mg by mouth at bedtime.   Vitamin D, Ergocalciferol, (DRISDOL) 1.25 MG (50000 UT) CAPS capsule Take 50,000 Units by mouth every Saturday.     Allergies:   Macrodantin [nitrofurantoin macrocrystal], Metformin and related, Atorvastatin, Hydromorphone hcl, Lipitor [atorvastatin calcium], Meperidine hcl, and Tetracyclines & related   Social History   Socioeconomic History   Marital status: Widowed    Spouse name: Not on file   Number of children: 4    Years of education: Not on file   Highest education level: Not on file  Occupational History   Occupation: CSR    Employer: [employer]  Tobacco Use   Smoking status: Former    Types: Cigarettes    Quit date: 06/15/2019    Years since quitting: 1.7   Smokeless tobacco: Never  Vaping Use   Vaping Use: Never used  Substance and Sexual Activity   Alcohol use: No    Alcohol/week: 0.0 standard drinks   Drug use: No   Sexual activity: Yes    Birth control/protection: Post-menopausal  Other Topics Concern   Not on file  Social History Narrative   Not on file   Social Determinants of Health   Financial Resource Strain: Not on file  Food Insecurity: Not on file  Transportation Needs: Not on file  Physical Activity: Not on file  Stress: Not on file  Social Connections: Not on file     Family History: The patient's family history includes Heart failure in her father. There is no history of Colon cancer.  ROS:   Please see the history of present illness.    All other systems reviewed and are negative.  EKGs/Labs/Other Studies Reviewed:    The following studies were reviewed today: End, Cristal Deer, MD (Primary)     Procedures  LEFT HEART CATH AND CORONARY ANGIOGRAPHY   Conclusion  Conclusions: Mild-moderate, nonobstructive coronary artery disease.  Ostial D1 and mid LAD disease has progressed slightly compared to prior catheterization in 2020 with up to 40-50% stenosis now present. Grossly normal left ventricular systolic function (suboptimal opacification of the left ventricle with hand-injection) with mildly elevated filling pressure (LVEDP 21 mmHg).   Recommendations: Continue medical therapy and risk factor modification. Consider echocardiogram for further  assessment of LVEF and other potential causes for the patient's symptoms.   Yvonne Kendall, MD [facility]   Recent Labs: 08/26/2020: ALT 14 02/10/2021: BUN 15; Creatinine, Ser 0.74; Hemoglobin 13.8;  Platelets 229; Potassium 4.0; Sodium 140 02/22/2021: TSH 0.66  Recent Lipid Panel    Component Value Date/Time   CHOL 105 08/26/2020 1059   CHOL 179 01/07/2019 1019   TRIG 74.0 08/26/2020 1059   HDL 44.60 08/26/2020 1059   HDL 34 (L) 01/07/2019 1019   CHOLHDL 2 08/26/2020 1059   VLDL 14.8 08/26/2020 1059   LDLCALC 45 08/26/2020 1059   LDLCALC 123 (H) 01/07/2019 1019   LDLDIRECT 179.5 03/14/2009 1147    Physical Exam:    VS:  BP 104/64   Pulse (!) 54   Ht 5' 7.6" (1.717 m)   Wt 248 lb 9.6 oz (112.8 kg)   LMP  (LMP Unknown)   SpO2 94%   BMI 38.25 kg/m     Wt Readings from Last 3 Encounters:  03/23/21 248 lb 9.6 oz (112.8 kg)  02/16/21 247 lb (112 kg)  02/02/21 251 lb 3.2 oz (113.9 kg)     GEN: Patient is in no acute distress HEENT: Normal NECK: No JVD; No carotid bruits LYMPHATICS: No lymphadenopathy CARDIAC: Hear sounds regular, 2/6 systolic murmur at the apex. RESPIRATORY:  Clear to auscultation without rales, wheezing or rhonchi  ABDOMEN: Soft, non-tender, non-distended MUSCULOSKELETAL:  No edema; No deformity  SKIN: Warm and dry NEUROLOGIC:  Alert and oriented x 3 PSYCHIATRIC:  Normal affect   Signed, Garwin Brothers, MD  03/23/2021 12:53 PM     [facility]

## 2021-04-05 ENCOUNTER — Other Ambulatory Visit: Payer: Self-pay

## 2021-04-05 ENCOUNTER — Encounter: Payer: Self-pay | Admitting: Sports Medicine

## 2021-04-05 ENCOUNTER — Ambulatory Visit (INDEPENDENT_AMBULATORY_CARE_PROVIDER_SITE_OTHER): Payer: Medicare HMO | Admitting: Sports Medicine

## 2021-04-05 DIAGNOSIS — M79674 Pain in right toe(s): Secondary | ICD-10-CM

## 2021-04-05 DIAGNOSIS — M21611 Bunion of right foot: Secondary | ICD-10-CM

## 2021-04-05 DIAGNOSIS — B351 Tinea unguium: Secondary | ICD-10-CM

## 2021-04-05 DIAGNOSIS — M79675 Pain in left toe(s): Secondary | ICD-10-CM

## 2021-04-05 DIAGNOSIS — M21612 Bunion of left foot: Secondary | ICD-10-CM

## 2021-04-05 DIAGNOSIS — L84 Corns and callosities: Secondary | ICD-10-CM

## 2021-04-05 DIAGNOSIS — Z794 Long term (current) use of insulin: Secondary | ICD-10-CM

## 2021-04-05 DIAGNOSIS — E0800 Diabetes mellitus due to underlying condition with hyperosmolarity without nonketotic hyperglycemic-hyperosmolar coma (NKHHC): Secondary | ICD-10-CM

## 2021-04-05 NOTE — Progress Notes (Signed)
Subjective: Marie Chen is a 60 y.o. female patient with history of diabetes who presents to office today complaining of long,mildly painful nails and callus unable to trim.  Patient reports that her bunions still bother her especially the right 1 patient currently using gel bunion cushions that seemed to not help.  Patient reports that she got her diabetic shoes last month.  Patient denies any other pedal complaints at this time.  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   Diabetes mellitus ([place]) 10/07/2020   Pre-operative cardiovascular examination 07/25/2020   Osteoarthritis 07/22/2020   Inflammatory arthritis 07/22/2020   Asthma    Coronary artery disease    Diabetes mellitus without complication ([place])    Dyspnea    Fatty liver    Fibromyalgia    Sjogren's disease ([place])    Sleep apnea    Thyroid disease    Pain in left shoulder 07/13/2020   Type 2 diabetes mellitus with diabetic polyneuropathy, with long-term current use of insulin ([place]) 01/26/2020   Preop cardiovascular exam 01/22/2020   Community acquired pneumonia 01/15/2020   Carpal tunnel syndrome of left wrist 12/30/2019   Trigger finger, left little finger 12/30/2019   Numbness 12/04/2019   Old disruption of left lateral collateral ligament 12/04/2019   Type 2 diabetes mellitus with hyperglycemia, without long-term current use of insulin ([place]) 10/19/2019   Dyslipidemia 10/19/2019   Morbid obesity with BMI of 50.0-59.9, adult ([place]) 09/23/2019   Chronic obstructive pulmonary disease ([place]) 06/19/2019   Hypertension    GERD (gastroesophageal reflux disease)    Acute hypoxemic respiratory failure due to severe acute respiratory syndrome coronavirus 2 (SARS-CoV-2) disease ([place]) 06/18/2019   CAD (coronary artery disease) 01/01/2019   Morbid obesity  ([place]) 12/04/2018   Angina pectoris ([place]) 12/04/2018   Diabetes mellitus type 2 in obese ([place]) 05/12/2018   Cough with hemoptysis 05/11/2018   Hemoptysis 05/11/2018   Trochanteric bursitis of left hip 03/02/2018   Pre-ulcerative corn or callous 01/30/2018   Thoracic spine pain 11/13/2017   Lumbar radiculopathy 09/19/2017   Pain from implanted hardware 08/01/2017   Plantar callus 08/01/2017   Plantar fasciitis 08/01/2017   Chronic pain syndrome 04/25/2017   Lumbar facet arthropathy 04/25/2017   Primary osteoarthritis of both hips 04/25/2017   Primary osteoarthritis of both knees 04/25/2017   Segmental dysfunction of lumbar region 04/25/2017   ACUTE PHARYNGITIS 05/23/2009   SINUSITIS- ACUTE-NOS 02/24/2009   HEADACHE 02/24/2009   Headache 02/24/2009   ASTHMATIC BRONCHITIS, ACUTE 04/27/2008   DYSPHAGIA 04/27/2008   MIGRAINE, CHRONIC 04/22/2008   Bronchitis, chronic ([place]) 04/22/2008   PERSONAL HISTORY ENDOCRN METABOLIC&IMMUNITY D/O 11/91/4782   Migraine headache 04/22/2008   Diabetes mellitus due to underlying condition with unspecified complications ([place]) 95/62/1308   DSORD CIRCADIAN RHY SHFT [place] SLEEP PHASE TYPE 03/26/2007   Allergic rhinitis 03/26/2007   ASTHMA NOS W/ACUTE EXACERBATION 03/26/2007   PEPTIC ULCER DISEASE 03/26/2007   RESTLESS LEG SYNDROME, HX OF 03/26/2007   IRRITABLE BOWEL SYNDROME, HX OF 03/26/2007   Asthma with exacerbation 03/26/2007   Hypothyroidism 01/05/2007   Mixed dyslipidemia 01/05/2007   DEPRESSION 01/05/2007   Sleep apnea, obstructive 01/05/2007   Essential hypertension 01/05/2007   GERD 01/05/2007   HAMMER TOE 01/05/2007   Depression 01/05/2007   Hyperlipidemia 01/05/2007   Current Outpatient Medications on File Prior to  Visit  Medication Sig Dispense Refill   ACCU-CHEK AVIVA PLUS test strip USE AS INSTRUCTED TO TEST BLOOD SUGAR UP TO 3 TIMES DAILY 300 strip 3   albuterol (PROVENTIL) (2.5 MG/3ML) 0.083% nebulizer solution Take 2.5 mg by  nebulization every 6 (six) hours as needed for wheezing or shortness of breath.      albuterol (VENTOLIN HFA) 108 (90 Base) MCG/ACT inhaler Inhale 1-2 puffs into the lungs every 6 (six) hours as needed for wheezing or shortness of breath.     Alcohol Swabs (ALCOHOL PADS) 70 % PADS Use as directed 1 each 3   aspirin EC 81 MG tablet Take 1 tablet (81 mg total) by mouth daily. 90 tablet 3   calcium carbonate (TUMS - DOSED IN MG ELEMENTAL CALCIUM) 500 MG chewable tablet Chew 1 tablet by mouth 3 (three) times daily.     Carboxymethylcellul-Glycerin (LUBRICATING EYE DROPS OP) Place 1 drop into both eyes daily as needed for dry eyes (dry eyes).     escitalopram (LEXAPRO) 20 MG tablet Take 20 mg by mouth at bedtime.      Evolocumab (REPATHA) 140 MG/ML SOSY Inject 140 mg into the skin every 14 (fourteen) days. 2.1 mL 11   fluconazole (DIFLUCAN) 150 MG tablet Take 150 mg by mouth daily as needed for other (burning sensation). Burning sensation     fluticasone (FLONASE) 50 MCG/ACT nasal spray Place 1 spray into both nostrils daily as needed for allergies.      Fluticasone-Umeclidin-Vilant (TRELEGY ELLIPTA) 100-62.5-25 MCG/INH AEPB Inhale 1 puff into the lungs daily.      hydrochlorothiazide (HYDRODIURIL) 25 MG tablet Take 25 mg by mouth daily.     insulin aspart (NOVOLOG FLEXPEN) 100 UNIT/ML FlexPen Inject 2-3 Units into the skin daily as needed for high blood sugar (over 175).     levothyroxine (SYNTHROID) 112 MCG tablet Take 112 mcg by mouth daily.     lisinopril (ZESTRIL) 10 MG tablet Take 10 mg by mouth daily.     Multiple Vitamins-Minerals (BARIATRIC MULTIVITAMINS/IRON PO) Take 1 tablet by mouth daily.     nitroGLYCERIN (NITROSTAT) 0.4 MG SL tablet Place 1 tablet (0.4 mg total) under the tongue every 5 (five) minutes as needed for chest pain. 25 tablet 6   nystatin (MYCOSTATIN/NYSTOP) powder Apply 1 application topically 2 (two) times daily as needed for dry skin (dry skin).     omeprazole (PRILOSEC) 40  MG capsule Take 40 mg by mouth daily.     OVER THE COUNTER MEDICATION Take 300 mg by mouth at bedtime as needed for sleep (sleep). CBD oil      oxyCODONE-acetaminophen (PERCOCET) 7.5-325 MG tablet Take 1 tablet by mouth every 6 (six) hours as needed for severe pain.     pregabalin (LYRICA) 150 MG capsule Take 150 mg by mouth 3 (three) times daily.     terconazole (TERAZOL 7) 0.4 % vaginal cream Place 1 applicator vaginally at bedtime as needed for other (yeast infections). Yeast infection      topiramate (TOPAMAX) 25 MG tablet Take 25 mg by mouth at bedtime.     Vitamin D, Ergocalciferol, (DRISDOL) 1.25 MG (50000 UT) CAPS capsule Take 50,000 Units by mouth every Saturday.     No current facility-administered medications on file prior to visit.   Allergies  Allergen Reactions   Macrodantin [Nitrofurantoin Macrocrystal] Rash   Metformin And Related Other (See Comments)    Causes yeast infections   Atorvastatin Diarrhea   Hydromorphone Hcl Nausea And Vomiting  Lipitor [Atorvastatin Calcium] Diarrhea   Meperidine Hcl Nausea Only    Is ok if given with Phenergan   Tetracyclines & Related Rash    Recent Results (from the past 2160 hour(s))  Basic metabolic panel     Status: Abnormal   Collection Time: 02/10/21 10:38 AM  Result Value Ref Range   Glucose 101 (H) 65 - 99 mg/dL   BUN 15 8 - 27 mg/dL   Creatinine, Ser 0.74 0.57 - 1.00 mg/dL   eGFR 92 >59 mL/min/1.73   BUN/Creatinine Ratio 20 12 - 28   Sodium 140 134 - 144 mmol/L   Potassium 4.0 3.5 - 5.2 mmol/L   Chloride 103 96 - 106 mmol/L   CO2 24 20 - 29 mmol/L   Calcium 9.6 8.7 - 10.3 mg/dL  CBC with Differential/Platelet     Status: None   Collection Time: 02/10/21 10:38 AM  Result Value Ref Range   WBC 7.8 3.4 - 10.8 x10E3/uL   RBC 4.56 3.77 - 5.28 x10E6/uL   Hemoglobin 13.8 11.1 - 15.9 g/dL   Hematocrit 42.0 34.0 - 46.6 %   MCV 92 79 - 97 fL   MCH 30.3 26.6 - 33.0 pg   MCHC 32.9 31.5 - 35.7 g/dL   RDW 13.8 11.7 - 15.4 %    Platelets 229 150 - 450 x10E3/uL   Neutrophils 55 Not Estab. %   Lymphs 36 Not Estab. %   Monocytes 6 Not Estab. %   Eos 2 Not Estab. %   Basos 1 Not Estab. %   Neutrophils Absolute 4.3 1.4 - 7.0 x10E3/uL   Lymphocytes Absolute 2.8 0.7 - 3.1 x10E3/uL   Monocytes Absolute 0.4 0.1 - 0.9 x10E3/uL   EOS (ABSOLUTE) 0.1 0.0 - 0.4 x10E3/uL   Basophils Absolute 0.1 0.0 - 0.2 x10E3/uL   Immature Granulocytes 0 Not Estab. %   Immature Grans (Abs) 0.0 0.0 - 0.1 x10E3/uL  Glucose, capillary     Status: Abnormal   Collection Time: 02/16/21  6:13 AM  Result Value Ref Range   Glucose-Capillary 109 (H) 70 - 99 mg/dL    Comment: Glucose reference range applies only to samples taken after fasting for at least 8 hours.   Comment 1 Notify RN    Comment 2 Document in Chart   Glucose, capillary     Status: None   Collection Time: 02/16/21  8:33 AM  Result Value Ref Range   Glucose-Capillary 87 70 - 99 mg/dL    Comment: Glucose reference range applies only to samples taken after fasting for at least 8 hours.  TSH     Status: None   Collection Time: 02/22/21 10:50 AM  Result Value Ref Range   TSH 0.66 0.35 - 5.50 uIU/mL    Objective: General: Patient is awake, alert, and oriented x 3 and in no acute distress.  Integument: Skin is warm, dry and supple bilateral. Nails are tender, long, thickened and dystrophic with subungual debris, consistent with onychomycosis, 1-5 bilateral.  Callus to first toes present bilateral. Remaining integument unremarkable.  Neurovascular status intact bilateral  Musculoskeletal: Unchanged bunion deformity right more symptomatic than left.  Pes planus foot type.  Assessment and Plan: Problem List Items Addressed This Visit       Endocrine   Diabetes mellitus ([place])   Other Visit Diagnoses     Pain due to onychomycosis of toenails of both feet    -  Primary   Callus       Bilateral  bunions       Right more symptomatic than left       -Examined  patient. -Discussed and educated patient on diabetic foot care, especially with  regards to the vascular, neurological and musculoskeletal systems.  -Stressed the importance of good glycemic control and the detriment of not  controlling glucose levels in relation to the foot. -Mechanically debrided all nails 1-5 bilateral using sterile nail nipper and filed with dremel without incident  -Mechanically debrided callus x2 using a sterile 15 blade without incident -Advised patient if her bunions still hurt at next visit we will x-ray and further plan surgery for her right bunion -Answered all patient questions -Patient to return  in 3 months for at risk foot care -Patient advised to call the office if any problems or questions arise in the meantime.  Landis Martins, DPM

## 2021-05-01 ENCOUNTER — Other Ambulatory Visit: Payer: Self-pay | Admitting: Internal Medicine

## 2021-05-11 ENCOUNTER — Other Ambulatory Visit: Payer: Self-pay

## 2021-05-11 ENCOUNTER — Encounter: Payer: Self-pay | Admitting: Internal Medicine

## 2021-05-11 MED ORDER — LEVOTHYROXINE SODIUM 88 MCG PO TABS: 88.0000 ug | ORAL_TABLET | Freq: Every day | ORAL | 3 refills | Status: DC

## 2021-06-06 ENCOUNTER — Other Ambulatory Visit (HOSPITAL_COMMUNITY): Payer: Self-pay

## 2021-06-06 ENCOUNTER — Telehealth: Payer: Self-pay | Admitting: Pharmacy Technician

## 2021-06-06 NOTE — Telephone Encounter (Signed)
Patient Advocate Encounter  Received notification from [employer] [facility]) that RENEWAL prior authorization for REPATHA 140MG  is required.   PA submitted on 12.27.22 [place]  [id number] Status is pending   [facility] will continue to follow  12.29.22, CPhT Patient Advocate [facility] Phone: [phone] Fax:  [phone]  Received notification from [employer] [facility]) regarding a prior authorization for REPATHA 140MG /ML. Authorization has been APPROVED from 01.01.22 to 12.31.23.

## 2021-07-03 ENCOUNTER — Other Ambulatory Visit: Payer: Self-pay

## 2021-07-03 ENCOUNTER — Ambulatory Visit (INDEPENDENT_AMBULATORY_CARE_PROVIDER_SITE_OTHER): Payer: Medicare HMO | Admitting: Internal Medicine

## 2021-07-03 ENCOUNTER — Encounter: Payer: Self-pay | Admitting: Internal Medicine

## 2021-07-03 VITALS — BP 114/70 | HR 64 | Ht 67.6 in | Wt 248.0 lb

## 2021-07-03 DIAGNOSIS — E1165 Type 2 diabetes mellitus with hyperglycemia: Secondary | ICD-10-CM | POA: Diagnosis not present

## 2021-07-03 DIAGNOSIS — R5383 Other fatigue: Secondary | ICD-10-CM

## 2021-07-03 DIAGNOSIS — E11A Type 2 diabetes mellitus without complications in remission: Secondary | ICD-10-CM | POA: Insufficient documentation

## 2021-07-03 DIAGNOSIS — E119 Type 2 diabetes mellitus without complications: Secondary | ICD-10-CM

## 2021-07-03 DIAGNOSIS — E890 Postprocedural hypothyroidism: Secondary | ICD-10-CM

## 2021-07-03 HISTORY — DX: Type 2 diabetes mellitus without complications: E11.9

## 2021-07-03 HISTORY — DX: Other fatigue: R53.83

## 2021-07-03 LAB — BASIC METABOLIC PANEL
BUN: 26 mg/dL — ABNORMAL HIGH (ref 6–23)
CO2: 31 mEq/L (ref 19–32)
Calcium: 9.8 mg/dL (ref 8.4–10.5)
Chloride: 102 mEq/L (ref 96–112)
Creatinine, Ser: 0.76 mg/dL (ref 0.40–1.20)
GFR: 84.01 mL/min (ref >60.00)
Glucose, Bld: 80 mg/dL (ref 70–99)
Potassium: 4 mEq/L (ref 3.5–5.1)
Sodium: 140 mEq/L (ref 135–145)

## 2021-07-03 LAB — CBC
HCT: 40.9 % (ref 36.0–46.0)
Hemoglobin: 13.4 g/dL (ref 12.0–15.0)
MCHC: 32.8 g/dL (ref 30.0–36.0)
MCV: 92.4 fl (ref 78.0–100.0)
Platelets: 217 10*3/uL (ref 150.0–400.0)
RBC: 4.43 Mil/uL (ref 3.87–5.11)
RDW: 14.6 % (ref 11.5–15.5)
WBC: 8.1 10*3/uL (ref 4.0–10.5)

## 2021-07-03 LAB — VITAMIN B12: Vitamin B-12: 308 pg/mL (ref 211–911)

## 2021-07-03 LAB — VITAMIN D 25 HYDROXY (VIT D DEFICIENCY, FRACTURES): VITD: 67.08 ng/mL (ref 30.00–100.00)

## 2021-07-03 LAB — TSH: TSH: 0.47 u[IU]/mL (ref 0.35–5.50)

## 2021-07-03 LAB — POCT GLYCOSYLATED HEMOGLOBIN (HGB A1C): Hemoglobin A1C: 5.9 % — AB (ref 4.0–5.6)

## 2021-07-03 LAB — T4, FREE: Free T4: 0.79 ng/dL (ref 0.60–1.60)

## 2021-07-03 MED ORDER — LEVOTHYROXINE SODIUM 88 MCG PO TABS: 88.0000 ug | ORAL_TABLET | Freq: Every day | ORAL | 3 refills | Status: DC

## 2021-07-03 NOTE — Progress Notes (Signed)
Name: Marie Chen  Age/ Sex: 61 y.o., female   MRN/ DOB: [id number], 10-Aug-1960     PCP: [name], [name]   Reason for Endocrinology Evaluation: Type 2 Diabetes Mellitus  Initial Endocrine Consultative Visit: 10/19/2019    PATIENT IDENTIFIER: [name] is a 61 y.o. 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 The patient has followed with Endocrinology clinic since 10/19/2019 for consultative assistance with management of her diabetes.  DIABETIC HISTORY:  Marie Chen was diagnosed with DM in 2015. Pt endorses genital skin irritation secondary to Metformin ?Marcelline Deist caused genital infections. Her hemoglobin A1c has ranged from 8.8% in 03/2019 , peaking at 10.5% in 2021.  On her initial visit to  Our clinic her A1c 9.0% . We adjust MDI regimen     THYROID HISTORY:  In 1994 she had RAI ablation secondary to hyperthyroidism. She needed LT-4 replacement shortly after RAI ablation .    Mother with cushing syndrome    S/P sleeve gastrectomy 10/03/2020, her A1c was 5.4 % and we stopped basal insulin and was only provided with novolog per correction scale    SUBJECTIVE:     During the last visit (12/31/2019): A1c 6.0 % . Remained off glycemic agents    Today (07/03/2021): [name] is here for a follow up on diabetes management.  She checks her blood sugars weekly.    S/P gastric sleeve sx 10/03/2020 Weight stable  She is tired , falls asleep al the time  Denies constipation or diarrhea  Compliant with LT- 4 replacement   She is on CPAP, pending another sleep study    HOME ENDOCRINE REGIMEN:  Levothyroxine 88 mcg daily  Bariatric Vitamin  ( iron: 45 , B12 1000 mcg , D: 3000 iu    Statin: yes ACE-I/ARB: yes    METER DOWNLOAD SUMMARY: Unable to download   95-104 mg/dL     DIABETIC COMPLICATIONS: Microvascular complications:   Neuropathy  Denies: CKD, retinopathy  Last eye exam: scheduled next month    Macrovascular complications:  CAD ( medically treated per pt)  Denies: CAD, PVD, CVA   HISTORY:  Past Medical History:  Past Medical History:  Diagnosis Date   Acute hypoxemic respiratory failure due to severe acute respiratory syndrome coronavirus 2 (SARS-CoV-2) disease (HCC) 06/18/2019   Acute pharyngitis 05/23/2009   Qualifier: Diagnosis of  By: Felicity Coyer MD, Vikki Ports A    Allergic rhinitis 03/26/2007   Qualifier: Diagnosis of  By: Jonny Ruiz MD, Len Blalock   Formatting of this note might be different from the original. Overview:  Qualifier: Diagnosis of  By: Jonny Ruiz MD, Len Blalock   Angina pectoris (HCC) 12/04/2018   Asthma    severe   ASTHMA NOS W/ACUTE EXACERBATION 03/26/2007   Qualifier: Diagnosis of  By: Jonny Ruiz MD, Len Blalock    Asthma with exacerbation 03/26/2007   Formatting of this note might be different from the original. Overview:  Qualifier: Diagnosis of  By: Jonny Ruiz MD, Len Blalock   ASTHMATIC BRONCHITIS, ACUTE 04/27/2008   Qualifier: Diagnosis of  By: Jonny Ruiz MD, Len Blalock    Bronchitis, chronic (HCC) 04/22/2008   Qualifier: Diagnosis of  By: Thurnell Lose of this note might be different from the original. Overview:  Qualifier: Diagnosis of  By: Alesia Richards   CAD (coronary artery disease) 01/01/2019   Carpal tunnel syndrome of left  wrist 12/30/2019   Chronic obstructive pulmonary disease (HCC) 06/19/2019   Chronic pain syndrome 04/25/2017   Community acquired pneumonia 01/15/2020   Coronary artery disease    Cough with hemoptysis 05/11/2018   Depression    DEPRESSION 01/05/2007   Qualifier: Diagnosis of  By: Jonny Ruiz MD, Len Blalock    Diabetes mellitus (HCC) 10/07/2020   Diabetes mellitus due to underlying condition with unspecified complications (HCC) 03/26/2007   Qualifier: Diagnosis of  By: Jonny Ruiz MD, Len Blalock    Diabetes mellitus type 2 in obese (HCC) 05/12/2018   Diabetes mellitus without  complication (HCC)    type 2   DSORD CIRCADIAN RHY SHFT WRK SLEEP PHASE TYPE 03/26/2007   Qualifier: Diagnosis of  By: Jonny Ruiz MD, Len Blalock   Formatting of this note might be different from the original. Overview:  Qualifier: Diagnosis of  By: Jonny Ruiz MD, Len Blalock   Dyslipidemia 10/19/2019   DYSPHAGIA 04/27/2008   Qualifier: Diagnosis of  By: Marina Goodell MD, Wilhemina Bonito    Dyspnea    with exertion   Essential hypertension 01/05/2007   Qualifier: Diagnosis of  By: Jonny Ruiz MD, Len Blalock    Fatty liver    Fibromyalgia    GERD 01/05/2007   Qualifier: Diagnosis of  By: Jonny Ruiz MD, Len Blalock    GERD (gastroesophageal reflux disease)    HAMMER TOE 01/05/2007   Qualifier: Diagnosis of  By: Jonny Ruiz MD, Len Blalock    Headache 02/24/2009   Qualifier: Diagnosis of  By: Jonny Ruiz MD, Len Blalock   HEADACHE 02/24/2009   Qualifier: Diagnosis of  By: Jonny Ruiz MD, Len Blalock    Hemoptysis 05/11/2018   Hyperlipidemia 01/05/2007   diet controlled Formatting of this note might be different from the original. Overview:  Qualifier: Diagnosis of  By: Jonny Ruiz MD, Len Blalock   Hypertension    Hypothyroidism    Inflammatory arthritis 07/22/2020   IRRITABLE BOWEL SYNDROME, HX OF 03/26/2007   Qualifier: Diagnosis of  By: Jonny Ruiz MD, Len Blalock    Lumbar facet arthropathy 04/25/2017   Lumbar radiculopathy 09/19/2017   Migraine headache 04/22/2008   Formatting of this note might be different from the original. Overview:  Qualifier: Diagnosis of  By: Veryl Speak, CHRONIC 04/22/2008   Qualifier: Diagnosis of  By: Alesia Richards     Mixed dyslipidemia 01/05/2007   Qualifier: Diagnosis of  By: Jonny Ruiz MD, Len Blalock    Morbid obesity (HCC) 12/04/2018   Morbid obesity with BMI of 50.0-59.9, adult (HCC) 09/23/2019   Numbness 12/04/2019   Old disruption of left lateral collateral ligament 12/04/2019   Osteoarthritis 07/22/2020   Pain from implanted hardware 08/01/2017   Pain in left shoulder 07/13/2020   PEPTIC ULCER DISEASE 03/26/2007   Qualifier:  Diagnosis of  By: Jonny Ruiz MD, Len Blalock    PERSONAL HISTORY ENDOCRN METABOLIC&IMMUNITY D/O 04/22/2008   Qualifier: Diagnosis of  By: Kem Boroughs, Barbara     Plantar callus 08/01/2017   Plantar fasciitis 08/01/2017   Pre-operative cardiovascular examination 07/25/2020   Pre-ulcerative corn or callous 01/30/2018   Preop cardiovascular exam 01/22/2020   Primary osteoarthritis of both hips 04/25/2017   Primary osteoarthritis of both knees 04/25/2017   RESTLESS LEG SYNDROME, HX OF 03/26/2007   Qualifier: Diagnosis of  By: Jonny Ruiz MD, Len Blalock    S/P laparoscopic sleeve gastrectomy 10/12/2020   Segmental dysfunction of lumbar region 04/25/2017   SINUSITIS- ACUTE-NOS 02/24/2009   Qualifier: Diagnosis of  By: Jonny Ruiz MD, Len Blalock  Sjogren's disease (HCC)    Sleep apnea    CPAP nightly   Sleep apnea, obstructive 01/05/2007   Qualifier: Diagnosis of  By: Jonny RuizJohn MD, Len BlalockJames W   Formatting of this note might be different from the original. Overview:  Qualifier: Diagnosis of  By: Jonny RuizJohn MD, Len BlalockJames W   Thoracic spine pain 11/13/2017   Thyroid disease    Trigger finger, left little finger 12/30/2019   Trochanteric bursitis of left hip 03/02/2018   Type 2 diabetes mellitus with diabetic polyneuropathy, with long-term current use of insulin (HCC) 01/26/2020   Type 2 diabetes mellitus with hyperglycemia, without long-term current use of insulin (HCC) 10/19/2019   Past Surgical History:  Past Surgical History:  Procedure Laterality Date   ANKLE SURGERY  2010   BACK SURGERY     CARPAL TUNNEL RELEASE Bilateral    CHOLECYSTECTOMY     COLONOSCOPY  04/2014   FOOT SURGERY     bunion surgery bialteral   HYSTEROSCOPY     LEFT HEART CATH AND CORONARY ANGIOGRAPHY N/A 12/19/2018   Procedure: LEFT HEART CATH AND CORONARY ANGIOGRAPHY;  Surgeon: Yvonne KendallEnd, Christopher, MD;  Location: [facility];  Service: Cardiovascular;  Laterality: N/A;   LEFT HEART CATH AND CORONARY ANGIOGRAPHY N/A 02/16/2021   Procedure: LEFT HEART CATH AND  CORONARY ANGIOGRAPHY;  Surgeon: Yvonne KendallEnd, Christopher, MD;  Location: [facility];  Service: Cardiovascular;  Laterality: N/A;   LIGAMENT REPAIR Left 02/04/2020   Procedure: REPAIR RADIAL COLLATERAL LIGAMENT METACARPAL PHALANGEAL LEFT SMALL FINGER; POSSIBLE PINNING;  Surgeon: Cindee SaltKuzma, Gary, MD;  Location: [facility];  Service: Orthopedics;  Laterality: Left;  AXILLARY BLOCK   RESECTION DISTAL CLAVICAL Right    STERIOD INJECTION Left 02/04/2020   Procedure: INJECTION LEFT CARPAL TUNNEL;  Surgeon: Cindee SaltKuzma, Gary, MD;  Location: [facility];  Service: Orthopedics;  Laterality: Left;   TUBAL LIGATION     UPPER GI ENDOSCOPY  04/2014   WISDOM TOOTH EXTRACTION     Social History:  reports that she quit smoking about 2 years ago. Her smoking use included cigarettes. She has never used smokeless tobacco. She reports that she does not drink alcohol and does not use drugs. Family History:  Family History  Problem Relation Age of Onset   Heart failure Father    Colon cancer Neg Hx      HOME MEDICATIONS: Allergies as of 07/03/2021       Reactions   Macrodantin [nitrofurantoin Macrocrystal] Rash   Metformin And Related Other (See Comments)   Causes yeast infections   Atorvastatin Diarrhea   Hydromorphone Hcl Nausea And Vomiting   Lipitor [atorvastatin Calcium] Diarrhea   Meperidine Hcl Nausea Only   Is ok if given with Phenergan   Tetracyclines & Related Rash        Medication List        Accurate as of July 03, 2021 11:46 AM. If you have any questions, ask your nurse or doctor.          Accu-Chek Aviva Plus test strip Generic drug: glucose blood USE AS INSTRUCTED TO TEST BLOOD SUGAR UP TO 3 TIMES DAILY   albuterol 108 (90 Base) MCG/ACT inhaler Commonly known as: VENTOLIN HFA Inhale 1-2 puffs into the lungs every 6 (six) hours as needed for wheezing or shortness of breath.   albuterol (2.5 MG/3ML) 0.083% nebulizer solution Commonly known as: PROVENTIL Take 2.5 mg by nebulization every 6  (six) hours as needed for wheezing or shortness of breath.   Alcohol  Pads 70 % Pads Use as directed   aspirin EC 81 MG tablet Take 1 tablet (81 mg total) by mouth daily.   BARIATRIC MULTIVITAMINS/IRON PO Take 1 tablet by mouth daily.   calcium carbonate 500 MG chewable tablet Commonly known as: TUMS - dosed in mg elemental calcium Chew 1 tablet by mouth 3 (three) times daily.   escitalopram 20 MG tablet Commonly known as: LEXAPRO Take 20 mg by mouth at bedtime.   fluconazole 150 MG tablet Commonly known as: DIFLUCAN Take 150 mg by mouth daily as needed for other (burning sensation). Burning sensation   fluticasone 50 MCG/ACT nasal spray Commonly known as: FLONASE Place 1 spray into both nostrils daily as needed for allergies.   hydrochlorothiazide 25 MG tablet Commonly known as: HYDRODIURIL Take 25 mg by mouth daily.   levothyroxine 88 MCG tablet Commonly known as: SYNTHROID Take 1 tablet (88 mcg total) by mouth daily. What changed: Another medication with the same name was removed. Continue taking this medication, and follow the directions you see here. Changed by: Scarlette ShortsIbtehal J Odie Edmonds, MD   lisinopril 10 MG tablet Commonly known as: ZESTRIL Take 10 mg by mouth daily.   LUBRICATING EYE DROPS OP Place 1 drop into both eyes daily as needed for dry eyes (dry eyes).   nitroGLYCERIN 0.4 MG SL tablet Commonly known as: NITROSTAT Place 1 tablet (0.4 mg total) under the tongue every 5 (five) minutes as needed for chest pain.   NovoLOG FlexPen 100 UNIT/ML FlexPen Generic drug: insulin aspart Inject 2-3 Units into the skin daily as needed for high blood sugar (over 175).   nystatin powder Commonly known as: MYCOSTATIN/NYSTOP Apply 1 application topically 2 (two) times daily as needed for dry skin (dry skin).   omeprazole 40 MG capsule Commonly known as: PRILOSEC Take 40 mg by mouth daily.   OVER THE COUNTER MEDICATION Take 300 mg by mouth at bedtime as needed for  sleep (sleep). CBD oil   oxyCODONE-acetaminophen 7.5-325 MG tablet Commonly known as: PERCOCET Take 1 tablet by mouth every 6 (six) hours as needed for severe pain.   pregabalin 150 MG capsule Commonly known as: LYRICA Take 150 mg by mouth 3 (three) times daily.   Repatha 140 MG/ML Sosy Generic drug: Evolocumab Inject 140 mg into the skin every 14 (fourteen) days.   terconazole 0.4 % vaginal cream Commonly known as: TERAZOL 7 Place 1 applicator vaginally at bedtime as needed for other (yeast infections). Yeast infection   topiramate 25 MG tablet Commonly known as: TOPAMAX Take 25 mg by mouth at bedtime.   Trelegy Ellipta 100-62.5-25 MCG/ACT Aepb Generic drug: Fluticasone-Umeclidin-Vilant Inhale 1 puff into the lungs daily.   Vitamin D (Ergocalciferol) 1.25 MG (50000 UNIT) Caps capsule Commonly known as: DRISDOL Take 50,000 Units by mouth every Saturday.         OBJECTIVE:   Vital Signs: BP 114/70 (BP Location: Left Arm, Patient Position: Sitting, Cuff Size: Small)    Pulse 64    Ht 5' 7.6" (1.717 m)    Wt 248 lb (112.5 kg)    LMP  (LMP Unknown)    SpO2 98%    BMI 38.16 kg/m   Wt Readings from Last 3 Encounters:  07/03/21 248 lb (112.5 kg)  03/23/21 248 lb 9.6 oz (112.8 kg)  02/16/21 247 lb (112 kg)     Exam: General: Pt appears well and is in NAD  Lungs: Clear with good BS bilat   Heart: RRR   Extremities:  No pretibial edema.   Neuro: MS is good with appropriate affect, pt is alert and Ox3      DATA REVIEWED:  Lab Results  Component Value Date   HGBA1C 5.9 (A) 07/03/2021   HGBA1C 6.0 (A) 12/30/2020   HGBA1C 5.4 08/26/2020     Latest Reference Range & Units 07/03/21 11:42  Sodium 135 - 145 mEq/L 140  Potassium 3.5 - 5.1 mEq/L 4.0  Chloride 96 - 112 mEq/L 102  CO2 19 - 32 mEq/L 31  Glucose 70 - 99 mg/dL 80  BUN 6 - 23 mg/dL 26 (H)  Creatinine 1.91 - 1.20 mg/dL 4.78  Calcium 8.4 - 29.5 mg/dL 9.8  GFR >62.13 mL/min 84.01    Latest Reference  Range & Units 07/03/21 11:42  VITD 30.00 - 100.00 ng/mL 67.08  Vitamin B12 211 - 911 pg/mL 308  WBC 4.0 - 10.5 K/uL 8.1  RBC 3.87 - 5.11 Mil/uL 4.43  Hemoglobin 12.0 - 15.0 g/dL 08.6  HCT 57.8 - 46.9 % 40.9  MCV 78.0 - 100.0 fl 92.4  MCHC 30.0 - 36.0 g/dL 62.9  RDW 52.8 - 41.3 % 14.6  Platelets 150.0 - 400.0 K/uL 217.0    Latest Reference Range & Units 07/03/21 11:42  TSH 0.35 - 5.50 uIU/mL 0.47  T4,Free(Direct) 0.60 - 1.60 ng/dL 2.44     ASSESSMENT / PLAN / RECOMMENDATIONS:   1) Postablative Hypothyroidism :  - Pt with fatigue - No local neck symptoms -Her TSH remains normal  Medication : Continue levothyroxine 88 MCG daily     2) Type 2 Diabetes Mellitus, in remission, A1c 5.9%   - S/P sleeve gastrectomy on 10/03/2020 .  - Has a history of pancreatitis , DPP-4 inhibitors and GLP-1 agonists are CONTRAINDICATED - She is intolerant to metformin and SGLT-2 inhibitors.  -Has been off all glycemic agents since 09/2020    3) Fatigue:  -Patient with complaints of fatigue.  Lab work today does not show any evidence of anemia nor vitamin deficiency. -TSH normal -She has OSA, and has pending reevaluation for CPAP   F/U in 6 months     Signed electronically by: Lyndle Herrlich, MD  [facility]  [facility] [address] [place] [place], [place] [address] Phone: [phone] FAX: [phone]   CC: Hanley Ben [address] [place] [address] Phone: [phone]  Fax: [phone]  Return to Endocrinology clinic as below: Future Appointments  Date Time Provider Department Center  07/11/2021 10:15 AM Asencion Islam, DPM [facility]  08/28/2021 11:00 AM [facility] None  01/03/2022 11:10 AM Nikiah Goin, Konrad Dolores, MD [facility] None

## 2021-07-04 ENCOUNTER — Ambulatory Visit: Payer: Medicare HMO | Admitting: Sports Medicine

## 2021-07-11 ENCOUNTER — Encounter: Payer: Self-pay | Admitting: Sports Medicine

## 2021-07-11 ENCOUNTER — Ambulatory Visit (INDEPENDENT_AMBULATORY_CARE_PROVIDER_SITE_OTHER): Payer: Medicare HMO

## 2021-07-11 ENCOUNTER — Ambulatory Visit (INDEPENDENT_AMBULATORY_CARE_PROVIDER_SITE_OTHER): Payer: Medicare HMO | Admitting: Sports Medicine

## 2021-07-11 DIAGNOSIS — M2041 Other hammer toe(s) (acquired), right foot: Secondary | ICD-10-CM

## 2021-07-11 DIAGNOSIS — M79671 Pain in right foot: Secondary | ICD-10-CM

## 2021-07-11 DIAGNOSIS — M79674 Pain in right toe(s): Secondary | ICD-10-CM

## 2021-07-11 DIAGNOSIS — M2061 Acquired deformities of toe(s), unspecified, right foot: Secondary | ICD-10-CM | POA: Diagnosis not present

## 2021-07-11 DIAGNOSIS — B351 Tinea unguium: Secondary | ICD-10-CM

## 2021-07-11 DIAGNOSIS — E0800 Diabetes mellitus due to underlying condition with hyperosmolarity without nonketotic hyperglycemic-hyperosmolar coma (NKHHC): Secondary | ICD-10-CM

## 2021-07-11 DIAGNOSIS — M21619 Bunion of unspecified foot: Secondary | ICD-10-CM | POA: Diagnosis not present

## 2021-07-11 DIAGNOSIS — M21621 Bunionette of right foot: Secondary | ICD-10-CM

## 2021-07-11 NOTE — Progress Notes (Signed)
Subjective: Marie Chen is a 61 y.o. female patient with history of diabetes who presents to office today complaining of long,mildly painful nails and callus unable to trim.  Patient reports that her bunions still bother her especially the right one and has been using foam cushions as I provided last visit still without any improvement and has also tried cushions on her arms wider shoes change of activity has significant pain to the right foot with deformity.  Fasting blood sugar this morning 89 last A1c 5.8 and last visit to PCP at [facility] clinic 2 months ago.  Patient states that she wants to consider surgery options for her bunions.  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   Diabetes mellitus ([place]) 10/07/2020   Pre-operative cardiovascular examination 07/25/2020   Osteoarthritis 07/22/2020   Inflammatory arthritis 07/22/2020   Asthma    Coronary artery disease    Diabetes mellitus without complication ([place])    Dyspnea    Fatty liver    Fibromyalgia    Sjogren's disease ([place])    Sleep apnea    Thyroid disease    Pain in left shoulder 07/13/2020   Type 2 diabetes mellitus with diabetic polyneuropathy, with long-term current use of insulin ([place]) 01/26/2020   Preop cardiovascular exam 01/22/2020   Community acquired pneumonia 01/15/2020   Carpal tunnel syndrome of left wrist 12/30/2019   Trigger finger, left little finger 12/30/2019   Numbness 12/04/2019   Old disruption of left lateral collateral ligament 12/04/2019   Type 2 diabetes mellitus with hyperglycemia, without long-term current use of insulin ([place]) 10/19/2019   Dyslipidemia 10/19/2019   Morbid obesity with BMI of 50.0-59.9, adult ([place]) 09/23/2019   Chronic obstructive pulmonary disease ([place]) 06/19/2019   Hypertension    GERD (gastroesophageal reflux disease)    Acute hypoxemic  respiratory failure due to severe acute respiratory syndrome coronavirus 2 (SARS-CoV-2) disease ([place]) 06/18/2019   CAD (coronary artery disease) 01/01/2019   Morbid obesity ([place]) 12/04/2018   Angina pectoris ([place]) 12/04/2018   Diabetes mellitus type 2 in obese ([place]) 05/12/2018   Cough with hemoptysis 05/11/2018   Hemoptysis 05/11/2018   Trochanteric bursitis of left hip 03/02/2018   Pre-ulcerative corn or callous 01/30/2018   Thoracic spine pain 11/13/2017   Lumbar radiculopathy 09/19/2017   Pain from implanted hardware 08/01/2017   Plantar callus 08/01/2017   Plantar fasciitis 08/01/2017   Chronic pain syndrome 04/25/2017   Lumbar facet arthropathy 04/25/2017   Primary osteoarthritis of both hips 04/25/2017   Primary osteoarthritis of both knees 04/25/2017   Segmental dysfunction of lumbar region 04/25/2017   ACUTE PHARYNGITIS 05/23/2009   SINUSITIS- ACUTE-NOS 02/24/2009   HEADACHE 02/24/2009   Headache 02/24/2009   ASTHMATIC BRONCHITIS, ACUTE 04/27/2008   DYSPHAGIA 04/27/2008   MIGRAINE, CHRONIC 04/22/2008   Bronchitis, chronic ([place]) 04/22/2008   PERSONAL HISTORY ENDOCRN METABOLIC&IMMUNITY D/O 0000000   Migraine headache 04/22/2008   Diabetes mellitus due to underlying condition with unspecified complications ([place]) [id number]   DSORD CIRCADIAN RHY SHFT [place] SLEEP PHASE TYPE 03/26/2007   Allergic rhinitis 03/26/2007   ASTHMA NOS W/ACUTE EXACERBATION 03/26/2007   PEPTIC ULCER DISEASE 03/26/2007   RESTLESS LEG SYNDROME, HX OF 03/26/2007   IRRITABLE BOWEL SYNDROME, HX OF 03/26/2007   Asthma with exacerbation 03/26/2007   Hypothyroidism 01/05/2007   Mixed dyslipidemia 01/05/2007   DEPRESSION 01/05/2007   Sleep apnea, obstructive 01/05/2007  Essential hypertension 01/05/2007   GERD 01/05/2007   HAMMER TOE 01/05/2007   Depression 01/05/2007   Hyperlipidemia 01/05/2007   Current Outpatient Medications on File Prior to Visit  Medication Sig Dispense Refill   ACCU-CHEK  AVIVA PLUS test strip USE AS INSTRUCTED TO TEST BLOOD SUGAR UP TO 3 TIMES DAILY 300 strip 3   albuterol (PROVENTIL) (2.5 MG/3ML) 0.083% nebulizer solution Take 2.5 mg by nebulization every 6 (six) hours as needed for wheezing or shortness of breath.      albuterol (VENTOLIN HFA) 108 (90 Base) MCG/ACT inhaler Inhale 1-2 puffs into the lungs every 6 (six) hours as needed for wheezing or shortness of breath.     Alcohol Swabs (ALCOHOL PADS) 70 % PADS Use as directed 1 each 3   aspirin EC 81 MG tablet Take 1 tablet (81 mg total) by mouth daily. 90 tablet 3   calcium carbonate (TUMS - DOSED IN MG ELEMENTAL CALCIUM) 500 MG chewable tablet Chew 1 tablet by mouth 3 (three) times daily.     Carboxymethylcellul-Glycerin (LUBRICATING EYE DROPS OP) Place 1 drop into both eyes daily as needed for dry eyes (dry eyes).     escitalopram (LEXAPRO) 20 MG tablet Take 20 mg by mouth at bedtime.      Evolocumab (REPATHA) 140 MG/ML SOSY Inject 140 mg into the skin every 14 (fourteen) days. 2.1 mL 11   fluconazole (DIFLUCAN) 150 MG tablet Take 150 mg by mouth daily as needed for other (burning sensation). Burning sensation     fluticasone (FLONASE) 50 MCG/ACT nasal spray Place 1 spray into both nostrils daily as needed for allergies.      Fluticasone-Umeclidin-Vilant (TRELEGY ELLIPTA) 100-62.5-25 MCG/INH AEPB Inhale 1 puff into the lungs daily.      hydrochlorothiazide (HYDRODIURIL) 25 MG tablet Take 25 mg by mouth daily.     insulin aspart (NOVOLOG FLEXPEN) 100 UNIT/ML FlexPen Inject 2-3 Units into the skin daily as needed for high blood sugar (over 175).     levothyroxine (SYNTHROID) 88 MCG tablet Take 1 tablet (88 mcg total) by mouth daily. 90 tablet 3   lisinopril (ZESTRIL) 10 MG tablet Take 10 mg by mouth daily.     Multiple Vitamins-Minerals (BARIATRIC MULTIVITAMINS/IRON PO) Take 1 tablet by mouth daily.     nitroGLYCERIN (NITROSTAT) 0.4 MG SL tablet Place 1 tablet (0.4 mg total) under the tongue every 5 (five)  minutes as needed for chest pain. 25 tablet 6   nystatin (MYCOSTATIN/NYSTOP) powder Apply 1 application topically 2 (two) times daily as needed for dry skin (dry skin).     omeprazole (PRILOSEC) 40 MG capsule Take 40 mg by mouth daily.     OVER THE COUNTER MEDICATION Take 300 mg by mouth at bedtime as needed for sleep (sleep). CBD oil      oxyCODONE-acetaminophen (PERCOCET) 7.5-325 MG tablet Take 1 tablet by mouth every 6 (six) hours as needed for severe pain.     pregabalin (LYRICA) 150 MG capsule Take 150 mg by mouth 3 (three) times daily.     terconazole (TERAZOL 7) 0.4 % vaginal cream Place 1 applicator vaginally at bedtime as needed for other (yeast infections). Yeast infection      topiramate (TOPAMAX) 25 MG tablet Take 25 mg by mouth at bedtime.     Vitamin D, Ergocalciferol, (DRISDOL) 1.25 MG (50000 UT) CAPS capsule Take 50,000 Units by mouth every Saturday.     No current facility-administered medications on file prior to visit.   Allergies  Allergen  Reactions   Macrodantin [Nitrofurantoin Macrocrystal] Rash   Metformin And Related Other (See Comments)    Causes yeast infections   Atorvastatin Diarrhea   Hydromorphone Hcl Nausea And Vomiting   Lipitor [Atorvastatin Calcium] Diarrhea   Meperidine Hcl Nausea Only    Is ok if given with Phenergan   Tetracyclines & Related Rash    Recent Results (from the past 2160 hour(s))  POCT glycosylated hemoglobin (Hb A1C)     Status: Abnormal   Collection Time: 07/03/21 11:22 AM  Result Value Ref Range   Hemoglobin A1C 5.9 (A) 4.0 - 5.6 %   HbA1c POC (<> result, manual entry)     HbA1c, POC (prediabetic range)     HbA1c, POC (controlled diabetic range)    B12     Status: None   Collection Time: 07/03/21 11:42 AM  Result Value Ref Range   Vitamin B-12 308 211 - 911 pg/mL  VITAMIN D 25 Hydroxy (Vit-D Deficiency, Fractures)     Status: None   Collection Time: 07/03/21 11:42 AM  Result Value Ref Range   VITD 67.08 30.00 - 100.00 ng/mL   Basic metabolic panel     Status: Abnormal   Collection Time: 07/03/21 11:42 AM  Result Value Ref Range   Sodium 140 135 - 145 mEq/L   Potassium 4.0 3.5 - 5.1 mEq/L   Chloride 102 96 - 112 mEq/L   CO2 31 19 - 32 mEq/L   Glucose, Bld 80 70 - 99 mg/dL   BUN 26 (H) 6 - 23 mg/dL   Creatinine, Ser 0.76 0.40 - 1.20 mg/dL   GFR 84.01 >60.00 mL/min    Comment: Calculated using the CKD-EPI Creatinine Equation (2021)   Calcium 9.8 8.4 - 10.5 mg/dL  CBC     Status: None   Collection Time: 07/03/21 11:42 AM  Result Value Ref Range   WBC 8.1 4.0 - 10.5 K/uL   RBC 4.43 3.87 - 5.11 Mil/uL   Platelets 217.0 150.0 - 400.0 K/uL   Hemoglobin 13.4 12.0 - 15.0 g/dL   HCT 40.9 36.0 - 46.0 %   MCV 92.4 78.0 - 100.0 fl   MCHC 32.8 30.0 - 36.0 g/dL   RDW 14.6 11.5 - 15.5 %  T4, free     Status: None   Collection Time: 07/03/21 11:42 AM  Result Value Ref Range   Free T4 0.79 0.60 - 1.60 ng/dL    Comment: Specimens from patients who are undergoing biotin therapy and /or ingesting biotin supplements may contain high levels of biotin.  The higher biotin concentration in these specimens interferes with this Free T4 assay.  Specimens that contain high levels  of biotin may cause false high results for this Free T4 assay.  Please interpret results in light of the total clinical presentation of the patient.    TSH     Status: None   Collection Time: 07/03/21 11:42 AM  Result Value Ref Range   TSH 0.47 0.35 - 5.50 uIU/mL    Objective: General: Patient is awake, alert, and oriented x 3 and in no acute distress.  Integument: Skin is warm, dry and supple bilateral. Nails are tender, long, thickened and dystrophic with subungual debris, consistent with onychomycosis, 1-5 bilateral.  Callus to first toes present bilateral. Remaining integument unremarkable.  Neurovascular status intact bilateral, Semmes-Weinstein monofilament present bilateral  Musculoskeletal: Unchanged bunion deformity right more  symptomatic than left.  Lesser hammertoe deformity and tailor's bunion noted on the right.  Pes planus  foot type.  Hallux valgus/Bunion Biomechanical Exam   The right hallux is abutting the 2nd toe with dorsal medial prominent 1st metatarsal head.  IPJ is abducted with limited range of motion.  The 1st MTPJ is abducted with tracking range of motion measuring 25 dorsiflexion and 10 plantarflexion. The first ray rests neutral to the 2nd ray; ROM measures 10 dorsiflexion and 10 plantarflexion.  X-ray right foot shows enlarged intermetatarsal angle with hallux deviation and significant lesser digital contracture deformities as well as mildly prominent fifth metatarsal head consistent with tailor's bunion deformity there is normal osseous mineralization and soft tissues within normal limits no foreign body no significant fracture or dislocation.  There is midtarsal breech supportive of pes planus deformity.  No other acute findings noted.  Assessment and Plan: Problem List Items Addressed This Visit       Endocrine   Diabetes mellitus ([place])   Other Visit Diagnoses     Acquired deformity of right toe    -  Primary   Relevant Orders   DG Foot Complete Right   Bunion       Acquired hammertoe of right foot       Tailor's bunionette, right       Right foot pain       Pain due to onychomycosis of toenails of both feet           -Examined patient. -Discussed and educated patient on diabetic foot care, especially with  regards to the vascular, neurological and musculoskeletal systems.  -Stressed the importance of good glycemic control and the detriment of not  controlling glucose levels in relation to the foot. -Mechanically debrided all nails 1-5 bilateral using sterile nail nipper and filed with dremel without incident  -Mechanically debrided callus x2 using a sterile 15 blade without incident -Discussed with patient treatment options for bunion pain and pain at the toes on the right foot.   Discussed surgical versus conservative options patient elects for surgical treatment at this time.  Consent obtained for right first metatarsal osteotomy for bunionectomy, first toe osteotomy, hammertoe repair 2 through 5 with internal versus external fixation, tailor's bunionectomy all on the right.  No guarantees given or implied all questions answered explained to patient in great detail extensive recovery and advised her that it may take 3 to 4 months to completely recover and must remain nonweightbearing for the initial 4 weeks; knee scooter ordered -We will plan to do surgery at [facility]; scheduling information provided -Patient to return after surgery or sooner if problems or issues arise -Patient advised to call the office if any problems or questions arise in the meantime.  Landis Martins, DPM

## 2021-07-30 ENCOUNTER — Other Ambulatory Visit: Payer: Self-pay | Admitting: Sports Medicine

## 2021-07-30 MED ORDER — PROMETHAZINE HCL 25 MG PO TABS: 25.0000 mg | ORAL_TABLET | Freq: Three times a day (TID) | ORAL | 0 refills | Status: DC | PRN

## 2021-07-30 MED ORDER — IBUPROFEN 800 MG PO TABS: 800.0000 mg | ORAL_TABLET | Freq: Three times a day (TID) | ORAL | 0 refills | Status: DC | PRN

## 2021-07-30 MED ORDER — HYDROCODONE-ACETAMINOPHEN 10-325 MG PO TABS: 1.0000 | ORAL_TABLET | Freq: Four times a day (QID) | ORAL | 0 refills | Status: AC | PRN

## 2021-07-30 MED ORDER — DOCUSATE SODIUM 100 MG PO CAPS: 100.0000 mg | ORAL_CAPSULE | Freq: Two times a day (BID) | ORAL | 0 refills | Status: AC

## 2021-07-30 NOTE — Progress Notes (Signed)
Post op meds entered °Surgery 07-31-21 °

## 2021-07-31 ENCOUNTER — Encounter: Payer: Self-pay | Admitting: Sports Medicine

## 2021-07-31 DIAGNOSIS — M2011 Hallux valgus (acquired), right foot: Secondary | ICD-10-CM | POA: Diagnosis not present

## 2021-07-31 DIAGNOSIS — M2041 Other hammer toe(s) (acquired), right foot: Secondary | ICD-10-CM | POA: Diagnosis not present

## 2021-08-01 ENCOUNTER — Telehealth: Payer: Self-pay | Admitting: Urology

## 2021-08-01 ENCOUNTER — Telehealth: Payer: Self-pay | Admitting: Sports Medicine

## 2021-08-01 NOTE — Telephone Encounter (Signed)
DOS - 07/31/21  Barbie [facility] OSTEOTOMY RIGHT --- [id number] [place] BUNION ECTOMY RIGHT --- ID:[id number] [facility] 5TH RIGHT --- [phone] HAMMERTOE REPAIR 2-5 RIGHT --- [phone]   [facility] EFFECTIVE DATE - 06/11/17  PER [place] [employer] FOR CPT CODES 29562, ID:[id number], 28110 AND 13086 HAVE BEEN APPROVED, AUTH # [id number], GOOD FROM 07/31/21 - 5/210/23.  TRACKING # [id number] TRACKING # [id number]

## 2021-08-01 NOTE — Telephone Encounter (Signed)
Post op check phone call made to patient.  Patient reports that she is doing good does not have any pain.  Patient has been staying up on taking her pain medicine as I have directed and states that she forgot to ice them yesterday but will do it today.  I reminded patient to continue with rest ice elevation and taking medications as instructed.  Patient asked questions on how the surgery went and I advised her that the surgery went well that she has external K wires at the second third and fourth toes.  I advised patient that on next week when she comes the office we will take x-rays and do a bandage change and can further discuss how her surgery went.  Patient expressed understanding and thanked me for calling and thanked me for meeting her daughter on yesterday. -Dr. [name]

## 2021-08-03 ENCOUNTER — Other Ambulatory Visit: Payer: Self-pay | Admitting: Sports Medicine

## 2021-08-03 DIAGNOSIS — Z9889 Other specified postprocedural states: Secondary | ICD-10-CM

## 2021-08-03 MED ORDER — OXYCODONE-ACETAMINOPHEN 10-325 MG PO TABS: 1.0000 | ORAL_TABLET | Freq: Four times a day (QID) | ORAL | 0 refills | Status: DC | PRN

## 2021-08-03 NOTE — Progress Notes (Signed)
Changed Norco to Percocet to see if this helps any of her pain

## 2021-08-04 ENCOUNTER — Other Ambulatory Visit: Payer: Self-pay | Admitting: Sports Medicine

## 2021-08-04 ENCOUNTER — Telehealth: Payer: Self-pay | Admitting: *Deleted

## 2021-08-04 DIAGNOSIS — Z9889 Other specified postprocedural states: Secondary | ICD-10-CM

## 2021-08-04 MED ORDER — GABAPENTIN 300 MG PO CAPS: 300.0000 mg | ORAL_CAPSULE | Freq: Two times a day (BID) | ORAL | 0 refills | Status: DC

## 2021-08-04 MED ORDER — OXYCODONE-ACETAMINOPHEN 10-325 MG PO TABS: 1.0000 | ORAL_TABLET | Freq: Four times a day (QID) | ORAL | 0 refills | Status: AC | PRN

## 2021-08-04 NOTE — Progress Notes (Signed)
Post op meds entered and changed percocet to have for break through pan not relieved with chronic meds and also added on Gabapentin for pain not relieved by Salli Real

## 2021-08-04 NOTE — Telephone Encounter (Signed)
Tried to call the patient this morning and there was no answer. Marie Chen

## 2021-08-07 ENCOUNTER — Other Ambulatory Visit: Payer: Self-pay | Admitting: Internal Medicine

## 2021-08-08 ENCOUNTER — Ambulatory Visit (INDEPENDENT_AMBULATORY_CARE_PROVIDER_SITE_OTHER): Payer: Medicare HMO | Admitting: Sports Medicine

## 2021-08-08 ENCOUNTER — Encounter: Payer: Self-pay | Admitting: Sports Medicine

## 2021-08-08 ENCOUNTER — Telehealth: Payer: Self-pay | Admitting: *Deleted

## 2021-08-08 ENCOUNTER — Ambulatory Visit (INDEPENDENT_AMBULATORY_CARE_PROVIDER_SITE_OTHER): Payer: Medicare HMO

## 2021-08-08 ENCOUNTER — Other Ambulatory Visit: Payer: Self-pay

## 2021-08-08 VITALS — Temp 97.0°F

## 2021-08-08 DIAGNOSIS — M2041 Other hammer toe(s) (acquired), right foot: Secondary | ICD-10-CM

## 2021-08-08 DIAGNOSIS — M2061 Acquired deformities of toe(s), unspecified, right foot: Secondary | ICD-10-CM

## 2021-08-08 DIAGNOSIS — M21619 Bunion of unspecified foot: Secondary | ICD-10-CM

## 2021-08-08 DIAGNOSIS — M79671 Pain in right foot: Secondary | ICD-10-CM

## 2021-08-08 DIAGNOSIS — M21621 Bunionette of right foot: Secondary | ICD-10-CM

## 2021-08-08 DIAGNOSIS — Z9889 Other specified postprocedural states: Secondary | ICD-10-CM

## 2021-08-08 NOTE — Progress Notes (Signed)
Subjective: Marie Chen is a 61 y.o. female patient seen today in office for POV #1 (DOS 07-31-21), S/P Right bunionectomy and hammertoe repair and tailor's bunionectomy with internal and external fixation.  Patient states that she is doing better less pain also admits that she took the bandage off because it was too bloody even though she was given clear instructions to leave things in place patient denies nausea vomiting fever chills or any constitutional symptoms at this time.  Patient is assisted by husband this visit who reports that she is hardheaded and does not stay off of it.   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   Diabetes mellitus ([facility]) 10/07/2020   Pre-operative cardiovascular examination 07/25/2020   Osteoarthritis 07/22/2020   Inflammatory arthritis 07/22/2020   Asthma    Coronary artery disease    Diabetes mellitus without complication ([facility])    Dyspnea    Fatty liver    Fibromyalgia    Sjogren's disease ([facility])    Sleep apnea    Thyroid disease    Pain in left shoulder 07/13/2020   Type 2 diabetes mellitus with diabetic polyneuropathy, with long-term current use of insulin ([facility]) 01/26/2020   Preop cardiovascular exam 01/22/2020   Community acquired pneumonia 01/15/2020   Carpal tunnel syndrome of left wrist 12/30/2019   Trigger finger, left little finger 12/30/2019   Numbness 12/04/2019   Old disruption of left lateral collateral ligament 12/04/2019   Type 2 diabetes mellitus with hyperglycemia, without long-term current use of insulin (HCC) 10/19/2019   Dyslipidemia 10/19/2019   Morbid obesity with BMI of 50.0-59.9, adult (HCC) 09/23/2019   Chronic obstructive pulmonary disease (H[facility]) 06/19/2019   Hypertension    GERD (gastroesophageal reflux disease)    Acute hypoxemic respiratory failure due to severe acute respiratory syndrome  coronavirus 2 (SARS-CoV-2) disease (HCC) 06/18/2019   CAD (coronary artery disease) 01/01/2019   Morbid obesity (HCC) 12/04/2018   Angina pectoris (HCC) 12/04/2018   Diabetes mellitus type 2 in obese (HCC) 05/12/2018   Cough with hemoptysis 05/11/2018   Hemoptysis 05/11/2018   Trochanteric bursitis of left hip 03/02/2018   Pre-ulcerative corn or callous 01/30/2018   Thoracic spine pain 11/13/2017   Lumbar radiculopathy 09/19/2017   Pain from implanted hardware 08/01/2017   Plantar callus 08/01/2017   Plantar fasciitis 08/01/2017   Chronic pain syndrome 04/25/2017   Lumbar facet arthropathy 04/25/2017   Primary osteoarthritis of both hips 04/25/2017   Primary osteoarthritis of both knees 04/25/2017   Segmental dysfunction of lumbar region 04/25/2017   ACUTE PHARYNGITIS 05/23/2009   SINUSITIS- ACUTE-NOS 02/24/2009   HEADACHE 02/24/2009   Headache 02/24/2009   ASTHMATIC BRONCHITIS, ACUTE 04/27/2008   DYSPHAGIA 04/27/2008   MIGRAINE, CHRONIC 04/22/2008   Bronchitis, chronic (HCC) 04/22/2008   PERSONAL HISTORY ENDOCRN METABOLIC&IMMUNITY D/O 04/22/2008   Migraine headache 04/22/2008   Diabetes mellitus due to underlying condition with unspecified complications (HCC) 03/26/2007   DSORD CIRCADIAN RHY SHFT [place] SLEEP PHASE TYPE 03/26/2007   Allergic rhinitis 03/26/2007   ASTHMA NOS W/ACUTE EXACERBATION 03/26/2007   PEPTIC ULCER DISEASE 03/26/2007   RESTLESS LEG SYNDROME, HX OF 03/26/2007   IRRITABLE BOWEL SYNDROME, HX OF 03/26/2007   Asthma with exacerbation 03/26/2007   Hypothyroidism 01/05/2007   Mixed dyslipidemia 01/05/2007   DEPRESSION 01/05/2007   Sleep apnea, obstructive 01/05/2007   Essential hypertension 01/05/2007   GERD 01/05/2007  HAMMER TOE 01/05/2007   Depression 01/05/2007   Hyperlipidemia 01/05/2007    Current Outpatient Medications on File Prior to Visit  Medication Sig Dispense Refill   ACCU-CHEK AVIVA PLUS test strip USE AS INSTRUCTED TO TEST BLOOD SUGAR UP  TO 3 TIMES DAILY 300 strip 3   albuterol (PROVENTIL) (2.5 MG/3ML) 0.083% nebulizer solution Take 2.5 mg by nebulization every 6 (six) hours as needed for wheezing or shortness of breath.      albuterol (VENTOLIN HFA) 108 (90 Base) MCG/ACT inhaler Inhale 1-2 puffs into the lungs every 6 (six) hours as needed for wheezing or shortness of breath.     Alcohol Swabs (DROPSAFE ALCOHOL PREP) 70 % PADS USE AS DIRECTED 100 each 0   aspirin EC 81 MG tablet Take 1 tablet (81 mg total) by mouth daily. 90 tablet 3   calcium carbonate (TUMS - DOSED IN MG ELEMENTAL CALCIUM) 500 MG chewable tablet Chew 1 tablet by mouth 3 (three) times daily.     Carboxymethylcellul-Glycerin (LUBRICATING EYE DROPS OP) Place 1 drop into both eyes daily as needed for dry eyes (dry eyes).     docusate sodium (COLACE) 100 MG capsule Take 1 capsule (100 mg total) by mouth 2 (two) times daily. 10 capsule 0   escitalopram (LEXAPRO) 20 MG tablet Take 20 mg by mouth at bedtime.      Evolocumab (REPATHA) 140 MG/ML SOSY Inject 140 mg into the skin every 14 (fourteen) days. 2.1 mL 11   fluconazole (DIFLUCAN) 150 MG tablet Take 150 mg by mouth daily as needed for other (burning sensation). Burning sensation     fluticasone (FLONASE) 50 MCG/ACT nasal spray Place 1 spray into both nostrils daily as needed for allergies.      Fluticasone-Umeclidin-Vilant (TRELEGY ELLIPTA) 100-62.5-25 MCG/INH AEPB Inhale 1 puff into the lungs daily.      gabapentin (NEURONTIN) 300 MG capsule Take 1 capsule (300 mg total) by mouth 2 (two) times daily. 90 capsule 0   hydrochlorothiazide (HYDRODIURIL) 25 MG tablet Take 25 mg by mouth daily.     ibuprofen (ADVIL) 800 MG tablet Take 1 tablet (800 mg total) by mouth every 8 (eight) hours as needed. 30 tablet 0   insulin aspart (NOVOLOG FLEXPEN) 100 UNIT/ML FlexPen Inject 2-3 Units into the skin daily as needed for high blood sugar (over 175).     levothyroxine (SYNTHROID) 88 MCG tablet Take 1 tablet (88 mcg total) by  mouth daily. 90 tablet 3   lisinopril (ZESTRIL) 10 MG tablet Take 10 mg by mouth daily.     Multiple Vitamins-Minerals (BARIATRIC MULTIVITAMINS/IRON PO) Take 1 tablet by mouth daily.     nitroGLYCERIN (NITROSTAT) 0.4 MG SL tablet Place 1 tablet (0.4 mg total) under the tongue every 5 (five) minutes as needed for chest pain. 25 tablet 6   nystatin (MYCOSTATIN/NYSTOP) powder Apply 1 application topically 2 (two) times daily as needed for dry skin (dry skin).     omeprazole (PRILOSEC) 40 MG capsule Take 40 mg by mouth daily.     OVER THE COUNTER MEDICATION Take 300 mg by mouth at bedtime as needed for sleep (sleep). CBD oil      oxyCODONE-acetaminophen (PERCOCET) 10-325 MG tablet Take 1 tablet by mouth every 6 (six) hours as needed for up to 5 days for pain. 20 tablet 0   pregabalin (LYRICA) 150 MG capsule Take 150 mg by mouth 3 (three) times daily.     promethazine (PHENERGAN) 25 MG tablet Take 1 tablet (25 mg  total) by mouth every 8 (eight) hours as needed for nausea or vomiting. 20 tablet 0   terconazole (TERAZOL 7) 0.4 % vaginal cream Place 1 applicator vaginally at bedtime as needed for other (yeast infections). Yeast infection      topiramate (TOPAMAX) 25 MG tablet Take 25 mg by mouth at bedtime.     Vitamin D, Ergocalciferol, (DRISDOL) 1.25 MG (50000 UT) CAPS capsule Take 50,000 Units by mouth every Saturday.     No current facility-administered medications on file prior to visit.    Allergies  Allergen Reactions   Macrodantin [Nitrofurantoin Macrocrystal] Rash   Metformin And Related Other (See Comments)    Causes yeast infections   Atorvastatin Diarrhea   Hydromorphone Hcl Nausea And Vomiting   Lipitor [Atorvastatin Calcium] Diarrhea   Meperidine Hcl Nausea Only    Is ok if given with Phenergan   Tetracyclines & Related Rash    Objective: There were no vitals filed for this visit.  General: No acute distress, AAOx3  Right foot: K wires and sutures intact with no gapping or  dehiscence at surgical sites, moderate swelling to the left foot and ecchymosis is likely due to dependency and patient being up on her foot excessively, blanchable erythema likely consistent with swelling, no warmth, no drainage, no signs of infection noted, Capillary fill time <3 seconds in all digits, gross sensation present via light touch to right foot with mild guarding due to pain.  No pain with light touch to the lower leg.  X-rays Right foot: Hardware intact, soft tissue swelling within normal limits for post op status.   Assessment and Plan:  Problem List Items Addressed This Visit   None Visit Diagnoses     Acquired deformity of right toe    -  Primary   Relevant Orders   DG Foot Complete Right   Bunion       Acquired hammertoe of right foot       Tailor's bunionette, right       Right foot pain       Post-operative state            -Patient seen and evaluated -X-rays reviewed -Applied dry sterile dressing to surgical site right/foot secured with Ace and stockinette and advised patient strongly to keep dressing in place and do not remove reiterated this again to patient to keep dressing intact because the more she keeps opening up can increase her chances for infection or surgical complications -Advised patient to continue with use of wheelchair or knee scooter to stay off the foot and she must refrain nonweightbearing for the next 3 weeks until we can remove the K wires -Advised patient to continue with pain medicine as previously prescribed -Advised patient to continue with rest and elevation to assist with pain and edema control and to avoid complication or hitting pins -Advised patient to be compliant with postoperative instructions to avoid problems or complications as above -Will plan for possible suture removal if swelling is better at next office visit. In the meantime, patient to call office if any issues or problems arise.   Asencion Islam, DPM

## 2021-08-08 NOTE — Telephone Encounter (Signed)
Dr [name] spoke with patient on Friday 08-04-2021. Marie Chen

## 2021-08-08 NOTE — Telephone Encounter (Signed)
-----   Message from [place], [place] sent at 08/03/2021  5:19 PM EST ----- Her block has probably worn off so sometimes the pain will increase.  On tomorrow patient can pick up oxycodone to take if the hydrocodone seems to not be helping.  Patient should also continue with rest elevation and icing 20 minutes out of each hour to help with the pain.  Patient can also check the dressing and lightly loosen the Ace wrap to make sure her dressing has not gotten too tight that could be causing pain as well. ----- Message ----- From: Lanney Gins, PMAC Sent: 08/03/2021   4:17 PM EST To: Asencion Islam, DPM  Patient had surgery on Monday and is having uncontrollable pain and is taken the pain medicine with muscle relaxer and tylenol as well. Please advise Misty Stanley

## 2021-08-15 ENCOUNTER — Ambulatory Visit (INDEPENDENT_AMBULATORY_CARE_PROVIDER_SITE_OTHER): Payer: Medicare HMO | Admitting: Sports Medicine

## 2021-08-15 ENCOUNTER — Encounter: Payer: Self-pay | Admitting: Sports Medicine

## 2021-08-15 ENCOUNTER — Other Ambulatory Visit: Payer: Self-pay

## 2021-08-15 DIAGNOSIS — M2041 Other hammer toe(s) (acquired), right foot: Secondary | ICD-10-CM

## 2021-08-15 DIAGNOSIS — M21621 Bunionette of right foot: Secondary | ICD-10-CM

## 2021-08-15 DIAGNOSIS — M2061 Acquired deformities of toe(s), unspecified, right foot: Secondary | ICD-10-CM

## 2021-08-15 DIAGNOSIS — M79671 Pain in right foot: Secondary | ICD-10-CM

## 2021-08-15 DIAGNOSIS — Z9889 Other specified postprocedural states: Secondary | ICD-10-CM

## 2021-08-15 DIAGNOSIS — M21619 Bunion of unspecified foot: Secondary | ICD-10-CM

## 2021-08-15 MED ORDER — HYDROCODONE-ACETAMINOPHEN 10-325 MG PO TABS: 1.0000 | ORAL_TABLET | Freq: Four times a day (QID) | ORAL | 0 refills | Status: DC | PRN

## 2021-08-15 NOTE — Progress Notes (Signed)
Subjective: Marie Chen is a 61 y.o. female patient seen today in office for POV #2 (DOS 07-31-21), S/P Right bunionectomy and hammertoe repair and tailor's bunionectomy with internal and external fixation.  Patient states that she is doing better and that she did not take the dressing off since last visit but did admit that she got it wet this morning when she was taking a shower.  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   Diabetes mellitus (HCC) 10/07/2020   Pre-operative cardiovascular examination 07/25/2020   Osteoarthritis 07/22/2020   Inflammatory arthritis 07/22/2020   Asthma    Coronary artery disease    Diabetes mellitus without complication (HCC)    Dyspnea    Fatty liver    Fibromyalgia    Sjogren's disease (HCC)    Sleep apnea    Thyroid disease    Pain in left shoulder 07/13/2020   Type 2 diabetes mellitus with diabetic polyneuropathy, with long-term current use of insulin (HCC) 01/26/2020   Preop cardiovascular exam 01/22/2020   Community acquired pneumonia 01/15/2020   Carpal tunnel syndrome of left wrist 12/30/2019   Trigger finger, left little finger 12/30/2019   Numbness 12/04/2019   Old disruption of left lateral collateral ligament 12/04/2019   Type 2 diabetes mellitus with hyperglycemia, without long-term current use of insulin (HCC) 10/19/2019   Dyslipidemia 10/19/2019   Morbid obesity with BMI of 50.0-59.9, adult (HCC) 09/23/2019   Chronic obstructive pulmonary disease (HCC) 06/19/2019   Hypertension    GERD (gastroesophageal reflux disease)    Acute hypoxemic respiratory failure due to severe acute respiratory syndrome coronavirus 2 (SARS-CoV-2) disease (HCC) 06/18/2019   CAD (coronary artery disease) 01/01/2019   Morbid obesity (HCC) 12/04/2018   Angina pectoris (HCC) 12/04/2018   Diabetes mellitus type 2 in obese  (HCC) 05/12/2018   Cough with hemoptysis 05/11/2018   Hemoptysis 05/11/2018   Trochanteric bursitis of left hip 03/02/2018   Pre-ulcerative corn or callous 01/30/2018   Thoracic spine pain 11/13/2017   Lumbar radiculopathy 09/19/2017   Pain from implanted hardware 08/01/2017   Plantar callus 08/01/2017   Plantar fasciitis 08/01/2017   Chronic pain syndrome 04/25/2017   Lumbar facet arthropathy 04/25/2017   Primary osteoarthritis of both hips 04/25/2017   Primary osteoarthritis of both knees 04/25/2017   Segmental dysfunction of lumbar region 04/25/2017   ACUTE PHARYNGITIS 05/23/2009   SINUSITIS- ACUTE-NOS 02/24/2009   HEADACHE 02/24/2009   Headache 02/24/2009   ASTHMATIC BRONCHITIS, ACUTE 04/27/2008   DYSPHAGIA 04/27/2008   MIGRAINE, CHRONIC 04/22/2008   Bronchitis, chronic (HCC) 04/22/2008   PERSONAL HISTORY ENDOCRN METABOLIC&IMMUNITY D/O 04/22/2008   Migraine headache 04/22/2008   Diabetes mellitus due to underlying condition with unspecified complications (HCC) 03/26/2007   DSORD CIRCADIAN RHY SHFT WRK SLEEP PHASE TYPE 03/26/2007   Allergic rhinitis 03/26/2007   ASTHMA NOS W/ACUTE EXACERBATION 03/26/2007   PEPTIC ULCER DISEASE 03/26/2007   RESTLESS LEG SYNDROME, HX OF 03/26/2007   IRRITABLE BOWEL SYNDROME, HX OF 03/26/2007   Asthma with exacerbation 03/26/2007   Hypothyroidism 01/05/2007   Mixed dyslipidemia 01/05/2007   DEPRESSION 01/05/2007   Sleep apnea, obstructive 01/05/2007   Essential hypertension 01/05/2007   GERD 01/05/2007   HAMMER TOE 01/05/2007   Depression 01/05/2007   Hyperlipidemia 01/05/2007    Current Outpatient Medications on File Prior to Visit  Medication Sig Dispense Refill   ACCU-CHEK AVIVA PLUS test strip  USE AS INSTRUCTED TO TEST BLOOD SUGAR UP TO 3 TIMES DAILY 300 strip 3   albuterol (PROVENTIL) (2.5 MG/3ML) 0.083% nebulizer solution Take 2.5 mg by nebulization every 6 (six) hours as needed for wheezing or shortness of breath.      albuterol  (VENTOLIN HFA) 108 (90 Base) MCG/ACT inhaler Inhale 1-2 puffs into the lungs every 6 (six) hours as needed for wheezing or shortness of breath.     Alcohol Swabs (DROPSAFE ALCOHOL PREP) 70 % PADS USE AS DIRECTED 100 each 0   aspirin EC 81 MG tablet Take 1 tablet (81 mg total) by mouth daily. 90 tablet 3   calcium carbonate (TUMS - DOSED IN MG ELEMENTAL CALCIUM) 500 MG chewable tablet Chew 1 tablet by mouth 3 (three) times daily.     Carboxymethylcellul-Glycerin (LUBRICATING EYE DROPS OP) Place 1 drop into both eyes daily as needed for dry eyes (dry eyes).     cyclobenzaprine (FLEXERIL) 10 MG tablet Take by mouth.     docusate sodium (COLACE) 100 MG capsule Take 1 capsule (100 mg total) by mouth 2 (two) times daily. 10 capsule 0   escitalopram (LEXAPRO) 20 MG tablet Take 20 mg by mouth at bedtime.      Evolocumab (REPATHA) 140 MG/ML SOSY Inject 140 mg into the skin every 14 (fourteen) days. 2.1 mL 11   fluconazole (DIFLUCAN) 150 MG tablet Take 150 mg by mouth daily as needed for other (burning sensation). Burning sensation     fluticasone (FLONASE) 50 MCG/ACT nasal spray Place 1 spray into both nostrils daily as needed for allergies.      Fluticasone-Umeclidin-Vilant (TRELEGY ELLIPTA) 100-62.5-25 MCG/INH AEPB Inhale 1 puff into the lungs daily.      gabapentin (NEURONTIN) 300 MG capsule Take 1 capsule (300 mg total) by mouth 2 (two) times daily. 90 capsule 0   hydrochlorothiazide (HYDRODIURIL) 25 MG tablet Take 25 mg by mouth daily.     ibuprofen (ADVIL) 800 MG tablet Take 1 tablet (800 mg total) by mouth every 8 (eight) hours as needed. 30 tablet 0   insulin aspart (NOVOLOG FLEXPEN) 100 UNIT/ML FlexPen Inject 2-3 Units into the skin daily as needed for high blood sugar (over 175).     levothyroxine (SYNTHROID) 88 MCG tablet Take 1 tablet (88 mcg total) by mouth daily. 90 tablet 3   lisinopril (ZESTRIL) 10 MG tablet Take 10 mg by mouth daily.     methocarbamol (ROBAXIN) 500 MG tablet Take by mouth.      methylPREDNISolone (MEDROL DOSEPAK) 4 MG TBPK tablet Take by mouth.     montelukast (SINGULAIR) 10 MG tablet Take by mouth.     Multiple Vitamins-Minerals (BARIATRIC MULTIVITAMINS/IRON PO) Take 1 tablet by mouth daily.     naloxone (NARCAN) nasal spray 4 mg/0.1 mL SMARTSIG:Both Nares     nitroGLYCERIN (NITROSTAT) 0.4 MG SL tablet Place 1 tablet (0.4 mg total) under the tongue every 5 (five) minutes as needed for chest pain. 25 tablet 6   nystatin (MYCOSTATIN/NYSTOP) powder Apply 1 application topically 2 (two) times daily as needed for dry skin (dry skin).     omeprazole (PRILOSEC) 40 MG capsule Take 40 mg by mouth daily.     OVER THE COUNTER MEDICATION Take 300 mg by mouth at bedtime as needed for sleep (sleep). CBD oil      pregabalin (LYRICA) 150 MG capsule Take 150 mg by mouth 3 (three) times daily.     promethazine (PHENERGAN) 25 MG tablet Take 1 tablet (25  mg total) by mouth every 8 (eight) hours as needed for nausea or vomiting. 20 tablet 0   terconazole (TERAZOL 7) 0.4 % vaginal cream Place 1 applicator vaginally at bedtime as needed for other (yeast infections). Yeast infection      topiramate (TOPAMAX) 25 MG tablet Take 25 mg by mouth at bedtime.     Vitamin D, Ergocalciferol, (DRISDOL) 1.25 MG (50000 UT) CAPS capsule Take 50,000 Units by mouth every Saturday.     No current facility-administered medications on file prior to visit.    Allergies  Allergen Reactions   Macrodantin [Nitrofurantoin Macrocrystal] Rash   Metformin And Related Other (See Comments)    Causes yeast infections   Atorvastatin Diarrhea   Hydromorphone Hcl Nausea And Vomiting   Lipitor [Atorvastatin Calcium] Diarrhea   Meperidine Hcl Nausea Only    Is ok if given with Phenergan   Tetracyclines & Related Rash    Objective: There were no vitals filed for this visit.  General: No acute distress, AAOx3  Right foot: K wires and sutures intact with no gapping or dehiscence at surgical sites, moderate  swelling and states that there is maceration to the plantar surface of the right foot, blanchable erythema likely consistent with swelling, no warmth, no drainage, no signs of infection noted, Capillary fill time <3 seconds in all digits, gross sensation present via light touch to right foot with mild guarding due to pain.  No pain with light touch to the lower leg.  Assessment and Plan:  Problem List Items Addressed This Visit   None Visit Diagnoses     Acquired deformity of right toe    -  Primary   Bunion       Acquired hammertoe of right foot       Tailor's bunionette, right       Right foot pain       Post-operative state            -Patient seen and evaluated -Sutures removed -Applied dry sterile dressing to surgical site right foot secured with Ace and stockinette and advised patient to keep intact and on next week may change dressing and apply dry gauze as I have instructed over the K wires again but must keep the K wires intact and refrain from bumping or hitting them until next visit -Advised patient to continue with pain medicine as previously prescribed; refilled hydrocodone this visit -Advised patient to continue with rest and elevation to assist with pain and edema control and to avoid complication or hitting pins like before -Advised patient to be compliant with postoperative instructions to avoid problems or complications and to prevent the dressing from getting wet with the pin sites from getting wet to avoid infection -Will plan for x-rays and possible pin removal at next visit.  Landis Martins, DPM

## 2021-08-28 ENCOUNTER — Other Ambulatory Visit: Payer: Self-pay

## 2021-08-28 ENCOUNTER — Other Ambulatory Visit (INDEPENDENT_AMBULATORY_CARE_PROVIDER_SITE_OTHER): Payer: Medicare HMO

## 2021-08-28 ENCOUNTER — Other Ambulatory Visit: Payer: Self-pay | Admitting: Internal Medicine

## 2021-08-28 DIAGNOSIS — E890 Postprocedural hypothyroidism: Secondary | ICD-10-CM

## 2021-08-28 LAB — BASIC METABOLIC PANEL
BUN: 16 mg/dL (ref 6–23)
CO2: 29 mEq/L (ref 19–32)
Calcium: 9.1 mg/dL (ref 8.4–10.5)
Chloride: 99 mEq/L (ref 96–112)
Creatinine, Ser: 0.91 mg/dL (ref 0.40–1.20)
GFR: 67.61 mL/min (ref >60.00)
Glucose, Bld: 153 mg/dL — ABNORMAL HIGH (ref 70–99)
Potassium: 4.1 mEq/L (ref 3.5–5.1)
Sodium: 135 mEq/L (ref 135–145)

## 2021-08-28 LAB — TSH: TSH: 0.69 u[IU]/mL (ref 0.35–5.50)

## 2021-08-29 ENCOUNTER — Ambulatory Visit (INDEPENDENT_AMBULATORY_CARE_PROVIDER_SITE_OTHER): Payer: Medicare HMO | Admitting: Sports Medicine

## 2021-08-29 ENCOUNTER — Encounter: Payer: Self-pay | Admitting: Sports Medicine

## 2021-08-29 ENCOUNTER — Ambulatory Visit (INDEPENDENT_AMBULATORY_CARE_PROVIDER_SITE_OTHER): Payer: Medicare HMO

## 2021-08-29 DIAGNOSIS — E0800 Diabetes mellitus due to underlying condition with hyperosmolarity without nonketotic hyperglycemic-hyperosmolar coma (NKHHC): Secondary | ICD-10-CM

## 2021-08-29 DIAGNOSIS — M2061 Acquired deformities of toe(s), unspecified, right foot: Secondary | ICD-10-CM

## 2021-08-29 DIAGNOSIS — Z794 Long term (current) use of insulin: Secondary | ICD-10-CM

## 2021-08-29 DIAGNOSIS — M21621 Bunionette of right foot: Secondary | ICD-10-CM

## 2021-08-29 DIAGNOSIS — Z9889 Other specified postprocedural states: Secondary | ICD-10-CM

## 2021-08-29 DIAGNOSIS — M2041 Other hammer toe(s) (acquired), right foot: Secondary | ICD-10-CM

## 2021-08-29 DIAGNOSIS — M79671 Pain in right foot: Secondary | ICD-10-CM

## 2021-08-29 DIAGNOSIS — M21619 Bunion of unspecified foot: Secondary | ICD-10-CM

## 2021-08-29 NOTE — Progress Notes (Signed)
Subjective: ?Liana CrockerSherry L Notte is a 61 y.o. female patient seen today in office for POV # 3 (DOS 07-31-21), S/P Right bunionectomy and hammertoe repair and tailor's bunionectomy with internal and external fixation.  Patient reports that she is doing better and is hopeful that her pains can come out today.  States that she did have a fall on Saturday while trying to do a U-turn using the knee scooter but did not fall on the foot. ? ?Patient Active Problem List  ? Diagnosis Date Noted  ? Fatigue 07/03/2021  ? Diabetes mellitus in remission (HCC) 07/03/2021  ? 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 Diabetes mellitus (HCC) 10/07/2020  ? Pre-operative cardiovascular examination 07/25/2020  ? Osteoarthritis 07/22/2020  ? Inflammatory arthritis 07/22/2020  ? Asthma   ? Coronary artery disease   ? Diabetes mellitus without complication (HCC)   ? Dyspnea   ? Fatty liver   ? Fibromyalgia   ? Sjogren's disease (HCC)   ? Sleep apnea   ? Thyroid disease   ? Pain in left shoulder 07/13/2020  ? Type 2 diabetes mellitus with diabetic polyneuropathy, with long-term current use of insulin (HCC) 01/26/2020  ? Preop cardiovascular exam 01/22/2020  ? Community acquired pneumonia 01/15/2020  ? Carpal tunnel syndrome of left wrist 12/30/2019  ? Trigger finger, left little finger 12/30/2019  ? Numbness 12/04/2019  ? Old disruption of left lateral collateral ligament 12/04/2019  ? Type 2 diabetes mellitus with hyperglycemia, without long-term current use of insulin (HCC) 10/19/2019  ? Dyslipidemia 10/19/2019  ? Morbid obesity with BMI of 50.0-59.9, adult (HCC) 09/23/2019  ? Chronic obstructive pulmonary disease (HCC) 06/19/2019  ? Hypertension   ? GERD (gastroesophageal reflux disease)   ? Acute hypoxemic respiratory failure due to severe acute respiratory syndrome coronavirus 2 (SARS-CoV-2) disease (HCC) 06/18/2019  ? CAD (coronary artery disease) 01/01/2019  ? Morbid obesity (HCC) 12/04/2018  ? Angina pectoris (HCC) 12/04/2018   ? Diabetes mellitus type 2 in obese (HCC) 05/12/2018  ? Cough with hemoptysis 05/11/2018  ? Hemoptysis 05/11/2018  ? Trochanteric bursitis of left hip 03/02/2018  ? Pre-ulcerative corn or callous 01/30/2018  ? Thoracic spine pain 11/13/2017  ? Lumbar radiculopathy 09/19/2017  ? Pain from implanted hardware 08/01/2017  ? Plantar callus 08/01/2017  ? Plantar fasciitis 08/01/2017  ? Chronic pain syndrome 04/25/2017  ? Lumbar facet arthropathy 04/25/2017  ? Primary osteoarthritis of both hips 04/25/2017  ? Primary osteoarthritis of both knees 04/25/2017  ? Segmental dysfunction of lumbar region 04/25/2017  ? ACUTE PHARYNGITIS 05/23/2009  ? SINUSITIS- ACUTE-NOS 02/24/2009  ? HEADACHE 02/24/2009  ? Headache 02/24/2009  ? ASTHMATIC BRONCHITIS, ACUTE 04/27/2008  ? DYSPHAGIA 04/27/2008  ? MIGRAINE, CHRONIC 04/22/2008  ? Bronchitis, chronic (HCC) 04/22/2008  ? PERSONAL HISTORY ENDOCRN METABOLIC&IMMUNITY D/O 04/22/2008  ? Migraine headache 04/22/2008  ? Diabetes mellitus due to underlying condition with unspecified complications (HCC) 03/26/2007  ? DSORD CIRCADIAN RHY SHFT WRK SLEEP PHASE TYPE 03/26/2007  ? Allergic rhinitis 03/26/2007  ? ASTHMA NOS W/ACUTE EXACERBATION 03/26/2007  ? PEPTIC ULCER DISEASE 03/26/2007  ? RESTLESS LEG SYNDROME, HX OF 03/26/2007  ? IRRITABLE BOWEL SYNDROME, HX OF 03/26/2007  ? Asthma with exacerbation 03/26/2007  ? Hypothyroidism 01/05/2007  ? Mixed dyslipidemia 01/05/2007  ? DEPRESSION 01/05/2007  ? Sleep apnea, obstructive 01/05/2007  ? Essential hypertension 01/05/2007  ? GERD 01/05/2007  ? HAMMER TOE 01/05/2007  ? Depression 01/05/2007  ? Hyperlipidemia 01/05/2007  ? ? ?Current Outpatient Medications on File Prior to Visit  ?Medication  Sig Dispense Refill  ? ACCU-CHEK AVIVA PLUS test strip USE AS INSTRUCTED TO TEST BLOOD SUGAR UP TO 3 TIMES DAILY 300 strip 3  ? albuterol (PROVENTIL) (2.5 MG/3ML) 0.083% nebulizer solution Take 2.5 mg by nebulization every 6 (six) hours as needed for wheezing or  shortness of breath.     ? albuterol (VENTOLIN HFA) 108 (90 Base) MCG/ACT inhaler Inhale 1-2 puffs into the lungs every 6 (six) hours as needed for wheezing or shortness of breath.    ? Alcohol Swabs (DROPSAFE ALCOHOL PREP) 70 % PADS USE AS DIRECTED 100 each 0  ? aspirin EC 81 MG tablet Take 1 tablet (81 mg total) by mouth daily. 90 tablet 3  ? calcium carbonate (TUMS - DOSED IN MG ELEMENTAL CALCIUM) 500 MG chewable tablet Chew 1 tablet by mouth 3 (three) times daily.    ? Carboxymethylcellul-Glycerin (LUBRICATING EYE DROPS OP) Place 1 drop into both eyes daily as needed for dry eyes (dry eyes).    ? cyclobenzaprine (FLEXERIL) 10 MG tablet Take by mouth.    ? docusate sodium (COLACE) 100 MG capsule Take 1 capsule (100 mg total) by mouth 2 (two) times daily. 10 capsule 0  ? escitalopram (LEXAPRO) 20 MG tablet Take 20 mg by mouth at bedtime.     ? Evolocumab (REPATHA) 140 MG/ML SOSY Inject 140 mg into the skin every 14 (fourteen) days. 2.1 mL 11  ? fluconazole (DIFLUCAN) 150 MG tablet Take 150 mg by mouth daily as needed for other (burning sensation). Burning sensation    ? fluticasone (FLONASE) 50 MCG/ACT nasal spray Place 1 spray into both nostrils daily as needed for allergies.     ? Fluticasone-Umeclidin-Vilant (TRELEGY ELLIPTA) 100-62.5-25 MCG/INH AEPB Inhale 1 puff into the lungs daily.     ? gabapentin (NEURONTIN) 300 MG capsule Take 1 capsule (300 mg total) by mouth 2 (two) times daily. 90 capsule 0  ? hydrochlorothiazide (HYDRODIURIL) 25 MG tablet Take 25 mg by mouth daily.    ? HYDROcodone-acetaminophen (NORCO) 10-325 MG tablet Take 1 tablet by mouth every 6 (six) hours as needed. 30 tablet 0  ? ibuprofen (ADVIL) 800 MG tablet Take 1 tablet (800 mg total) by mouth every 8 (eight) hours as needed. 30 tablet 0  ? insulin aspart (NOVOLOG FLEXPEN) 100 UNIT/ML FlexPen Inject 2-3 Units into the skin daily as needed for high blood sugar (over 175).    ? levothyroxine (SYNTHROID) 88 MCG tablet Take 1 tablet (88 mcg  total) by mouth daily. 90 tablet 3  ? lisinopril (ZESTRIL) 10 MG tablet Take 10 mg by mouth daily.    ? methocarbamol (ROBAXIN) 500 MG tablet Take by mouth.    ? methylPREDNISolone (MEDROL DOSEPAK) 4 MG TBPK tablet Take by mouth.    ? montelukast (SINGULAIR) 10 MG tablet Take by mouth.    ? Multiple Vitamins-Minerals (BARIATRIC MULTIVITAMINS/IRON PO) Take 1 tablet by mouth daily.    ? naloxone (NARCAN) nasal spray 4 mg/0.1 mL SMARTSIG:Both Nares    ? nitroGLYCERIN (NITROSTAT) 0.4 MG SL tablet Place 1 tablet (0.4 mg total) under the tongue every 5 (five) minutes as needed for chest pain. 25 tablet 6  ? nystatin (MYCOSTATIN/NYSTOP) powder Apply 1 application topically 2 (two) times daily as needed for dry skin (dry skin).    ? omeprazole (PRILOSEC) 40 MG capsule Take 40 mg by mouth daily.    ? OVER THE COUNTER MEDICATION Take 300 mg by mouth at bedtime as needed for sleep (sleep). CBD oil     ?  pregabalin (LYRICA) 150 MG capsule Take 150 mg by mouth 3 (three) times daily.    ? promethazine (PHENERGAN) 25 MG tablet Take 1 tablet (25 mg total) by mouth every 8 (eight) hours as needed for nausea or vomiting. 20 tablet 0  ? terconazole (TERAZOL 7) 0.4 % vaginal cream Place 1 applicator vaginally at bedtime as needed for other (yeast infections). Yeast infection     ? topiramate (TOPAMAX) 25 MG tablet Take 25 mg by mouth at bedtime.    ? Vitamin D, Ergocalciferol, (DRISDOL) 1.25 MG (50000 UT) CAPS capsule Take 50,000 Units by mouth every Saturday.    ? ?No current facility-administered medications on file prior to visit.  ? ? ?Allergies  ?Allergen Reactions  ? Macrodantin [Nitrofurantoin Macrocrystal] Rash  ? Metformin And Related Other (See Comments)  ?  Causes yeast infections  ? Atorvastatin Diarrhea  ? Hydromorphone Hcl Nausea And Vomiting  ? Lipitor [Atorvastatin Calcium] Diarrhea  ? Meperidine Hcl Nausea Only  ?  Is ok if given with Phenergan  ? Tetracyclines & Related Rash  ? ? ?Objective: ?There were no vitals  filed for this visit. ? ?General: No acute distress, AAOx3  ?Right foot: K wires intact with no gapping or dehiscence at surgical sites, minimal swelling, no drainage, no signs of infection noted, patien

## 2021-09-07 ENCOUNTER — Other Ambulatory Visit: Payer: Self-pay | Admitting: Sports Medicine

## 2021-09-08 ENCOUNTER — Telehealth: Payer: Self-pay | Admitting: *Deleted

## 2021-09-08 ENCOUNTER — Other Ambulatory Visit: Payer: Self-pay | Admitting: Sports Medicine

## 2021-09-08 MED ORDER — HYDROCODONE-ACETAMINOPHEN 10-325 MG PO TABS
1.0000 | ORAL_TABLET | Freq: Four times a day (QID) | ORAL | 0 refills | Status: AC | PRN
Start: 2021-09-08 — End: 2021-09-15

## 2021-09-08 NOTE — Progress Notes (Signed)
Pain meds refilled 

## 2021-09-08 NOTE — Telephone Encounter (Signed)
Called and spoke with the patient and relayed the message that the prescription has been sent to the pharmacy. Misty Stanley ?

## 2021-09-08 NOTE — Telephone Encounter (Signed)
-----   Message from Asencion Islam, [place] sent at 09/08/2021 11:15 AM EDT ----- ?Refill sent ?----- Message ----- ?From: Lanney Gins, PMAC ?Sent: 09/08/2021   8:33 AM EDT ?To: Asencion Islam, DPM ? ?Patient called and wanted to see about getting pain medicine and is in a lot of pain at the big toe and 2nd toe. Misty Stanley ? ? ?

## 2021-09-23 ENCOUNTER — Other Ambulatory Visit: Payer: Self-pay | Admitting: Internal Medicine

## 2021-09-24 ENCOUNTER — Other Ambulatory Visit: Payer: Self-pay | Admitting: Internal Medicine

## 2021-09-26 ENCOUNTER — Ambulatory Visit (INDEPENDENT_AMBULATORY_CARE_PROVIDER_SITE_OTHER): Payer: Medicare HMO

## 2021-09-26 ENCOUNTER — Ambulatory Visit (INDEPENDENT_AMBULATORY_CARE_PROVIDER_SITE_OTHER): Payer: Medicare HMO | Admitting: Sports Medicine

## 2021-09-26 ENCOUNTER — Encounter: Payer: Self-pay | Admitting: Sports Medicine

## 2021-09-26 DIAGNOSIS — M79674 Pain in right toe(s): Secondary | ICD-10-CM | POA: Diagnosis not present

## 2021-09-26 DIAGNOSIS — M2041 Other hammer toe(s) (acquired), right foot: Secondary | ICD-10-CM

## 2021-09-26 DIAGNOSIS — B351 Tinea unguium: Secondary | ICD-10-CM | POA: Diagnosis not present

## 2021-09-26 DIAGNOSIS — M2061 Acquired deformities of toe(s), unspecified, right foot: Secondary | ICD-10-CM | POA: Diagnosis not present

## 2021-09-26 DIAGNOSIS — M21619 Bunion of unspecified foot: Secondary | ICD-10-CM

## 2021-09-26 DIAGNOSIS — M79671 Pain in right foot: Secondary | ICD-10-CM

## 2021-09-26 DIAGNOSIS — E0800 Diabetes mellitus due to underlying condition with hyperosmolarity without nonketotic hyperglycemic-hyperosmolar coma (NKHHC): Secondary | ICD-10-CM

## 2021-09-26 DIAGNOSIS — Z794 Long term (current) use of insulin: Secondary | ICD-10-CM

## 2021-09-26 DIAGNOSIS — M79675 Pain in left toe(s): Secondary | ICD-10-CM | POA: Diagnosis not present

## 2021-09-26 DIAGNOSIS — M21621 Bunionette of right foot: Secondary | ICD-10-CM

## 2021-09-26 NOTE — Progress Notes (Signed)
Subjective: ?Marie Chen is a 61 y.o. female patient seen today in office for POV #4 (DOS 07-31-21), S/P Right bunionectomy and hammertoe repair and tailor's bunionectomy with internal and external fixation.  Patient reports that she stubbed her second and third toe when she was in the shower and there is some throbbing pain to the ends of the toes as well as to the side of the foot states that she is also here for diabetic nail care.  Patient denies any other pedal concerns at this time. ? ?Fasting blood sugar not recorded. ? ?Patient is assisted by significant other this visit. ? ?Patient Active Problem List  ? Diagnosis Date Noted  ? Fatigue 07/03/2021  ? Diabetes mellitus in remission (HCC) 07/03/2021  ? 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 Diabetes mellitus (HCC) 10/07/2020  ? Pre-operative cardiovascular examination 07/25/2020  ? Osteoarthritis 07/22/2020  ? Inflammatory arthritis 07/22/2020  ? Asthma   ? Coronary artery disease   ? Diabetes mellitus without complication (HCC)   ? Dyspnea   ? Fatty liver   ? Fibromyalgia   ? Sjogren's disease (HCC)   ? Sleep apnea   ? Thyroid disease   ? Pain in left shoulder 07/13/2020  ? Type 2 diabetes mellitus with diabetic polyneuropathy, with long-term current use of insulin (HCC) 01/26/2020  ? Preop cardiovascular exam 01/22/2020  ? Community acquired pneumonia 01/15/2020  ? Carpal tunnel syndrome of left wrist 12/30/2019  ? Trigger finger, left little finger 12/30/2019  ? Numbness 12/04/2019  ? Old disruption of left lateral collateral ligament 12/04/2019  ? Type 2 diabetes mellitus with hyperglycemia, without long-term current use of insulin (HCC) 10/19/2019  ? Dyslipidemia 10/19/2019  ? Morbid obesity with BMI of 50.0-59.9, adult (HCC) 09/23/2019  ? Chronic obstructive pulmonary disease (HCC) 06/19/2019  ? Hypertension   ? GERD (gastroesophageal reflux disease)   ? Acute hypoxemic respiratory failure due to severe acute respiratory syndrome  coronavirus 2 (SARS-CoV-2) disease (HCC) 06/18/2019  ? CAD (coronary artery disease) 01/01/2019  ? Morbid obesity (HCC) 12/04/2018  ? Angina pectoris (HCC) 12/04/2018  ? Diabetes mellitus type 2 in obese (HCC) 05/12/2018  ? Cough with hemoptysis 05/11/2018  ? Hemoptysis 05/11/2018  ? Trochanteric bursitis of left hip 03/02/2018  ? Pre-ulcerative corn or callous 01/30/2018  ? Thoracic spine pain 11/13/2017  ? Lumbar radiculopathy 09/19/2017  ? Pain from implanted hardware 08/01/2017  ? Plantar callus 08/01/2017  ? Plantar fasciitis 08/01/2017  ? Chronic pain syndrome 04/25/2017  ? Lumbar facet arthropathy 04/25/2017  ? Primary osteoarthritis of both hips 04/25/2017  ? Primary osteoarthritis of both knees 04/25/2017  ? Segmental dysfunction of lumbar region 04/25/2017  ? ACUTE PHARYNGITIS 05/23/2009  ? SINUSITIS- ACUTE-NOS 02/24/2009  ? HEADACHE 02/24/2009  ? Headache 02/24/2009  ? ASTHMATIC BRONCHITIS, ACUTE 04/27/2008  ? DYSPHAGIA 04/27/2008  ? MIGRAINE, CHRONIC 04/22/2008  ? Bronchitis, chronic (HCC) 04/22/2008  ? PERSONAL HISTORY ENDOCRN METABOLIC&IMMUNITY D/O 04/22/2008  ? Migraine headache 04/22/2008  ? Diabetes mellitus due to underlying condition with unspecified complications (HCC) 03/26/2007  ? DSORD CIRCADIAN RHY SHFT WRK SLEEP PHASE TYPE 03/26/2007  ? Allergic rhinitis 03/26/2007  ? ASTHMA NOS W/ACUTE EXACERBATION 03/26/2007  ? PEPTIC ULCER DISEASE 03/26/2007  ? RESTLESS LEG SYNDROME, HX OF 03/26/2007  ? IRRITABLE BOWEL SYNDROME, HX OF 03/26/2007  ? Asthma with exacerbation 03/26/2007  ? Hypothyroidism 01/05/2007  ? Mixed dyslipidemia 01/05/2007  ? DEPRESSION 01/05/2007  ? Sleep apnea, obstructive 01/05/2007  ? Essential hypertension 01/05/2007  ? GERD  01/05/2007  ? HAMMER TOE 01/05/2007  ? Depression 01/05/2007  ? Hyperlipidemia 01/05/2007  ? ? ?Current Outpatient Medications on File Prior to Visit  ?Medication Sig Dispense Refill  ? omeprazole (PRILOSEC) 40 MG capsule Take 1 capsule by mouth daily.    ?  ACCU-CHEK AVIVA PLUS test strip USE AS INSTRUCTED TO TEST BLOOD SUGAR UP TO 3 TIMES DAILY 300 strip 3  ? albuterol (PROVENTIL) (2.5 MG/3ML) 0.083% nebulizer solution Take 2.5 mg by nebulization every 6 (six) hours as needed for wheezing or shortness of breath.     ? albuterol (VENTOLIN HFA) 108 (90 Base) MCG/ACT inhaler Inhale 1-2 puffs into the lungs every 6 (six) hours as needed for wheezing or shortness of breath.    ? Alcohol Swabs (DROPSAFE ALCOHOL PREP) 70 % PADS USE AS DIRECTED 100 each 0  ? aspirin EC 81 MG tablet Take 1 tablet (81 mg total) by mouth daily. 90 tablet 3  ? calcium carbonate (TUMS - DOSED IN MG ELEMENTAL CALCIUM) 500 MG chewable tablet Chew 1 tablet by mouth 3 (three) times daily.    ? Carboxymethylcellul-Glycerin (LUBRICATING EYE DROPS OP) Place 1 drop into both eyes daily as needed for dry eyes (dry eyes).    ? cyclobenzaprine (FLEXERIL) 10 MG tablet Take by mouth.    ? docusate sodium (COLACE) 100 MG capsule Take 1 capsule (100 mg total) by mouth 2 (two) times daily. 10 capsule 0  ? escitalopram (LEXAPRO) 20 MG tablet Take 20 mg by mouth at bedtime.     ? fluticasone (FLONASE) 50 MCG/ACT nasal spray Place 1 spray into both nostrils daily as needed for allergies.     ? Fluticasone-Umeclidin-Vilant (TRELEGY ELLIPTA) 100-62.5-25 MCG/INH AEPB Inhale 1 puff into the lungs daily.     ? gabapentin (NEURONTIN) 300 MG capsule Take 1 capsule (300 mg total) by mouth 2 (two) times daily. 90 capsule 0  ? hydrochlorothiazide (HYDRODIURIL) 25 MG tablet Take 25 mg by mouth daily.    ? ibuprofen (ADVIL) 800 MG tablet Take 1 tablet (800 mg total) by mouth every 8 (eight) hours as needed. 30 tablet 0  ? insulin aspart (NOVOLOG FLEXPEN) 100 UNIT/ML FlexPen Inject 2-3 Units into the skin daily as needed for high blood sugar (over 175).    ? levothyroxine (SYNTHROID) 88 MCG tablet Take 1 tablet (88 mcg total) by mouth daily. 90 tablet 3  ? lisinopril (ZESTRIL) 10 MG tablet Take 10 mg by mouth daily.    ?  methocarbamol (ROBAXIN) 500 MG tablet Take by mouth.    ? montelukast (SINGULAIR) 10 MG tablet Take by mouth.    ? Multiple Vitamins-Minerals (BARIATRIC MULTIVITAMINS/IRON PO) Take 1 tablet by mouth daily.    ? naloxone (NARCAN) nasal spray 4 mg/0.1 mL SMARTSIG:Both Nares    ? nitroGLYCERIN (NITROSTAT) 0.4 MG SL tablet Place 1 tablet (0.4 mg total) under the tongue every 5 (five) minutes as needed for chest pain. 25 tablet 6  ? nystatin (MYCOSTATIN/NYSTOP) powder Apply 1 application topically 2 (two) times daily as needed for dry skin (dry skin).    ? oxycodone-acetaminophen (LYNOX) 10-300 MG tablet Take by mouth.    ? pregabalin (LYRICA) 150 MG capsule Take 150 mg by mouth 3 (three) times daily.    ? REPATHA 140 MG/ML SOSY INJECT 140MG  SUBCUTANEOUSLY EVERY 2 WEEKS 6 mL 5  ? terconazole (TERAZOL 7) 0.4 % vaginal cream Place 1 applicator vaginally at bedtime as needed for other (yeast infections). Yeast infection     ?  topiramate (TOPAMAX) 25 MG tablet Take 25 mg by mouth at bedtime.    ? Vitamin D, Ergocalciferol, (DRISDOL) 1.25 MG (50000 UT) CAPS capsule Take 50,000 Units by mouth every Saturday.    ? ?No current facility-administered medications on file prior to visit.  ? ? ?Allergies  ?Allergen Reactions  ? Macrodantin [Nitrofurantoin Macrocrystal] Rash  ? Metformin And Related Other (See Comments)  ?  Causes yeast infections  ? Atorvastatin Diarrhea  ? Hydromorphone Hcl Nausea And Vomiting  ? Lipitor [Atorvastatin Calcium] Diarrhea  ? Meperidine Hcl Nausea Only  ?  Is ok if given with Phenergan  ? Tetracyclines & Related Rash  ? ? ?Objective: ?There were no vitals filed for this visit. ? ?General: No acute distress, AAOx3  ?Right foot: Surgical sites well-healed, there is mild swelling noted to the lesser toes 2 through 5 on the right, no drainage, no signs of infection noted, minimal tenderness to palpation to the lateral right foot.  Capillary fill time <3 seconds in all digits, gross sensation present via  light touch to right foot with minimal pain. ?Nails x8 are thick skin and mycotic patient has previous bilateral hallux nail avulsion procedures with only skin there noted in the nailbed that appears to be well-healed

## 2021-10-09 ENCOUNTER — Telehealth: Payer: Self-pay | Admitting: Urology

## 2021-10-09 NOTE — Telephone Encounter (Signed)
Pt called wanting to know if she could have one last refill on pain meds. Please Advise.  ?

## 2021-10-10 ENCOUNTER — Other Ambulatory Visit: Payer: Self-pay | Admitting: Sports Medicine

## 2021-10-10 MED ORDER — HYDROCODONE-ACETAMINOPHEN 5-325 MG PO TABS: 1.0000 | ORAL_TABLET | Freq: Four times a day (QID) | ORAL | 0 refills | Status: AC | PRN

## 2021-10-10 NOTE — Progress Notes (Signed)
Refilled pain medication this last time ?-Dr. [name] ?

## 2021-10-24 ENCOUNTER — Encounter: Payer: Medicare HMO | Admitting: Sports Medicine

## 2021-10-25 ENCOUNTER — Ambulatory Visit (INDEPENDENT_AMBULATORY_CARE_PROVIDER_SITE_OTHER): Payer: Medicare HMO | Admitting: Sports Medicine

## 2021-10-25 ENCOUNTER — Encounter: Payer: Self-pay | Admitting: Sports Medicine

## 2021-10-25 ENCOUNTER — Ambulatory Visit (INDEPENDENT_AMBULATORY_CARE_PROVIDER_SITE_OTHER): Payer: Medicare HMO

## 2021-10-25 DIAGNOSIS — M2061 Acquired deformities of toe(s), unspecified, right foot: Secondary | ICD-10-CM

## 2021-10-25 DIAGNOSIS — M79671 Pain in right foot: Secondary | ICD-10-CM

## 2021-10-25 DIAGNOSIS — M2041 Other hammer toe(s) (acquired), right foot: Secondary | ICD-10-CM

## 2021-10-25 DIAGNOSIS — M21619 Bunion of unspecified foot: Secondary | ICD-10-CM

## 2021-10-25 DIAGNOSIS — M21621 Bunionette of right foot: Secondary | ICD-10-CM

## 2021-10-25 DIAGNOSIS — E0800 Diabetes mellitus due to underlying condition with hyperosmolarity without nonketotic hyperglycemic-hyperosmolar coma (NKHHC): Secondary | ICD-10-CM

## 2021-10-25 DIAGNOSIS — Z794 Long term (current) use of insulin: Secondary | ICD-10-CM

## 2021-10-25 NOTE — Progress Notes (Signed)
Subjective: ?RAIA Chen is a 61 y.o. female patient seen today in office for POV #5 (DOS 07-31-21), S/P Right bunionectomy and hammertoe repair and tailor's bunionectomy with internal and external fixation.  Patient reports that she get some soreness at 5th toe and some numbness at 2nd toe.  Patient denies any other pedal concerns at this time. ? ?Fasting blood sugar not recorded. ? ?Patient Active Problem List  ? Diagnosis Date Noted  ? Fatigue 07/03/2021  ? Diabetes mellitus in remission (HCC) 07/03/2021  ? 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 Diabetes mellitus (HCC) 10/07/2020  ? Pre-operative cardiovascular examination 07/25/2020  ? Osteoarthritis 07/22/2020  ? Inflammatory arthritis 07/22/2020  ? Asthma   ? Coronary artery disease   ? Diabetes mellitus without complication (HCC)   ? Dyspnea   ? Fatty liver   ? Fibromyalgia   ? Sjogren's disease (HCC)   ? Sleep apnea   ? Thyroid disease   ? Pain in left shoulder 07/13/2020  ? Type 2 diabetes mellitus with diabetic polyneuropathy, with long-term current use of insulin (HCC) 01/26/2020  ? Preop cardiovascular exam 01/22/2020  ? Community acquired pneumonia 01/15/2020  ? Carpal tunnel syndrome of left wrist 12/30/2019  ? Trigger finger, left little finger 12/30/2019  ? Numbness 12/04/2019  ? Old disruption of left lateral collateral ligament 12/04/2019  ? Type 2 diabetes mellitus with hyperglycemia, without long-term current use of insulin (HCC) 10/19/2019  ? Dyslipidemia 10/19/2019  ? Morbid obesity with BMI of 50.0-59.9, adult (HCC) 09/23/2019  ? Chronic obstructive pulmonary disease (HCC) 06/19/2019  ? Hypertension   ? GERD (gastroesophageal reflux disease)   ? Acute hypoxemic respiratory failure due to severe acute respiratory syndrome coronavirus 2 (SARS-CoV-2) disease (HCC) 06/18/2019  ? CAD (coronary artery disease) 01/01/2019  ? Morbid obesity (HCC) 12/04/2018  ? Angina pectoris (HCC) 12/04/2018  ? Vitamin D deficiency, unspecified  08/25/2018  ? Diabetes mellitus type 2 in obese (HCC) 05/12/2018  ? Cough with hemoptysis 05/11/2018  ? Hemoptysis 05/11/2018  ? Trochanteric bursitis of left hip 03/02/2018  ? Pre-ulcerative corn or callous 01/30/2018  ? Thoracic spine pain 11/13/2017  ? Lumbar radiculopathy 09/19/2017  ? Pain from implanted hardware 08/01/2017  ? Plantar callus 08/01/2017  ? Plantar fasciitis 08/01/2017  ? Chronic pain syndrome 04/25/2017  ? Lumbar facet arthropathy 04/25/2017  ? Primary osteoarthritis of both hips 04/25/2017  ? Primary osteoarthritis of both knees 04/25/2017  ? Segmental dysfunction of lumbar region 04/25/2017  ? Pain in unspecified knee 03/04/2017  ? ACUTE PHARYNGITIS 05/23/2009  ? SINUSITIS- ACUTE-NOS 02/24/2009  ? HEADACHE 02/24/2009  ? Headache 02/24/2009  ? ASTHMATIC BRONCHITIS, ACUTE 04/27/2008  ? DYSPHAGIA 04/27/2008  ? MIGRAINE, CHRONIC 04/22/2008  ? Bronchitis, chronic (HCC) 04/22/2008  ? PERSONAL HISTORY ENDOCRN METABOLIC&IMMUNITY D/O 04/22/2008  ? Migraine headache 04/22/2008  ? Diabetes mellitus due to underlying condition with unspecified complications (HCC) 03/26/2007  ? DSORD CIRCADIAN RHY SHFT WRK SLEEP PHASE TYPE 03/26/2007  ? Allergic rhinitis 03/26/2007  ? ASTHMA NOS W/ACUTE EXACERBATION 03/26/2007  ? PEPTIC ULCER DISEASE 03/26/2007  ? RESTLESS LEG SYNDROME, HX OF 03/26/2007  ? IRRITABLE BOWEL SYNDROME, HX OF 03/26/2007  ? Asthma with exacerbation 03/26/2007  ? Hypothyroidism 01/05/2007  ? Mixed dyslipidemia 01/05/2007  ? DEPRESSION 01/05/2007  ? Sleep apnea, obstructive 01/05/2007  ? Essential hypertension 01/05/2007  ? GERD 01/05/2007  ? HAMMER TOE 01/05/2007  ? Depression 01/05/2007  ? Hyperlipidemia 01/05/2007  ? ? ?Current Outpatient Medications on File Prior to Visit  ?  Medication Sig Dispense Refill  ? omeprazole (PRILOSEC) 40 MG capsule Take by mouth.    ? ACCU-CHEK AVIVA PLUS test strip USE AS INSTRUCTED TO TEST BLOOD SUGAR UP TO 3 TIMES DAILY 300 strip 3  ? albuterol (PROVENTIL) (2.5  MG/3ML) 0.083% nebulizer solution Take 2.5 mg by nebulization every 6 (six) hours as needed for wheezing or shortness of breath.     ? Alcohol Swabs (DROPSAFE ALCOHOL PREP) 70 % PADS USE AS DIRECTED 100 each 0  ? aspirin EC 81 MG tablet Take 1 tablet (81 mg total) by mouth daily. 90 tablet 3  ? calcium carbonate (TUMS - DOSED IN MG ELEMENTAL CALCIUM) 500 MG chewable tablet Chew 1 tablet by mouth 3 (three) times daily.    ? Carboxymethylcellul-Glycerin (LUBRICATING EYE DROPS OP) Place 1 drop into both eyes daily as needed for dry eyes (dry eyes).    ? cyclobenzaprine (FLEXERIL) 10 MG tablet Take by mouth.    ? docusate sodium (COLACE) 100 MG capsule Take 1 capsule (100 mg total) by mouth 2 (two) times daily. 10 capsule 0  ? escitalopram (LEXAPRO) 20 MG tablet Take 20 mg by mouth at bedtime.     ? fluticasone (FLONASE) 50 MCG/ACT nasal spray Place 1 spray into both nostrils daily as needed for allergies.     ? Fluticasone-Umeclidin-Vilant (TRELEGY ELLIPTA) 100-62.5-25 MCG/INH AEPB Inhale 1 puff into the lungs daily.     ? gabapentin (NEURONTIN) 300 MG capsule Take 1 capsule (300 mg total) by mouth 2 (two) times daily. 90 capsule 0  ? hydrochlorothiazide (HYDRODIURIL) 25 MG tablet Take 25 mg by mouth daily.    ? ibuprofen (ADVIL) 800 MG tablet Take 1 tablet (800 mg total) by mouth every 8 (eight) hours as needed. 30 tablet 0  ? insulin aspart (NOVOLOG FLEXPEN) 100 UNIT/ML FlexPen Inject 2-3 Units into the skin daily as needed for high blood sugar (over 175).    ? levothyroxine (SYNTHROID) 88 MCG tablet Take 1 tablet (88 mcg total) by mouth daily. 90 tablet 3  ? lisinopril (ZESTRIL) 10 MG tablet Take 10 mg by mouth daily.    ? methocarbamol (ROBAXIN) 500 MG tablet Take by mouth.    ? montelukast (SINGULAIR) 10 MG tablet Take by mouth.    ? Multiple Vitamins-Minerals (BARIATRIC MULTIVITAMINS/IRON PO) Take 1 tablet by mouth daily.    ? naloxone (NARCAN) nasal spray 4 mg/0.1 mL SMARTSIG:Both Nares    ? nitroGLYCERIN  (NITROSTAT) 0.4 MG SL tablet Place 1 tablet (0.4 mg total) under the tongue every 5 (five) minutes as needed for chest pain. 25 tablet 6  ? nystatin (MYCOSTATIN/NYSTOP) powder Apply 1 application topically 2 (two) times daily as needed for dry skin (dry skin).    ? omeprazole (PRILOSEC) 40 MG capsule Take 1 capsule by mouth daily.    ? oxycodone-acetaminophen (LYNOX) 10-300 MG tablet Take by mouth.    ? pregabalin (LYRICA) 150 MG capsule Take 150 mg by mouth 3 (three) times daily.    ? REPATHA 140 MG/ML SOSY INJECT 140MG  SUBCUTANEOUSLY EVERY 2 WEEKS 6 mL 5  ? terconazole (TERAZOL 7) 0.4 % vaginal cream Place 1 applicator vaginally at bedtime as needed for other (yeast infections). Yeast infection     ? topiramate (TOPAMAX) 25 MG tablet Take 25 mg by mouth at bedtime.    ? Vitamin D, Ergocalciferol, (DRISDOL) 1.25 MG (50000 UT) CAPS capsule Take 50,000 Units by mouth every Saturday.    ? ?No current facility-administered medications on file  prior to visit.  ? ? ?Allergies  ?Allergen Reactions  ? Macrodantin [Nitrofurantoin Macrocrystal] Rash  ? Metformin And Related Other (See Comments)  ?  Causes yeast infections  ? Atorvastatin Diarrhea  ? Hydromorphone Hcl Nausea And Vomiting  ? Lipitor [Atorvastatin Calcium] Diarrhea  ? Meperidine Hcl Nausea Only  ?  Is ok if given with Phenergan  ? Tetracyclines & Related Rash  ? ? ?Objective: ?There were no vitals filed for this visit. ? ?General: No acute distress, AAOx3  ?Right foot: Surgical sites well-healed, there is mild swelling noted to the lesser toes 2 through 5 on the right, no drainage, no signs of infection noted, minimal tenderness to palpation to the lateral right foot.  Capillary fill time <3 seconds in all digits, gross sensation present via light touch to right foot with minimal pain to 5th MTPJ and numbness to right 2nd toe. ? ?Assessment and Plan:  ?Problem List Items Addressed This Visit   ? ?  ? Endocrine  ? Diabetes mellitus (HCC)  ? ?Other Visit  Diagnoses   ? ? Acquired deformity of right toe    -  Primary  ? Relevant Orders  ? DG Foot Complete Right  ? Bunion      ? Acquired hammertoe of right foot      ? Tailor's bunionette, right      ? Right foot pain      ?

## 2021-10-27 ENCOUNTER — Other Ambulatory Visit: Payer: Self-pay

## 2021-10-30 ENCOUNTER — Ambulatory Visit: Payer: Medicare HMO | Admitting: Cardiology

## 2021-10-30 ENCOUNTER — Encounter: Payer: Self-pay | Admitting: Cardiology

## 2021-10-30 VITALS — BP 116/74 | HR 62 | Ht 67.6 in | Wt 245.0 lb

## 2021-10-30 DIAGNOSIS — I10 Essential (primary) hypertension: Secondary | ICD-10-CM

## 2021-10-30 DIAGNOSIS — E782 Mixed hyperlipidemia: Secondary | ICD-10-CM | POA: Diagnosis not present

## 2021-10-30 DIAGNOSIS — I2510 Atherosclerotic heart disease of native coronary artery without angina pectoris: Secondary | ICD-10-CM | POA: Diagnosis not present

## 2021-10-30 DIAGNOSIS — E669 Obesity, unspecified: Secondary | ICD-10-CM

## 2021-10-30 DIAGNOSIS — E088 Diabetes mellitus due to underlying condition with unspecified complications: Secondary | ICD-10-CM | POA: Diagnosis not present

## 2021-10-30 HISTORY — DX: Obesity, unspecified: E66.9

## 2021-10-30 NOTE — Patient Instructions (Signed)
Medication Instructions:  Your physician recommends that you continue on your current medications as directed. Please refer to the Current Medication list given to you today.  *If you need a refill on your cardiac medications before your next appointment, please call your pharmacy*   Lab Work: None If you have labs (blood work) drawn today and your tests are completely normal, you will receive your results only by: MyChart Message (if you have MyChart) OR A paper copy in the mail If you have any lab test that is abnormal or we need to change your treatment, we will call you to review the results.   Testing/Procedures: None   Follow-Up: At [facility], you and your health needs are our priority.  As part of our continuing mission to provide you with exceptional heart care, we have created designated Provider Care Teams.  These Care Teams include your primary Cardiologist (physician) and Advanced Practice Providers (APPs -  Physician Assistants and Nurse Practitioners) who all work together to provide you with the care you need, when you need it.  We recommend signing up for the patient portal called "MyChart".  Sign up information is provided on this After Visit Summary.  MyChart is used to connect with patients for Virtual Visits (Telemedicine).  Patients are able to view lab/test results, encounter notes, upcoming appointments, etc.  Non-urgent messages can be sent to your provider as well.   To learn more about what you can do with MyChart, go to [email or web address].    Your next appointment:   9 month(s)  The format for your next appointment:   In Person  Provider:   Dr. [name] MD}    Other Instructions None  Important Information About Sugar

## 2021-10-30 NOTE — Progress Notes (Signed)
Cardiology Office Note:    Date:  10/30/2021   ID:  Marie Chen, DOB 08/03/1960, MRN [id number]  PCP:  [name], [name]  Cardiologist:  Garwin Brothers, MD   Referring MD: [name], [name]    ASSESSMENT:    1. Coronary artery disease involving native coronary artery of native heart without angina pectoris   2. Essential hypertension   3. Mixed hyperlipidemia   4. Diabetes mellitus due to underlying condition with unspecified complications (HCC)   5. Obesity (BMI 35.0-39.9 without comorbidity)    PLAN:    In order of problems listed above:  Coronary artery disease: Secondary prevention stressed with the patient.  Importance of compliance with diet and medication stressed and she vocalized understanding.  She was advised to walk at least half an hour a day 5 days a week and she promises to do so. Essential hypertension: Blood pressure stable and diet was emphasized. Mixed dyslipidemia: Lipids followed by endocrinologist.  I told her that her goal LDL must be less than 70 and she is going to talk to her endocrinologist about this. Obesity: Weight reduction stressed and diet was emphasized.  Risks of obesity explained. Diet for diabetes mellitus was emphasized this is followed by endocrinologist. Patient will be seen in follow-up appointment in 9 months or earlier if the patient has any concerns    Medication Adjustments/Labs and Tests Ordered: Current medicines are reviewed at length with the patient today.  Concerns regarding medicines are outlined above.  No orders of the defined types were placed in this encounter.  No orders of the defined types were placed in this encounter.    No chief complaint on file.    History of Present Illness:    Marie Chen is a 61 y.o. female.  Patient has past medical history of coronary artery disease, essential hypertension, mixed dyslipidemia diabetes mellitus and obesity.  She denies any problems at this time and takes  care of activities of daily living.  Her coronary angiography revealed nonobstructive disease and the details are mentioned below.  Past Medical History:  Diagnosis Date   Acute hypoxemic respiratory failure due to severe acute respiratory syndrome coronavirus 2 (SARS-CoV-2) disease (HCC) 06/18/2019   Acute pharyngitis 05/23/2009   Qualifier: Diagnosis of  By: Felicity Coyer MD, Vikki Ports A    Allergic rhinitis 03/26/2007   Qualifier: Diagnosis of  By: Jonny Ruiz MD, Len Blalock   Formatting of this note might be different from the original. Overview:  Qualifier: Diagnosis of  By: Jonny Ruiz MD, Len Blalock   Angina pectoris (HCC) 12/04/2018   Asthma    severe   ASTHMA NOS W/ACUTE EXACERBATION 03/26/2007   Qualifier: Diagnosis of  By: Jonny Ruiz MD, Len Blalock    Asthma with exacerbation 03/26/2007   Formatting of this note might be different from the original. Overview:  Qualifier: Diagnosis of  By: Jonny Ruiz MD, Len Blalock   ASTHMATIC BRONCHITIS, ACUTE 04/27/2008   Qualifier: Diagnosis of  By: Jonny Ruiz MD, Len Blalock    Bronchitis, chronic (HCC) 04/22/2008   Qualifier: Diagnosis of  By: Thurnell Lose of this note might be different from the original. Overview:  Qualifier: Diagnosis of  By: Alesia Richards   CAD (coronary artery disease) 01/01/2019   Carpal tunnel syndrome of left wrist 12/30/2019   Chronic obstructive pulmonary disease (HCC) 06/19/2019   Chronic pain syndrome 04/25/2017   Community acquired pneumonia 01/15/2020   Coronary artery disease    Cough with  hemoptysis 05/11/2018   Depression    DEPRESSION 01/05/2007   Qualifier: Diagnosis of  By: Jonny Ruiz MD, Len Blalock    Diabetes mellitus (HCC) 10/07/2020   Diabetes mellitus due to underlying condition with unspecified complications (HCC) 03/26/2007   Qualifier: Diagnosis of  By: Jonny Ruiz MD, Len Blalock    Diabetes mellitus in remission (HCC) 07/03/2021   Diabetes mellitus type 2 in obese (HCC) 05/12/2018   Diabetes mellitus without complication (HCC)    type 2    DSORD CIRCADIAN RHY SHFT WRK SLEEP PHASE TYPE 03/26/2007   Qualifier: Diagnosis of  By: Jonny Ruiz MD, Len Blalock   Formatting of this note might be different from the original. Overview:  Qualifier: Diagnosis of  By: Jonny Ruiz MD, Len Blalock   Dyslipidemia 10/19/2019   DYSPHAGIA 04/27/2008   Qualifier: Diagnosis of  By: Marina Goodell MD, Wilhemina Bonito    Dyspnea    with exertion   Essential hypertension 01/05/2007   Qualifier: Diagnosis of  By: Jonny Ruiz MD, Len Blalock    Fatigue 07/03/2021   Fatty liver    Fibromyalgia    GERD 01/05/2007   Qualifier: Diagnosis of  By: Jonny Ruiz MD, Len Blalock    GERD (gastroesophageal reflux disease)    HAMMER TOE 01/05/2007   Qualifier: Diagnosis of  By: Jonny Ruiz MD, Len Blalock    Headache 02/24/2009   Qualifier: Diagnosis of  By: Jonny Ruiz MD, Len Blalock   HEADACHE 02/24/2009   Qualifier: Diagnosis of  By: Jonny Ruiz MD, Len Blalock    Hemoptysis 05/11/2018   Hyperlipidemia 01/05/2007   diet controlled Formatting of this note might be different from the original. Overview:  Qualifier: Diagnosis of  By: Jonny Ruiz MD, Len Blalock   Hypertension    Hypothyroidism    Inflammatory arthritis 07/22/2020   IRRITABLE BOWEL SYNDROME, HX OF 03/26/2007   Qualifier: Diagnosis of  By: Jonny Ruiz MD, Len Blalock    Lumbar facet arthropathy 04/25/2017   Lumbar radiculopathy 09/19/2017   Migraine headache 04/22/2008   Formatting of this note might be different from the original. Overview:  Qualifier: Diagnosis of  By: Veryl Speak, CHRONIC 04/22/2008   Qualifier: Diagnosis of  By: Alesia Richards     Mixed dyslipidemia 01/05/2007   Qualifier: Diagnosis of  By: Jonny Ruiz MD, Len Blalock    Morbid obesity (HCC) 12/04/2018   Morbid obesity with BMI of 50.0-59.9, adult (HCC) 09/23/2019   Numbness 12/04/2019   Old disruption of left lateral collateral ligament 12/04/2019   Osteoarthritis 07/22/2020   Pain from implanted hardware 08/01/2017   Pain in left shoulder 07/13/2020   Pain in unspecified knee 03/04/2017   PEPTIC ULCER  DISEASE 03/26/2007   Qualifier: Diagnosis of  By: Jonny Ruiz MD, Len Blalock    PERSONAL HISTORY ENDOCRN METABOLIC&IMMUNITY D/O 04/22/2008   Qualifier: Diagnosis of  By: Kem Boroughs, Barbara     Plantar callus 08/01/2017   Plantar fasciitis 08/01/2017   Pre-operative cardiovascular examination 07/25/2020   Pre-ulcerative corn or callous 01/30/2018   Preop cardiovascular exam 01/22/2020   Primary osteoarthritis of both hips 04/25/2017   Primary osteoarthritis of both knees 04/25/2017   RESTLESS LEG SYNDROME, HX OF 03/26/2007   Qualifier: Diagnosis of  By: Jonny Ruiz MD, Len Blalock    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 Overview:  Qualifier: Diagnosis of  By: Jonny Ruiz MD, Len Blalock   Thoracic spine pain 11/13/2017   Thyroid disease    Trigger finger, left little finger 12/30/2019   Trochanteric bursitis of left hip 03/02/2018   Type 2 diabetes mellitus with diabetic polyneuropathy, with long-term current use of insulin (HCC) 01/26/2020   Type 2 diabetes mellitus with hyperglycemia, without long-term current use of insulin (HCC) 10/19/2019   Vitamin D deficiency, unspecified 08/25/2018    Past Surgical History:  Procedure Laterality Date   ANKLE SURGERY  2010   BACK SURGERY     CARPAL TUNNEL RELEASE Bilateral    CHOLECYSTECTOMY     COLONOSCOPY  04/2014   FOOT SURGERY     bunion surgery bialteral   HYSTEROSCOPY     LEFT HEART CATH AND CORONARY ANGIOGRAPHY N/A 12/19/2018   Procedure: LEFT HEART CATH AND CORONARY ANGIOGRAPHY;  Surgeon: Yvonne Kendall, MD;  Location: [facility];  Service: Cardiovascular;  Laterality: N/A;   LEFT HEART CATH AND CORONARY  ANGIOGRAPHY N/A 02/16/2021   Procedure: LEFT HEART CATH AND CORONARY ANGIOGRAPHY;  Surgeon: Yvonne Kendall, MD;  Location: [facility];  Service: Cardiovascular;  Laterality: N/A;   LIGAMENT REPAIR Left 02/04/2020   Procedure: REPAIR RADIAL COLLATERAL LIGAMENT METACARPAL PHALANGEAL LEFT SMALL FINGER; POSSIBLE PINNING;  Surgeon: Cindee Salt, MD;  Location: [facility] OR;  Service: Orthopedics;  Laterality: Left;  AXILLARY BLOCK   RESECTION DISTAL CLAVICAL Right    STERIOD INJECTION Left 02/04/2020   Procedure: INJECTION LEFT CARPAL TUNNEL;  Surgeon: Cindee Salt, MD;  Location: [facility] OR;  Service: Orthopedics;  Laterality: Left;   TUBAL LIGATION     UPPER GI ENDOSCOPY  04/2014   WISDOM TOOTH EXTRACTION      Current Medications: Current Meds  Medication Sig   ACCU-CHEK AVIVA PLUS test strip USE AS INSTRUCTED TO TEST BLOOD SUGAR UP TO 3 TIMES DAILY   albuterol (PROVENTIL) (2.5 MG/3ML) 0.083% nebulizer solution Take 2.5 mg by nebulization every 6 (six) hours as needed for wheezing or shortness of breath.    Alcohol Swabs (DROPSAFE ALCOHOL PREP) 70 % PADS USE AS DIRECTED   aspirin EC 81 MG tablet Take 1 tablet (81 mg total) by mouth daily.   calcium carbonate (TUMS - DOSED IN MG ELEMENTAL CALCIUM) 500 MG chewable tablet Chew 1 tablet by mouth 3 (three) times daily.   Carboxymethylcellul-Glycerin (LUBRICATING EYE DROPS OP) Place 1 drop into both eyes daily as needed for dry eyes (dry eyes).   docusate sodium (COLACE) 100 MG capsule Take 1 capsule (100 mg total) by mouth 2 (two) times daily. (Patient taking differently: Take 100 mg by mouth as needed for mild constipation.)   escitalopram (LEXAPRO) 20 MG tablet Take 20 mg by mouth at bedtime.    fluticasone (FLONASE) 50 MCG/ACT nasal spray Place 1 spray into both nostrils daily as needed for allergies.    Fluticasone-Umeclidin-Vilant (TRELEGY ELLIPTA) 100-62.5-25 MCG/INH AEPB Inhale 1 puff into the lungs daily.    hydrochlorothiazide (HYDRODIURIL) 25 MG  tablet Take 25 mg by mouth daily.   levothyroxine (SYNTHROID) 88 MCG tablet Take 1 tablet (88 mcg total) by mouth daily.   lisinopril (ZESTRIL) 10 MG tablet Take 10 mg by mouth daily.   Multiple Vitamins-Minerals (BARIATRIC MULTIVITAMINS/IRON PO) Take 1 tablet by mouth daily.  nitroGLYCERIN (NITROSTAT) 0.4 MG SL tablet Place 1 tablet (0.4 mg total) under the tongue every 5 (five) minutes as needed for chest pain.   nystatin (MYCOSTATIN/NYSTOP) powder Apply 1 application topically 2 (two) times daily as needed for dry skin (dry skin).   omeprazole (PRILOSEC) 40 MG capsule Take 1 capsule by mouth daily.   oxycodone-acetaminophen (LYNOX) 10-300 MG tablet Take 1 tablet by mouth every 6 (six) hours as needed for pain.   pregabalin (LYRICA) 150 MG capsule Take 150 mg by mouth 3 (three) times daily.   REPATHA 140 MG/ML SOSY INJECT 140MG  SUBCUTANEOUSLY EVERY 2 WEEKS   terconazole (TERAZOL 7) 0.4 % vaginal cream Place 1 applicator vaginally at bedtime as needed for other (yeast infections). Yeast infection    topiramate (TOPAMAX) 25 MG tablet Take 25 mg by mouth at bedtime.   Vitamin D, Ergocalciferol, (DRISDOL) 1.25 MG (50000 UT) CAPS capsule Take 50,000 Units by mouth every Saturday.     Allergies:   Macrodantin [nitrofurantoin macrocrystal], Metformin and related, Atorvastatin, Hydromorphone hcl, Lipitor [atorvastatin calcium], Meperidine hcl, and Tetracyclines & related   Social History   Socioeconomic History   Marital status: Widowed    Spouse name: Not on file   Number of children: 4   Years of education: Not on file   Highest education level: Not on file  Occupational History   Occupation: CSR    Employer: [employer]  Tobacco Use   Smoking status: Former    Types: Cigarettes    Quit date: 06/15/2019    Years since quitting: 2.3   Smokeless tobacco: Never  Vaping Use   Vaping Use: Never used  Substance and Sexual Activity   Alcohol use: No    Alcohol/week: 0.0 standard drinks    Drug use: No   Sexual activity: Yes    Birth control/protection: Post-menopausal  Other Topics Concern   Not on file  Social History Narrative   Not on file   Social Determinants of Health   Financial Resource Strain: Not on file  Food Insecurity: Not on file  Transportation Needs: Not on file  Physical Activity: Not on file  Stress: Not on file  Social Connections: Not on file     Family History: The patient's family history includes Heart failure in her father. There is no history of Colon cancer.  ROS:   Please see the history of present illness.    All other systems reviewed and are negative.  EKGs/Labs/Other Studies Reviewed:    The following studies were reviewed today: LEFT HEART CATH AND CORONARY ANGIOGRAPHY   Conclusion  Conclusions: Mild-moderate, nonobstructive coronary artery disease.  Ostial D1 and mid LAD disease has progressed slightly compared to prior catheterization in 2020 with up to 40-50% stenosis now present. Grossly normal left ventricular systolic function (suboptimal opacification of the left ventricle with hand-injection) with mildly elevated filling pressure (LVEDP 21 mmHg).   Recommendations: Continue medical therapy and risk factor modification. Consider echocardiogram for further assessment of LVEF and other potential causes for the patient's symptoms.   2021, MD [facility]   Recent Labs: 07/03/2021: Hemoglobin 13.4; Platelets 217.0 08/28/2021: BUN 16; Creatinine, Ser 0.91; Potassium 4.1; Sodium 135; TSH 0.69  Recent Lipid Panel    Component Value Date/Time   CHOL 105 08/26/2020 1059   CHOL 179 01/07/2019 1019   TRIG 74.0 08/26/2020 1059   HDL 44.60 08/26/2020 1059   HDL 34 (L) 01/07/2019 1019   CHOLHDL 2 08/26/2020 1059   VLDL 14.8 08/26/2020  1059   LDLCALC 45 08/26/2020 1059   LDLCALC 123 (H) 01/07/2019 1019   LDLDIRECT 179.5 03/14/2009 1147    Physical Exam:    VS:  BP 116/74   Pulse 62   Ht 5' 7.6" (1.717  m)   Wt 245 lb (111.1 kg)   LMP  (LMP Unknown)   SpO2 96%   BMI 37.69 kg/m     Wt Readings from Last 3 Encounters:  10/30/21 245 lb (111.1 kg)  07/03/21 248 lb (112.5 kg)  03/23/21 248 lb 9.6 oz (112.8 kg)     GEN: Patient is in no acute distress HEENT: Normal NECK: No JVD; No carotid bruits LYMPHATICS: No lymphadenopathy CARDIAC: Hear sounds regular, 2/6 systolic murmur at the apex. RESPIRATORY:  Clear to auscultation without rales, wheezing or rhonchi  ABDOMEN: Soft, non-tender, non-distended MUSCULOSKELETAL:  No edema; No deformity  SKIN: Warm and dry NEUROLOGIC:  Alert and oriented x 3 PSYCHIATRIC:  Normal affect   Signed, Garwin Brothersajan R Sania Noy, MD  10/30/2021 1:32 PM    [facility]

## 2021-11-01 ENCOUNTER — Ambulatory Visit: Payer: Medicare HMO | Admitting: Cardiology

## 2021-11-16 ENCOUNTER — Other Ambulatory Visit: Payer: Self-pay | Admitting: Internal Medicine

## 2021-12-03 ENCOUNTER — Other Ambulatory Visit: Payer: Self-pay | Admitting: Internal Medicine

## 2021-12-28 ENCOUNTER — Telehealth: Payer: Self-pay | Admitting: *Deleted

## 2021-12-28 NOTE — Telephone Encounter (Signed)
Patient is calling to ask if it is protocol before surgery to give tetanus shots if patient has not had one in 10 years. She was bitten by her dog on foot today. She had foot surgery in Feb.Please advise.

## 2021-12-28 NOTE — Telephone Encounter (Signed)
Did explain to patient to f/u w/ PCP for tetanus shot, and that tenanus shot before surgery is not a protocol,verbalized understanding.

## 2022-01-01 ENCOUNTER — Encounter: Payer: Self-pay | Admitting: Podiatry

## 2022-01-01 ENCOUNTER — Ambulatory Visit (INDEPENDENT_AMBULATORY_CARE_PROVIDER_SITE_OTHER): Payer: Medicare Other | Admitting: Podiatry

## 2022-01-01 DIAGNOSIS — E0800 Diabetes mellitus due to underlying condition with hyperosmolarity without nonketotic hyperglycemic-hyperosmolar coma (NKHHC): Secondary | ICD-10-CM | POA: Diagnosis not present

## 2022-01-01 DIAGNOSIS — M79674 Pain in right toe(s): Secondary | ICD-10-CM

## 2022-01-01 DIAGNOSIS — Z794 Long term (current) use of insulin: Secondary | ICD-10-CM | POA: Diagnosis not present

## 2022-01-01 DIAGNOSIS — M79675 Pain in left toe(s): Secondary | ICD-10-CM

## 2022-01-01 DIAGNOSIS — B351 Tinea unguium: Secondary | ICD-10-CM | POA: Diagnosis not present

## 2022-01-01 NOTE — Progress Notes (Signed)
Subjective:  Patient ID: Marie Chen, female    DOB: 17-May-1961,   MRN: [id number]  No chief complaint on file.   61 y.o. female presents for concern of thickened elongated and painful nails that are difficult to trim. Requesting to have them trimmed today. Relates burning and tingling in their feet. Patient is diabetic and last A1c was  Lab Results  Component Value Date   HGBA1C 5.9 (A) 07/03/2021   .   PCP:  [name], [name]    . Denies any other pedal complaints. Denies n/v/f/c.   Past Medical History:  Diagnosis Date   Acute hypoxemic respiratory failure due to severe acute respiratory syndrome coronavirus 2 (SARS-CoV-2) disease (HCC) 06/18/2019   Acute pharyngitis 05/23/2009   Qualifier: Diagnosis of  By: Felicity Coyer MD, Vikki Ports A    Allergic rhinitis 03/26/2007   Qualifier: Diagnosis of  By: Jonny Ruiz MD, Len Blalock   Formatting of this note might be different from the original. Overview:  Qualifier: Diagnosis of  By: Jonny Ruiz MD, Len Blalock   Angina pectoris (HCC) 12/04/2018   Asthma    severe   ASTHMA NOS W/ACUTE EXACERBATION 03/26/2007   Qualifier: Diagnosis of  By: Jonny Ruiz MD, Len Blalock    Asthma with exacerbation 03/26/2007   Formatting of this note might be different from the original. Overview:  Qualifier: Diagnosis of  By: Jonny Ruiz MD, Len Blalock   ASTHMATIC BRONCHITIS, ACUTE 04/27/2008   Qualifier: Diagnosis of  By: Jonny Ruiz MD, Len Blalock    Bronchitis, chronic (HCC) 04/22/2008   Qualifier: Diagnosis of  By: Thurnell Lose of this note might be different from the original. Overview:  Qualifier: Diagnosis of  By: Alesia Richards   CAD (coronary artery disease) 01/01/2019   Carpal tunnel syndrome of left wrist 12/30/2019   Chronic obstructive pulmonary disease (HCC) 06/19/2019   Chronic pain syndrome 04/25/2017   Community acquired pneumonia 01/15/2020   Coronary artery disease    Cough with hemoptysis 05/11/2018   Depression    DEPRESSION 01/05/2007   Qualifier:  Diagnosis of  By: Jonny Ruiz MD, Len Blalock    Diabetes mellitus (HCC) 10/07/2020   Diabetes mellitus due to underlying condition with unspecified complications (HCC) 03/26/2007   Qualifier: Diagnosis of  By: Jonny Ruiz MD, Len Blalock    Diabetes mellitus in remission (HCC) 07/03/2021   Diabetes mellitus type 2 in obese (HCC) 05/12/2018   Diabetes mellitus without complication (HCC)    type 2   DSORD CIRCADIAN RHY SHFT WRK SLEEP PHASE TYPE 03/26/2007   Qualifier: Diagnosis of  By: Jonny Ruiz MD, Len Blalock   Formatting of this note might be different from the original. Overview:  Qualifier: Diagnosis of  By: Jonny Ruiz MD, Len Blalock   Dyslipidemia 10/19/2019   DYSPHAGIA 04/27/2008   Qualifier: Diagnosis of  By: Marina Goodell MD, Wilhemina Bonito    Dyspnea    with exertion   Essential hypertension 01/05/2007   Qualifier: Diagnosis of  By: Jonny Ruiz MD, Len Blalock    Fatigue 07/03/2021   Fatty liver    Fibromyalgia    GERD 01/05/2007   Qualifier: Diagnosis of  By: Jonny Ruiz MD, Len Blalock    GERD (gastroesophageal reflux disease)    HAMMER TOE 01/05/2007   Qualifier: Diagnosis of  By: Jonny Ruiz MD, Len Blalock    Headache 02/24/2009   Qualifier: Diagnosis of  By: Jonny Ruiz MD, Len Blalock   HEADACHE 02/24/2009   Qualifier: Diagnosis of  By: Jonny Ruiz MD, Len Blalock  Hemoptysis 05/11/2018   Hyperlipidemia 01/05/2007   diet controlled Formatting of this note might be different from the original. Overview:  Qualifier: Diagnosis of  By: Jonny Ruiz MD, Len Blalock   Hypertension    Hypothyroidism    Inflammatory arthritis 07/22/2020   IRRITABLE BOWEL SYNDROME, HX OF 03/26/2007   Qualifier: Diagnosis of  By: Jonny Ruiz MD, Len Blalock    Lumbar facet arthropathy 04/25/2017   Lumbar radiculopathy 09/19/2017   Migraine headache 04/22/2008   Formatting of this note might be different from the original. Overview:  Qualifier: Diagnosis of  By: Veryl Speak, CHRONIC 04/22/2008   Qualifier: Diagnosis of  By: Alesia Richards     Mixed dyslipidemia 01/05/2007   Qualifier:  Diagnosis of  By: Jonny Ruiz MD, Len Blalock    Morbid obesity (HCC) 12/04/2018   Morbid obesity with BMI of 50.0-59.9, adult (HCC) 09/23/2019   Numbness 12/04/2019   Old disruption of left lateral collateral ligament 12/04/2019   Osteoarthritis 07/22/2020   Pain from implanted hardware 08/01/2017   Pain in left shoulder 07/13/2020   Pain in unspecified knee 03/04/2017   PEPTIC ULCER DISEASE 03/26/2007   Qualifier: Diagnosis of  By: Jonny Ruiz MD, Len Blalock    PERSONAL HISTORY ENDOCRN METABOLIC&IMMUNITY D/O 04/22/2008   Qualifier: Diagnosis of  By: Kem Boroughs, Barbara     Plantar callus 08/01/2017   Plantar fasciitis 08/01/2017   Pre-operative cardiovascular examination 07/25/2020   Pre-ulcerative corn or callous 01/30/2018   Preop cardiovascular exam 01/22/2020   Primary osteoarthritis of both hips 04/25/2017   Primary osteoarthritis of both knees 04/25/2017   RESTLESS LEG SYNDROME, HX OF 03/26/2007   Qualifier: Diagnosis of  By: Jonny Ruiz MD, Len Blalock    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 Overview:  Qualifier: Diagnosis of  By: Jonny Ruiz MD, Len Blalock   Thoracic spine pain 11/13/2017   Thyroid disease    Trigger finger, left little finger 12/30/2019   Trochanteric bursitis of left hip 03/02/2018   Type 2 diabetes mellitus with diabetic polyneuropathy, with long-term current use of insulin (HCC) 01/26/2020   Type 2 diabetes mellitus with hyperglycemia, without long-term current use of insulin (HCC) 10/19/2019   Vitamin D deficiency, unspecified 08/25/2018    Objective:  Physical Exam: Vascular: DP/PT pulses 2/4 bilateral. CFT <3 seconds. Absent hair growth on  digits. Edema noted to bilateral lower extremities. Xerosis noted bilaterally.  Skin. No lacerations or abrasions bilateral feet. Nails 1-5 bilateral  are thickened discolored and elongated with subungual debris.  Musculoskeletal: MMT 5/5 bilateral lower extremities in DF, PF, Inversion and Eversion. Deceased ROM in DF of ankle joint.  Neurological: Sensation intact to light touch. Protective sensation diminished bilateral.    Assessment:   1. Pain due to onychomycosis of toenails of both feet   2. Diabetes mellitus due to underlying condition with hyperosmolarity without coma, with long-term current use of insulin (HCC)      Plan:  Patient was evaluated and treated and all questions answered. -Discussed and educated patient on diabetic foot care, especially with  regards to the vascular, neurological and  musculoskeletal systems.  -Stressed the importance of good glycemic control and the detriment of not  controlling glucose levels in relation to the foot. -Discussed supportive shoes at all times and checking feet regularly.  -Mechanically debrided all nails 1-5 bilateral using sterile nail nipper and filed with dremel without incident  -Answered all patient questions -Patient to return  in 3 months for at risk foot care -Patient advised to call the office if any problems or questions arise in the meantime.   Louann Sjogren, DPM

## 2022-01-02 NOTE — Progress Notes (Unsigned)
Name: Marie Chen L Suminski  Age/ Sex: 61 y.o., female   MRN/ DOB: [id number], 03/19/1961     PCP: Claybon JabsBrown, Emily, [name]   Reason for Endocrinology Evaluation: Type 2 Diabetes Mellitus  Initial Endocrine Consultative Visit: 10/19/2019    PATIENT IDENTIFIER: [name] Marie Chen is a 61 y.o. 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 The patient has followed with Endocrinology clinic since 10/19/2019 for consultative assistance with management of her diabetes.  DIABETIC HISTORY:  [name] was diagnosed with DM in 2015. Pt endorses genital skin irritation secondary to Metformin ?Marcelline Deist. Farxiga caused genital infections. Her hemoglobin A1c has ranged from 8.8% in 03/2019 , peaking at 10.5% in 2021.  On her initial visit to  Our clinic her A1c 9.0% . We adjust MDI regimen    S/P sleeve gastrectomy 10/03/2020, her A1c was 5.4 % and we stopped insulin      THYROID HISTORY:  In 1994 she had RAI ablation secondary to hyperthyroidism. She needed LT-4 replacement shortly after RAI ablation .    Mother with cushing syndrome     SUBJECTIVE:     During the last visit (07/03/2021): A1c 6.0 % . Remained off glycemic agents    Today (01/03/2022): [name] is here for a follow up on diabetes management.  She checks her blood sugars weekly.    S/P gastric sleeve sx 10/03/2020 She was seen by podiatry yesterday for onychomycosis She was also seen by cardiology in May 2023 for CAD Weight stable  She continues with fatigue  She has narcolepsy and follows with pulmonology  She has LE edema     HOME ENDOCRINE REGIMEN:  Levothyroxine 88 mcg daily  Bariatric Vitamin  ( iron: 45 , B12 1000 mcg , D: 3000 iu    Statin: yes ACE-I/ARB: yes    METER DOWNLOAD SUMMARY: Unable to download   81- 150 mg/dL     DIABETIC COMPLICATIONS: Microvascular complications:   Neuropathy  Denies: CKD, retinopathy  Last eye exam: scheduled next month    Macrovascular complications:  CAD ( medically treated per pt)  Denies: CAD, PVD, CVA   HISTORY:  Past Medical History:  Past Medical History:  Diagnosis Date   Acute hypoxemic respiratory failure due to severe acute respiratory syndrome coronavirus 2 (SARS-CoV-2) disease (HCC) 06/18/2019   Acute pharyngitis 05/23/2009   Qualifier: Diagnosis of  By: Felicity CoyerLeschber MD, Vikki PortsValerie A    Allergic rhinitis 03/26/2007   Qualifier: Diagnosis of  By: Jonny RuizJohn MD, Len BlalockJames W   Formatting of this note might be different from the original. Overview:  Qualifier: Diagnosis of  By: Jonny RuizJohn MD, Len BlalockJames W   Angina pectoris (HCC) 12/04/2018   Asthma    severe   ASTHMA NOS W/ACUTE EXACERBATION 03/26/2007   Qualifier: Diagnosis of  By: Jonny RuizJohn MD, Len BlalockJames W    Asthma with exacerbation 03/26/2007   Formatting of this note might be different from the original. Overview:  Qualifier: Diagnosis of  By: Jonny RuizJohn MD, Len BlalockJames W   ASTHMATIC BRONCHITIS, ACUTE 04/27/2008   Qualifier: Diagnosis of  By: Jonny RuizJohn MD, Len BlalockJames W    Bronchitis, chronic (HCC) 04/22/2008   Qualifier: Diagnosis of  By: Thurnell LoseSmith NCMA, Barbara    Formatting of this note might be different from the original. Overview:  Qualifier: Diagnosis of  By: Alesia RichardsSmith NCMA, Barbara   CAD (coronary artery disease) 01/01/2019   Carpal tunnel syndrome of left  wrist 12/30/2019   Chronic obstructive pulmonary disease (HCC) 06/19/2019   Chronic pain syndrome 04/25/2017   Community acquired pneumonia 01/15/2020   Coronary artery disease    Cough with hemoptysis 05/11/2018   Depression    DEPRESSION 01/05/2007   Qualifier: Diagnosis of  By: Jonny Ruiz MD, Len Blalock    Diabetes mellitus (HCC) 10/07/2020   Diabetes mellitus due to underlying condition with unspecified complications (HCC) 03/26/2007   Qualifier: Diagnosis of  By: Jonny Ruiz MD, Len Blalock    Diabetes mellitus in remission (HCC) 07/03/2021   Diabetes mellitus type 2 in obese  (HCC) 05/12/2018   Diabetes mellitus without complication (HCC)    type 2   DSORD CIRCADIAN RHY SHFT WRK SLEEP PHASE TYPE 03/26/2007   Qualifier: Diagnosis of  By: Jonny Ruiz MD, Len Blalock   Formatting of this note might be different from the original. Overview:  Qualifier: Diagnosis of  By: Jonny Ruiz MD, Len Blalock   Dyslipidemia 10/19/2019   DYSPHAGIA 04/27/2008   Qualifier: Diagnosis of  By: Marina Goodell MD, Wilhemina Bonito    Dyspnea    with exertion   Essential hypertension 01/05/2007   Qualifier: Diagnosis of  By: Jonny Ruiz MD, Len Blalock    Fatigue 07/03/2021   Fatty liver    Fibromyalgia    GERD 01/05/2007   Qualifier: Diagnosis of  By: Jonny Ruiz MD, Len Blalock    GERD (gastroesophageal reflux disease)    HAMMER TOE 01/05/2007   Qualifier: Diagnosis of  By: Jonny Ruiz MD, Len Blalock    Headache 02/24/2009   Qualifier: Diagnosis of  By: Jonny Ruiz MD, Len Blalock   HEADACHE 02/24/2009   Qualifier: Diagnosis of  By: Jonny Ruiz MD, Len Blalock    Hemoptysis 05/11/2018   Hyperlipidemia 01/05/2007   diet controlled Formatting of this note might be different from the original. Overview:  Qualifier: Diagnosis of  By: Jonny Ruiz MD, Len Blalock   Hypertension    Hypothyroidism    Inflammatory arthritis 07/22/2020   IRRITABLE BOWEL SYNDROME, HX OF 03/26/2007   Qualifier: Diagnosis of  By: Jonny Ruiz MD, Len Blalock    Lumbar facet arthropathy 04/25/2017   Lumbar radiculopathy 09/19/2017   Migraine headache 04/22/2008   Formatting of this note might be different from the original. Overview:  Qualifier: Diagnosis of  By: Veryl Speak, CHRONIC 04/22/2008   Qualifier: Diagnosis of  By: Alesia Richards     Mixed dyslipidemia 01/05/2007   Qualifier: Diagnosis of  By: Jonny Ruiz MD, Len Blalock    Morbid obesity (HCC) 12/04/2018   Morbid obesity with BMI of 50.0-59.9, adult (HCC) 09/23/2019   Numbness 12/04/2019   Old disruption of left lateral collateral ligament 12/04/2019   Osteoarthritis 07/22/2020   Pain from implanted hardware 08/01/2017   Pain in left  shoulder 07/13/2020   Pain in unspecified knee 03/04/2017   PEPTIC ULCER DISEASE 03/26/2007   Qualifier: Diagnosis of  By: Jonny Ruiz MD, Len Blalock    PERSONAL HISTORY ENDOCRN METABOLIC&IMMUNITY D/O 04/22/2008   Qualifier: Diagnosis of  By: Kem Boroughs, Barbara     Plantar callus 08/01/2017   Plantar fasciitis 08/01/2017   Pre-operative cardiovascular examination 07/25/2020   Pre-ulcerative corn or callous 01/30/2018   Preop cardiovascular exam 01/22/2020   Primary osteoarthritis of both hips 04/25/2017   Primary osteoarthritis of both knees 04/25/2017   RESTLESS LEG SYNDROME, HX OF 03/26/2007   Qualifier: Diagnosis of  By: Jonny Ruiz MD, Len Blalock    S/P laparoscopic sleeve gastrectomy 10/12/2020   Segmental dysfunction of  lumbar region 04/25/2017   SINUSITIS- ACUTE-NOS 02/24/2009   Qualifier: Diagnosis of  By: Jonny Ruiz MD, Len Blalock    Sjogren's disease [facility])    Sleep apnea    CPAP nightly   Sleep apnea, obstructive 01/05/2007   Qualifier: Diagnosis of  By: Jonny Ruiz MD, Len Blalock   Formatting of this note might be different from the original. Overview:  Qualifier: Diagnosis of  By: Jonny Ruiz MD, Len Blalock   Thoracic spine pain 11/13/2017   Thyroid disease    Trigger finger, left little finger 12/30/2019   Trochanteric bursitis of left hip 03/02/2018   Type 2 diabetes mellitus with diabetic polyneuropathy, with long-term current use of insulin (HCC) 01/26/2020   Type 2 diabetes mellitus with hyperglycemia, without long-term current use of insulin (HCC) 10/19/2019   Vitamin D deficiency, unspecified 08/25/2018   Past Surgical History:  Past Surgical History:  Procedure Laterality Date   ANKLE SURGERY  2010   BACK SURGERY     CARPAL TUNNEL RELEASE Bilateral    CHOLECYSTECTOMY     COLONOSCOPY  04/2014   FOOT SURGERY     bunion surgery bialteral   HYSTEROSCOPY     LEFT HEART CATH AND CORONARY ANGIOGRAPHY N/A 12/19/2018   Procedure: LEFT HEART CATH AND CORONARY ANGIOGRAPHY;  Surgeon: Yvonne Kendall, MD;  Location:  [facility];  Service: Cardiovascular;  Laterality: N/A;   LEFT HEART CATH AND CORONARY ANGIOGRAPHY N/A 02/16/2021   Procedure: LEFT HEART CATH AND CORONARY ANGIOGRAPHY;  Surgeon: Yvonne Kendall, MD;  Location: [facility];  Service: Cardiovascular;  Laterality: N/A;   LIGAMENT REPAIR Left 02/04/2020   Procedure: REPAIR RADIAL COLLATERAL LIGAMENT METACARPAL PHALANGEAL LEFT SMALL FINGER; POSSIBLE PINNING;  Surgeon: Cindee Salt, MD;  Location: [facility] OR;  Service: Orthopedics;  Laterality: Left;  AXILLARY BLOCK   RESECTION DISTAL CLAVICAL Right    STERIOD INJECTION Left 02/04/2020   Procedure: INJECTION LEFT CARPAL TUNNEL;  Surgeon: Cindee Salt, MD;  Location: [facility] OR;  Service: Orthopedics;  Laterality: Left;   TUBAL LIGATION     UPPER GI ENDOSCOPY  04/2014   WISDOM TOOTH EXTRACTION     Social History:  reports that she quit smoking about 2 years ago. Her smoking use included cigarettes. She has never used smokeless tobacco. She reports that she does not drink alcohol and does not use drugs. Family History:  Family History  Problem Relation Age of Onset   Heart failure Father    Colon cancer Neg Hx      HOME MEDICATIONS: Allergies as of 01/03/2022       Reactions   Macrodantin [nitrofurantoin Macrocrystal] Rash   Metformin And Related Other (See Comments)   Causes yeast infections   Atorvastatin Diarrhea   Hydromorphone Hcl Nausea And Vomiting   Lipitor [atorvastatin Calcium] Diarrhea   Meperidine Hcl Nausea Only   Is ok if given with Phenergan   Tetracyclines & Related Rash        Medication List        Accurate as of January 03, 2022 11:31 AM. If you have any questions, ask your nurse or doctor.          Accu-Chek Aviva Plus test strip Generic drug: glucose blood USE AS INSTRUCTED TO TEST BLOOD SUGAR UP TO 3 TIMES DAILY   albuterol (2.5 MG/3ML) 0.083% nebulizer solution Commonly known as: PROVENTIL Take 2.5 mg by nebulization every 6 (six) hours as needed for  wheezing or shortness of breath.   aspirin EC 81  MG tablet Take 1 tablet (81 mg total) by mouth daily.   BARIATRIC MULTIVITAMINS/IRON PO Take 1 tablet by mouth daily.   calcium carbonate 500 MG chewable tablet Commonly known as: TUMS - dosed in mg elemental calcium Chew 1 tablet by mouth 3 (three) times daily.   docusate sodium 100 MG capsule Commonly known as: Colace Take 1 capsule (100 mg total) by mouth 2 (two) times daily. What changed:  when to take this reasons to take this   DropSafe Alcohol Prep 70 % Pads USE AS DIRECTED   escitalopram 20 MG tablet Commonly known as: LEXAPRO Take 20 mg by mouth at bedtime.   fluticasone 50 MCG/ACT nasal spray Commonly known as: FLONASE Place 1 spray into both nostrils daily as needed for allergies.   hydrochlorothiazide 25 MG tablet Commonly known as: HYDRODIURIL Take 25 mg by mouth daily.   levothyroxine 88 MCG tablet Commonly known as: SYNTHROID Take 1 tablet (88 mcg total) by mouth daily.   lisinopril 10 MG tablet Commonly known as: ZESTRIL Take 10 mg by mouth daily.   LUBRICATING EYE DROPS OP Place 1 drop into both eyes daily as needed for dry eyes (dry eyes).   nitroGLYCERIN 0.4 MG SL tablet Commonly known as: NITROSTAT Place 1 tablet (0.4 mg total) under the tongue every 5 (five) minutes as needed for chest pain.   nystatin powder Commonly known as: MYCOSTATIN/NYSTOP Apply 1 application topically 2 (two) times daily as needed for dry skin (dry skin).   omeprazole 40 MG capsule Commonly known as: PRILOSEC Take 1 capsule by mouth daily.   oxycodone-acetaminophen 10-300 MG tablet Commonly known as: LYNOX Take 1 tablet by mouth every 6 (six) hours as needed for pain.   pregabalin 150 MG capsule Commonly known as: LYRICA Take 150 mg by mouth 3 (three) times daily.   Repatha 140 MG/ML Sosy Generic drug: Evolocumab INJECT 140MG  SUBCUTANEOUSLY EVERY 2 WEEKS   Sunosi 75 MG Tabs Generic drug: Solriamfetol  HCl Take 1 tablet by mouth every morning.   terconazole 0.4 % vaginal cream Commonly known as: TERAZOL 7 Place 1 applicator vaginally at bedtime as needed for other (yeast infections). Yeast infection   topiramate 25 MG tablet Commonly known as: TOPAMAX Take 25 mg by mouth at bedtime.   Trelegy Ellipta 100-62.5-25 MCG/ACT Aepb Generic drug: Fluticasone-Umeclidin-Vilant Inhale 1 puff into the lungs daily.   Vitamin D (Ergocalciferol) 1.25 MG (50000 UNIT) Caps capsule Commonly known as: DRISDOL Take 50,000 Units by mouth every Saturday.         OBJECTIVE:   Vital Signs: BP 122/70 (BP Location: Left Arm, Patient Position: Sitting, Cuff Size: Large)   Pulse 73   Ht 5' 7.6" (1.717 m)   Wt 248 lb (112.5 kg)   LMP  (LMP Unknown)   SpO2 95%   BMI 38.16 kg/m   Wt Readings from Last 3 Encounters:  01/03/22 248 lb (112.5 kg)  10/30/21 245 lb (111.1 kg)  07/03/21 248 lb (112.5 kg)     Exam: General: Pt appears well and is in NAD  Lungs: Clear with good BS bilat   Heart: RRR   Extremities: Trace  pretibial edema.   Neuro: MS is good with appropriate affect, pt is alert and Ox3      DATA REVIEWED:  Lab Results  Component Value Date   HGBA1C 6.1 (A) 01/03/2022   HGBA1C 5.9 (A) 07/03/2021   HGBA1C 6.0 (A) 12/30/2020    Latest Reference Range & Units 01/03/22 11:39  Total CHOL/HDL Ratio  2  Cholesterol 0 - 200 mg/dL 149  HDL Cholesterol >70.26 mg/dL 37.85  LDL (calc) 0 - 99 mg/dL 44  NonHDL  88.50  Triglycerides 0.0 - 149.0 mg/dL 27.7  VLDL 0.0 - 41.2 mg/dL 87.8    6/76/7209 TSH 4.70 uIU/m B12 409 Vitamin D 62.7    ASSESSMENT / PLAN / RECOMMENDATIONS:   1) Postablative Hypothyroidism :  - Pt with fatigue, TSH at the low end of normal, will reduce the dose as below - No local neck symptoms   Medication : Change levothyroxine 88 mcg, half a tablet on Sundays and 1 tablet the rest of the week     2) Type 2 Diabetes Mellitus, in remission, A1c 6.1  %   - S/P sleeve gastrectomy on 10/03/2020 .  - Has a history of pancreatitis , DPP-4 inhibitors and GLP-1 agonists are CONTRAINDICATED - She is intolerant to metformin and SGLT-2 inhibitors.  -Has been off all glycemic agents since 09/2020   3) Dyslipidemia :  -LDL has trended down from 962 mg/DL -She is intolerant to atorvastatin, simvastatin, rosuvastatin, and Zetia due to myalgias -Today its optimal at 44 mg/DL -Tolerating Repatha with no side effects   Medication Repatha 140 mg q. 14 days   4) LE edema :   - Pt with varcosie veins  - Discussed low carb diet and leg elevation    F/U in 6 months     Signed electronically by: Lyndle Herrlich, MD  [facility] Endocrinology  [facility] [address] [place] [place], [place] [address] Phone: [phone] FAX: [phone]   CC: [name], [name] [address] [place] [address] Phone: [phone]  Fax: [phone]  Return to Endocrinology clinic as below: Future Appointments  Date Time Provider Department Center  04/12/2022  3:45 PM Freddie Breech, DPM [facility]

## 2022-01-03 ENCOUNTER — Encounter: Payer: Self-pay | Admitting: Internal Medicine

## 2022-01-03 ENCOUNTER — Ambulatory Visit (INDEPENDENT_AMBULATORY_CARE_PROVIDER_SITE_OTHER): Payer: Medicare Other | Admitting: Internal Medicine

## 2022-01-03 VITALS — BP 122/70 | HR 73 | Ht 67.6 in | Wt 248.0 lb

## 2022-01-03 DIAGNOSIS — E785 Hyperlipidemia, unspecified: Secondary | ICD-10-CM

## 2022-01-03 DIAGNOSIS — E890 Postprocedural hypothyroidism: Secondary | ICD-10-CM | POA: Diagnosis not present

## 2022-01-03 DIAGNOSIS — R600 Localized edema: Secondary | ICD-10-CM | POA: Diagnosis not present

## 2022-01-03 DIAGNOSIS — E1165 Type 2 diabetes mellitus with hyperglycemia: Secondary | ICD-10-CM

## 2022-01-03 LAB — LIPID PANEL
Cholesterol: 116 mg/dL (ref 0–200)
HDL: 52.8 mg/dL (ref >39.00)
LDL Cholesterol: 44 mg/dL (ref 0–99)
NonHDL: 62.9
Total CHOL/HDL Ratio: 2
Triglycerides: 94 mg/dL (ref 0.0–149.0)
VLDL: 18.8 mg/dL (ref 0.0–40.0)

## 2022-01-03 LAB — POCT GLYCOSYLATED HEMOGLOBIN (HGB A1C): Hemoglobin A1C: 6.1 % — AB (ref 4.0–5.6)

## 2022-01-03 MED ORDER — REPATHA 140 MG/ML ~~LOC~~ SOSY: 140.0000 mg | PREFILLED_SYRINGE | SUBCUTANEOUS | 3 refills | Status: DC

## 2022-01-03 NOTE — Patient Instructions (Addendum)
Take Half a tablet of levothyroxine 88 mcg  on Sundays and 1 tablet Monday through Saturday

## 2022-02-14 ENCOUNTER — Other Ambulatory Visit: Payer: Self-pay

## 2022-02-14 DIAGNOSIS — E1165 Type 2 diabetes mellitus with hyperglycemia: Secondary | ICD-10-CM

## 2022-02-14 MED ORDER — LEVOTHYROXINE SODIUM 88 MCG PO TABS: ORAL_TABLET | ORAL | 3 refills | Status: DC

## 2022-04-12 ENCOUNTER — Ambulatory Visit: Payer: Medicare HMO | Admitting: Podiatry

## 2022-04-17 ENCOUNTER — Other Ambulatory Visit: Payer: Self-pay | Admitting: Cardiology

## 2022-04-17 ENCOUNTER — Ambulatory Visit (INDEPENDENT_AMBULATORY_CARE_PROVIDER_SITE_OTHER): Payer: Medicare HMO | Admitting: Podiatry

## 2022-04-17 ENCOUNTER — Other Ambulatory Visit: Payer: Self-pay | Admitting: Internal Medicine

## 2022-04-17 DIAGNOSIS — M79674 Pain in right toe(s): Secondary | ICD-10-CM | POA: Diagnosis not present

## 2022-04-17 DIAGNOSIS — B353 Tinea pedis: Secondary | ICD-10-CM

## 2022-04-17 DIAGNOSIS — M79675 Pain in left toe(s): Secondary | ICD-10-CM

## 2022-04-17 DIAGNOSIS — B351 Tinea unguium: Secondary | ICD-10-CM

## 2022-04-17 DIAGNOSIS — L84 Corns and callosities: Secondary | ICD-10-CM

## 2022-04-17 DIAGNOSIS — Z794 Long term (current) use of insulin: Secondary | ICD-10-CM

## 2022-04-17 MED ORDER — KETOCONAZOLE 2 % EX CREA: 1.0000 | TOPICAL_CREAM | Freq: Every day | CUTANEOUS | 0 refills | Status: AC

## 2022-04-17 NOTE — Progress Notes (Signed)
  Subjective:  Patient ID: PORCHA DEBLANC, female    DOB: 1961/06/05,  MRN: [id number]  Chief Complaint  Patient presents with   Nail Problem    DFC- Blood sugar this morning was 98. Latest A1C 57 (July 2023)    61 y.o. female presents with the above complaint. History confirmed with patient. Patient presenting with pain related to dystrophic thickened elongated nails. Patient is unable to trim own nails related to nail dystrophy and/or mobility issues. Patient does  have a history of T2DM. Patient does have callus present located at the bilateral plantar forefoot causing pain.   Objective:  Physical Exam: warm, good capillary refill nail exam onychomycosis of the toenails, onycholysis, and dystrophic nails DP pulses palpable, PT pulses palpable, and protective sensation diminished Left Foot:  Pain with palpation of nails due to elongation and dystrophic growth.  Hyperkeratotic lesion present Sub fifth metatarsal head and Sub first MPJ Right Foot: Pain with palpation of nails due to elongation and dystrophic growth.  Hyperkeratotic lesion present Sub fifth metatarsal head and Sub first MPJ  Assessment:   1. Pain due to onychomycosis of toenails of both feet   2. Tinea pedis of both feet   3. Pre-ulcerative calluses   4. Diabetes mellitus due to underlying condition with hyperosmolarity without coma, with long-term current use of insulin ([place])      Plan:  Patient was evaluated and treated and all questions answered.  #tinea pedis Discussed the etiology and treatment options for tinea pedis.  Discussed topical and oral treatment.  Recommended topical treatment with 2% ketoconazole cream.  This was sent to the patient's pharmacy.  Also discussed appropriate foot hygiene, use of antifungal spray such as Tinactin in shoes, as well as cleaning her foot surfaces such as showers and bathroom floors with bleach.  #Hyperkeratotic lesions/pre ulcerative calluses present bilateral plantar  fifth metatarsal head and bilateral plantar first metatarsal head All symptomatic hyperkeratoses x 4 separate lesions were safely debrided with a sterile #10 blade to patient's level of comfort without incident. We discussed preventative and palliative care of these lesions including supportive and accommodative shoegear, padding, prefabricated and custom molded accommodative orthoses, use of a pumice stone and lotions/creams daily.  #Onychomycosis with pain  -Nails palliatively debrided as below. -Educated on self-care  Procedure: Nail Debridement Rationale: Pain Type of Debridement: manual, sharp debridement. Instrumentation: Nail nipper, rotary burr. Number of Nails: 10  No follow-ups on file.         Everitt Amber, DPM [facility]

## 2022-05-08 ENCOUNTER — Encounter: Payer: Self-pay | Admitting: Internal Medicine

## 2022-05-09 MED ORDER — INSULIN LISPRO (1 UNIT DIAL) 100 UNIT/ML (KWIKPEN): PEN_INJECTOR | SUBCUTANEOUS | 11 refills | Status: AC

## 2022-05-09 MED ORDER — INSULIN PEN NEEDLE 32G X 4 MM MISC: 1.0000 | Freq: Three times a day (TID) | 3 refills | Status: AC

## 2022-05-11 ENCOUNTER — Other Ambulatory Visit (HOSPITAL_COMMUNITY): Payer: Self-pay

## 2022-05-11 ENCOUNTER — Telehealth: Payer: Self-pay

## 2022-05-11 NOTE — Telephone Encounter (Signed)
Pharmacy Patient Advocate Encounter   Received notification from [facility] that prior authorization for Insulin Lispro (1 Unit Dial) 100UNIT/ML pen-injectors is required/requested.  PA submitted on 05/11/2022 to [facility] via CoverMyMeds Key [id number]  Status is pending

## 2022-05-11 NOTE — Telephone Encounter (Signed)
Patient Advocate Encounter  Prior Authorization for Insulin Lispro (1 Unit Dial) 100UNIT/ML pen-injectors has been approved.    PA# [id number] Effective dates: 06/11/2021 through 06/11/2023

## 2022-05-24 ENCOUNTER — Other Ambulatory Visit (HOSPITAL_COMMUNITY): Payer: Self-pay

## 2022-06-20 ENCOUNTER — Other Ambulatory Visit: Payer: Self-pay

## 2022-06-20 MED ORDER — ACCU-CHEK FASTCLIX LANCETS MISC: 3 refills | Status: AC

## 2022-06-20 MED ORDER — ACCU-CHEK AVIVA PLUS VI STRP: ORAL_STRIP | 3 refills | Status: DC

## 2022-06-20 MED ORDER — ACCU-CHEK AVIVA PLUS W/DEVICE KIT: PACK | 0 refills | Status: AC

## 2022-06-20 MED ORDER — ONETOUCH VERIO VI STRP: ORAL_STRIP | 0 refills | Status: DC

## 2022-06-20 NOTE — Telephone Encounter (Signed)
Patient will stop by and pick up a sample of the Onetouch meter until she can get her meter from mail order.

## 2022-07-09 ENCOUNTER — Ambulatory Visit (INDEPENDENT_AMBULATORY_CARE_PROVIDER_SITE_OTHER): Payer: Medicare HMO | Admitting: Internal Medicine

## 2022-07-09 ENCOUNTER — Encounter: Payer: Self-pay | Admitting: Internal Medicine

## 2022-07-09 VITALS — BP 124/78 | HR 62 | Ht 67.6 in | Wt 260.0 lb

## 2022-07-09 DIAGNOSIS — E785 Hyperlipidemia, unspecified: Secondary | ICD-10-CM | POA: Diagnosis not present

## 2022-07-09 DIAGNOSIS — S20211A Contusion of right front wall of thorax, initial encounter: Secondary | ICD-10-CM | POA: Diagnosis not present

## 2022-07-09 DIAGNOSIS — E1142 Type 2 diabetes mellitus with diabetic polyneuropathy: Secondary | ICD-10-CM

## 2022-07-09 DIAGNOSIS — E890 Postprocedural hypothyroidism: Secondary | ICD-10-CM | POA: Diagnosis not present

## 2022-07-09 DIAGNOSIS — Z794 Long term (current) use of insulin: Secondary | ICD-10-CM

## 2022-07-09 DIAGNOSIS — E1159 Type 2 diabetes mellitus with other circulatory complications: Secondary | ICD-10-CM | POA: Diagnosis not present

## 2022-07-09 DIAGNOSIS — E1165 Type 2 diabetes mellitus with hyperglycemia: Secondary | ICD-10-CM

## 2022-07-09 LAB — COMPREHENSIVE METABOLIC PANEL
ALT: 17 U/L (ref 0–35)
AST: 27 U/L (ref 0–37)
Albumin: 3.9 g/dL (ref 3.5–5.2)
Alkaline Phosphatase: 71 U/L (ref 39–117)
BUN: 22 mg/dL (ref 6–23)
CO2: 31 mEq/L (ref 19–32)
Calcium: 9.3 mg/dL (ref 8.4–10.5)
Chloride: 103 mEq/L (ref 96–112)
Creatinine, Ser: 0.77 mg/dL (ref 0.40–1.20)
GFR: 82.11 mL/min (ref >60.00)
Glucose, Bld: 136 mg/dL — ABNORMAL HIGH (ref 70–99)
Potassium: 3.8 mEq/L (ref 3.5–5.1)
Sodium: 140 mEq/L (ref 135–145)
Total Bilirubin: 0.6 mg/dL (ref 0.2–1.2)
Total Protein: 6.5 g/dL (ref 6.0–8.3)

## 2022-07-09 LAB — LIPID PANEL
Cholesterol: 116 mg/dL (ref 0–200)
HDL: 50.2 mg/dL (ref >39.00)
LDL Cholesterol: 49 mg/dL (ref 0–99)
NonHDL: 65.75
Total CHOL/HDL Ratio: 2
Triglycerides: 82 mg/dL (ref 0.0–149.0)
VLDL: 16.4 mg/dL (ref 0.0–40.0)

## 2022-07-09 LAB — POCT GLYCOSYLATED HEMOGLOBIN (HGB A1C): Hemoglobin A1C: 5.8 % — AB (ref 4.0–5.6)

## 2022-07-09 LAB — TSH: TSH: 1.43 u[IU]/mL (ref 0.35–5.50)

## 2022-07-09 NOTE — Progress Notes (Signed)
Name: Marie Chen  Age/ Sex: 62 y.o., female   MRN/ DOB: [id number], 07-09-1960     PCP: [name], [name]   Reason for Endocrinology Evaluation: Type 2 Diabetes Mellitus  Initial Endocrine Consultative Visit: 10/19/2019    PATIENT IDENTIFIER: Marie Chen is a 62 y.o. 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 The patient has followed with Endocrinology clinic since 10/19/2019 for consultative assistance with management of her diabetes.  DIABETIC HISTORY:  Marie Chen was diagnosed with DM in 2015. Pt endorses genital skin irritation secondary to Metformin ?Marcelline Deist caused genital infections. Her hemoglobin A1c has ranged from 8.8% in 03/2019 , peaking at 10.5% in 2021.  On her initial visit to  Our clinic her A1c 9.0% . We adjust MDI regimen    S/P sleeve gastrectomy 10/03/2020, her A1c was 5.4 % and we stopped insulin      THYROID HISTORY:  In 1994 she had RAI ablation secondary to hyperthyroidism. She needed LT-4 replacement shortly after RAI ablation .    Mother with cushing syndrome     SUBJECTIVE:     During the last visit (01/03/2022): A1c 6.0 % . Remained off glycemic agents    Today (07/09/2022): Marie Chen is here for a follow up on diabetes management.  She checks her blood sugars weekly.    S/P gastric sleeve sx 10/03/2020 She is followed by podiatry  for onychomycosis 04/2022 She was also seen by cardiology in May 2023 for CAD She continues to follow up pulmonary for narcolepsy and sleep testing  Denies  palpitations Has tremors  Denies loose stools or diarrhea  She had a fall last week and has right rib pain , she is on oxycodone     HOME ENDOCRINE REGIMEN:  Levothyroxine 88 mcg , half a tablet on Sundays and 1 tab rest of the week  Repatha 140 mg Q 14 days       Statin: yes ACE-I/ARB: yes    METER  DOWNLOAD SUMMARY: Unable to download   108 -159 mg/dL     DIABETIC COMPLICATIONS: Microvascular complications:  Neuropathy  Denies: CKD, retinopathy  Last eye exam: scheduled next month    Macrovascular complications:  CAD ( medically treated per pt)  Denies: CAD, PVD, CVA   HISTORY:  Past Medical History:  Past Medical History:  Diagnosis Date   Acute hypoxemic respiratory failure due to severe acute respiratory syndrome coronavirus 2 (SARS-CoV-2) disease (HCC) 06/18/2019   Acute pharyngitis 05/23/2009   Qualifier: Diagnosis of  By: Felicity Coyer MD, Vikki Ports A    Allergic rhinitis 03/26/2007   Qualifier: Diagnosis of  By: Jonny Ruiz MD, Len Blalock   Formatting of this note might be different from the original. Overview:  Qualifier: Diagnosis of  By: Jonny Ruiz MD, Len Blalock   Angina pectoris (HCC) 12/04/2018   Asthma    severe   ASTHMA NOS W/ACUTE EXACERBATION 03/26/2007   Qualifier: Diagnosis of  By: Jonny Ruiz MD, Len Blalock    Asthma with exacerbation 03/26/2007   Formatting of this note might be different from the original. Overview:  Qualifier: Diagnosis of  By: Jonny Ruiz MD, Len Blalock   ASTHMATIC BRONCHITIS, ACUTE 04/27/2008   Qualifier: Diagnosis of  By: Jonny Ruiz MD, Len Blalock    Bronchitis, chronic (HCC) 04/22/2008   Qualifier: Diagnosis of  By: Thurnell Lose of this note might be  different from the original. Overview:  Qualifier: Diagnosis of  By: Kem Boroughs, Britta Mccreedy   CAD (coronary artery disease) 01/01/2019   Carpal tunnel syndrome of left wrist 12/30/2019   Chronic obstructive pulmonary disease (HCC) 06/19/2019   Chronic pain syndrome 04/25/2017   Community acquired pneumonia 01/15/2020   Coronary artery disease    Cough with hemoptysis 05/11/2018   Depression    DEPRESSION 01/05/2007   Qualifier: Diagnosis of  By: Jonny Ruiz MD, Len Blalock    Diabetes mellitus (HCC) 10/07/2020   Diabetes mellitus due to underlying condition with unspecified complications (HCC) 03/26/2007   Qualifier:  Diagnosis of  By: Jonny Ruiz MD, Len Blalock    Diabetes mellitus in remission (HCC) 07/03/2021   Diabetes mellitus type 2 in obese (HCC) 05/12/2018   Diabetes mellitus without complication (HCC)    type 2   DSORD CIRCADIAN RHY SHFT WRK SLEEP PHASE TYPE 03/26/2007   Qualifier: Diagnosis of  By: Jonny Ruiz MD, Len Blalock   Formatting of this note might be different from the original. Overview:  Qualifier: Diagnosis of  By: Jonny Ruiz MD, Len Blalock   Dyslipidemia 10/19/2019   DYSPHAGIA 04/27/2008   Qualifier: Diagnosis of  By: Marina Goodell MD, Wilhemina Bonito    Dyspnea    with exertion   Essential hypertension 01/05/2007   Qualifier: Diagnosis of  By: Jonny Ruiz MD, Len Blalock    Fatigue 07/03/2021   Fatty liver    Fibromyalgia    GERD 01/05/2007   Qualifier: Diagnosis of  By: Jonny Ruiz MD, Len Blalock    GERD (gastroesophageal reflux disease)    HAMMER TOE 01/05/2007   Qualifier: Diagnosis of  By: Jonny Ruiz MD, Len Blalock    Headache 02/24/2009   Qualifier: Diagnosis of  By: Jonny Ruiz MD, Len Blalock   HEADACHE 02/24/2009   Qualifier: Diagnosis of  By: Jonny Ruiz MD, Len Blalock    Hemoptysis 05/11/2018   Hyperlipidemia 01/05/2007   diet controlled Formatting of this note might be different from the original. Overview:  Qualifier: Diagnosis of  By: Jonny Ruiz MD, Len Blalock   Hypertension    Hypothyroidism    Inflammatory arthritis 07/22/2020   IRRITABLE BOWEL SYNDROME, HX OF 03/26/2007   Qualifier: Diagnosis of  By: Jonny Ruiz MD, Len Blalock    Lumbar facet arthropathy 04/25/2017   Lumbar radiculopathy 09/19/2017   Migraine headache 04/22/2008   Formatting of this note might be different from the original. Overview:  Qualifier: Diagnosis of  By: Veryl Speak, CHRONIC 04/22/2008   Qualifier: Diagnosis of  By: Alesia Richards     Mixed dyslipidemia 01/05/2007   Qualifier: Diagnosis of  By: Jonny Ruiz MD, Len Blalock    Morbid obesity (HCC) 12/04/2018   Morbid obesity with BMI of 50.0-59.9, adult (HCC) 09/23/2019   Numbness 12/04/2019   Old disruption of left lateral  collateral ligament 12/04/2019   Osteoarthritis 07/22/2020   Pain from implanted hardware 08/01/2017   Pain in left shoulder 07/13/2020   Pain in unspecified knee 03/04/2017   PEPTIC ULCER DISEASE 03/26/2007   Qualifier: Diagnosis of  By: Jonny Ruiz MD, Len Blalock    PERSONAL HISTORY ENDOCRN METABOLIC&IMMUNITY D/O 04/22/2008   Qualifier: Diagnosis of  By: Kem Boroughs, Barbara     Plantar callus 08/01/2017   Plantar fasciitis 08/01/2017   Pre-operative cardiovascular examination 07/25/2020   Pre-ulcerative corn or callous 01/30/2018   Preop cardiovascular exam 01/22/2020   Primary osteoarthritis of both hips 04/25/2017   Primary osteoarthritis of both knees 04/25/2017   RESTLESS LEG  SYNDROME, HX OF 03/26/2007   Qualifier: Diagnosis of  By: Jonny Ruiz MD, Len Blalock    S/P laparoscopic sleeve gastrectomy 10/12/2020   Segmental dysfunction of lumbar region 04/25/2017   SINUSITIS- ACUTE-NOS 02/24/2009   Qualifier: Diagnosis of  By: Jonny Ruiz MD, Len Blalock    Sjogren's disease [facility])    Sleep apnea    CPAP nightly   Sleep apnea, obstructive 01/05/2007   Qualifier: Diagnosis of  By: Jonny Ruiz MD, Len Blalock   Formatting of this note might be different from the original. Overview:  Qualifier: Diagnosis of  By: Jonny Ruiz MD, Len Blalock   Thoracic spine pain 11/13/2017   Thyroid disease    Trigger finger, left little finger 12/30/2019   Trochanteric bursitis of left hip 03/02/2018   Type 2 diabetes mellitus with diabetic polyneuropathy, with long-term current use of insulin (HCC) 01/26/2020   Type 2 diabetes mellitus with hyperglycemia, without long-term current use of insulin (HCC) 10/19/2019   Vitamin D deficiency, unspecified 08/25/2018   Past Surgical History:  Past Surgical History:  Procedure Laterality Date   ANKLE SURGERY  2010   BACK SURGERY     CARPAL TUNNEL RELEASE Bilateral    CHOLECYSTECTOMY     COLONOSCOPY  04/2014   FOOT SURGERY     bunion surgery bialteral   HYSTEROSCOPY     LEFT HEART CATH AND CORONARY  ANGIOGRAPHY N/A 12/19/2018   Procedure: LEFT HEART CATH AND CORONARY ANGIOGRAPHY;  Surgeon: Yvonne Kendall, MD;  Location: [facility];  Service: Cardiovascular;  Laterality: N/A;   LEFT HEART CATH AND CORONARY ANGIOGRAPHY N/A 02/16/2021   Procedure: LEFT HEART CATH AND CORONARY ANGIOGRAPHY;  Surgeon: Yvonne Kendall, MD;  Location: [facility];  Service: Cardiovascular;  Laterality: N/A;   LIGAMENT REPAIR Left 02/04/2020   Procedure: REPAIR RADIAL COLLATERAL LIGAMENT METACARPAL PHALANGEAL LEFT SMALL FINGER; POSSIBLE PINNING;  Surgeon: Cindee Salt, MD;  Location: [facility] OR;  Service: Orthopedics;  Laterality: Left;  AXILLARY BLOCK   RESECTION DISTAL CLAVICAL Right    STERIOD INJECTION Left 02/04/2020   Procedure: INJECTION LEFT CARPAL TUNNEL;  Surgeon: Cindee Salt, MD;  Location: [facility] OR;  Service: Orthopedics;  Laterality: Left;   TUBAL LIGATION     UPPER GI ENDOSCOPY  04/2014   WISDOM TOOTH EXTRACTION     Social History:  reports that she quit smoking about 3 years ago. Her smoking use included cigarettes. She has never used smokeless tobacco. She reports that she does not drink alcohol and does not use drugs. Family History:  Family History  Problem Relation Age of Onset   Heart failure Father    Colon cancer Neg Hx      HOME MEDICATIONS: Allergies as of 07/09/2022       Reactions   Macrodantin [nitrofurantoin Macrocrystal] Rash   Metformin And Related Other (See Comments)   Causes yeast infections   Atorvastatin Diarrhea   Hydromorphone Hcl Nausea And Vomiting   Lipitor [atorvastatin Calcium] Diarrhea   Meperidine Hcl Nausea Only   Is ok if given with Phenergan   Tetracyclines & Related Rash        Medication List        Accurate as of July 09, 2022 10:47 AM. If you have any questions, ask your nurse or doctor.          Accu-Chek Aviva Plus test strip Generic drug: glucose blood USE AS INSTRUCTED TO TEST BLOOD SUGAR UP TO 3 TIMES DAILY   OneTouch Verio  test strip  Generic drug: glucose blood Check blood sugar 3 times daily   Accu-Chek Aviva Plus w/Device Kit Check blood sugar 3 times daily   Accu-Chek FastClix Lancets Misc Check blood sugars 3 times daily   albuterol (2.5 MG/3ML) 0.083% nebulizer solution Commonly known as: PROVENTIL Take 2.5 mg by nebulization every 6 (six) hours as needed for wheezing or shortness of breath.   aspirin EC 81 MG tablet Take 1 tablet (81 mg total) by mouth daily.   BARIATRIC MULTIVITAMINS/IRON PO Take 1 tablet by mouth daily.   calcium carbonate 500 MG chewable tablet Commonly known as: TUMS - dosed in mg elemental calcium Chew 1 tablet by mouth 3 (three) times daily.   docusate sodium 100 MG capsule Commonly known as: Colace Take 1 capsule (100 mg total) by mouth 2 (two) times daily. What changed:  when to take this reasons to take this   DropSafe Alcohol Prep 70 % Pads USE AS DIRECTED   escitalopram 20 MG tablet Commonly known as: LEXAPRO Take 20 mg by mouth at bedtime.   fluticasone 50 MCG/ACT nasal spray Commonly known as: FLONASE Place 1 spray into both nostrils daily as needed for allergies.   hydrochlorothiazide 25 MG tablet Commonly known as: HYDRODIURIL Take 25 mg by mouth daily.   insulin lispro 100 UNIT/ML KwikPen Commonly known as: HumaLOG KwikPen Max daily 15 units   Insulin Pen Needle 32G X 4 MM Misc 1 Device by Does not apply route 3 (three) times daily.   ketoconazole 2 % cream Commonly known as: NIZORAL Apply 1 Application topically daily.   levothyroxine 88 MCG tablet Commonly known as: SYNTHROID Take Half a tablet of levothyroxine 88 mcg  on Sundays and 1 tablet Monday through Saturday   lisinopril 10 MG tablet Commonly known as: ZESTRIL Take 10 mg by mouth daily.   LUBRICATING EYE DROPS OP Place 1 drop into both eyes daily as needed for dry eyes (dry eyes).   nitroGLYCERIN 0.4 MG SL tablet Commonly known as: NITROSTAT Place 1 tablet (0.4 mg  total) under the tongue every 5 (five) minutes as needed for chest pain.   nystatin powder Commonly known as: MYCOSTATIN/NYSTOP Apply 1 application topically 2 (two) times daily as needed for dry skin (dry skin).   omeprazole 40 MG capsule Commonly known as: PRILOSEC Take 1 capsule by mouth daily.   oxycodone-acetaminophen 10-300 MG tablet Commonly known as: LYNOX Take 1 tablet by mouth every 6 (six) hours as needed for pain.   pregabalin 150 MG capsule Commonly known as: LYRICA Take 150 mg by mouth 3 (three) times daily.   Repatha 140 MG/ML Sosy Generic drug: Evolocumab 140 mg by Other route every 14 (fourteen) days.   Sunosi 75 MG Tabs Generic drug: Solriamfetol HCl Take 1 tablet by mouth every morning.   terconazole 0.4 % vaginal cream Commonly known as: TERAZOL 7 Place 1 applicator vaginally at bedtime as needed for other (yeast infections). Yeast infection   topiramate 25 MG tablet Commonly known as: TOPAMAX Take 25 mg by mouth at bedtime.   Trelegy Ellipta 100-62.5-25 MCG/ACT Aepb Generic drug: Fluticasone-Umeclidin-Vilant Inhale 1 puff into the lungs daily.   Vitamin D (Ergocalciferol) 1.25 MG (50000 UNIT) Caps capsule Commonly known as: DRISDOL Take 50,000 Units by mouth every Saturday.         OBJECTIVE:   Vital Signs: BP 124/78 (BP Location: Left Arm, Patient Position: Sitting, Cuff Size: Large)   Pulse 62   Ht 5' 7.6" (1.717 m)   Wt  260 lb (117.9 kg)   LMP  (LMP Unknown)   SpO2 96%   BMI 40.00 kg/m   Wt Readings from Last 3 Encounters:  07/09/22 260 lb (117.9 kg)  01/03/22 248 lb (112.5 kg)  10/30/21 245 lb (111.1 kg)     Exam: General: Pt appears well and is in NAD  Lungs: Clear with good BS bilat   Heart: RRR   Extremities: Trace  pretibial edema.   Neuro: MS is good with appropriate affect, pt is alert and Ox3      DATA REVIEWED:  Lab Results  Component Value Date   HGBA1C 5.8 (A) 07/09/2022   HGBA1C 6.1 (A) 01/03/2022    HGBA1C 5.9 (A) 07/03/2021    Latest Reference Range & Units 07/09/22 11:05  Sodium 135 - 145 mEq/L 140  Potassium 3.5 - 5.1 mEq/L 3.8  Chloride 96 - 112 mEq/L 103  CO2 19 - 32 mEq/L 31  Glucose 70 - 99 mg/dL 161 (H)  BUN 6 - 23 mg/dL 22  Creatinine 0.96 - 0.45 mg/dL 4.09  Calcium 8.4 - 81.1 mg/dL 9.3  Alkaline Phosphatase 39 - 117 U/L 71  Albumin 3.5 - 5.2 g/dL 3.9  AST 0 - 37 U/L 27  ALT 0 - 35 U/L 17  Total Protein 6.0 - 8.3 g/dL 6.5  Total Bilirubin 0.2 - 1.2 mg/dL 0.6  GFR >91.47 mL/min 82.11    Latest Reference Range & Units 07/09/22 11:05  Total CHOL/HDL Ratio  2  Cholesterol 0 - 200 mg/dL 829  HDL Cholesterol >56.21 mg/dL 30.86  LDL (calc) 0 - 99 mg/dL 49  NonHDL  57.84  Triglycerides 0.0 - 149.0 mg/dL 69.6  VLDL 0.0 - 29.5 mg/dL 28.4    Latest Reference Range & Units 07/09/22 11:05  TSH 0.35 - 5.50 uIU/mL 1.43     ASSESSMENT / PLAN / RECOMMENDATIONS:   1) Postablative Hypothyroidism :  - No local neck symptoms -TSH normal -No change   Medication : Continue levothyroxine 88 mcg, half a tablet on Sundays and 1 tablet the rest of the week     2) Type 2 Diabetes Mellitus, in remission, A1c 5.8 %   - S/P sleeve gastrectomy on 10/03/2020 .  - Has a history of pancreatitis , DPP-4 inhibitors and GLP-1 agonists are CONTRAINDICATED - She is intolerant to metformin and SGLT-2 inhibitors.  -Has been off all glycemic agents since 09/2020, but likes to have a prescription for Humalog to be used for hyperglycemia  Medication  Humalog (BG-130/25)   3) Dyslipidemia :   -She is intolerant to atorvastatin, simvastatin, rosuvastatin, and Zetia due to myalgias -Tolerating Repatha with no side effects -LDL at goal   Medication Repatha 140 mg q. 14 days   4) Right rib contusion :  -She is already on pain medications through the pain clinic -I offered to order x-ray but she would wait until she sees the pain management clinic    F/U in 6 months      Signed electronically by: Lyndle Herrlich, MD  [facility] Endocr[facility]  [facility] [address] [place] [place], [place] [address] Phone: [phone] FAX: [phone]   CC: [name], [name] [address] [place] [address] Phone: [phone]  Fax: [phone]  Return to Endocrinology clinic as below: Future Appointments  Date Time Provider Department Center  07/17/2022 10:45 AM Stand[place], Jenelle Mages, DPM [facility]

## 2022-07-10 MED ORDER — REPATHA 140 MG/ML ~~LOC~~ SOSY: 140.0000 mg | PREFILLED_SYRINGE | SUBCUTANEOUS | 3 refills | Status: DC

## 2022-07-10 MED ORDER — LEVOTHYROXINE SODIUM 88 MCG PO TABS: ORAL_TABLET | ORAL | 3 refills | Status: DC

## 2022-07-17 ENCOUNTER — Ambulatory Visit: Payer: Medicare HMO | Admitting: Podiatry

## 2022-07-17 DIAGNOSIS — L84 Corns and callosities: Secondary | ICD-10-CM

## 2022-07-17 DIAGNOSIS — M79674 Pain in right toe(s): Secondary | ICD-10-CM | POA: Diagnosis not present

## 2022-07-17 DIAGNOSIS — M79675 Pain in left toe(s): Secondary | ICD-10-CM | POA: Diagnosis not present

## 2022-07-17 DIAGNOSIS — E0800 Diabetes mellitus due to underlying condition with hyperosmolarity without nonketotic hyperglycemic-hyperosmolar coma (NKHHC): Secondary | ICD-10-CM

## 2022-07-17 DIAGNOSIS — B351 Tinea unguium: Secondary | ICD-10-CM | POA: Diagnosis not present

## 2022-07-17 DIAGNOSIS — Z794 Long term (current) use of insulin: Secondary | ICD-10-CM

## 2022-07-17 NOTE — Progress Notes (Signed)
  Subjective:  Patient ID: Marie Chen, female    DOB: Jul 12, 1960,  MRN: [id number]  Chief Complaint  Patient presents with   Nail Problem    Diabetic Foot Care     62 y.o. female presents with the above complaint. History confirmed with patient. Patient presenting with pain related to dystrophic thickened elongated nails. Patient is unable to trim own nails related to nail dystrophy and/or mobility issues. Patient does have a history of T2DM. Patient does have callus present located at the bilateral hallux IPJ medially and the bilateral plantar 1st MPJ causing pain.   Objective:  Physical Exam: warm, good capillary refill nail exam onychomycosis of the toenails, onycholysis, and dystrophic nails DP pulses palpable, PT pulses palpable, and protective sensation intact Left Foot:  Pain with palpation of nails due to elongation and dystrophic growth.  Hyperkeratotic lesion at the medial aspect of hallux IPJ and plantar first MPJ with pain on these lesions with palpation but no underlying ulceration Right Foot: Pain with palpation of nails due to elongation and dystrophic growth. Hyperkeratotic lesion at the medial aspect of hallux IPJ and plantar first MPJ with pain on these lesions with palpation but no underlying ulceration  Assessment:   1. Pain due to onychomycosis of toenails of both feet   2. Pre-ulcerative calluses   3. Diabetes mellitus due to underlying condition with hyperosmolarity without coma, with long-term current use of insulin ([facility])      Plan:  Patient was evaluated and treated and all questions answered.  #Hyperkeratotic lesions/pre ulcerative calluses present bilateral hallux IPJ medially and bilateral hallux MPJ plantarly All symptomatic hyperkeratoses x 4 separate lesions were safely debrided with a sterile #10 blade to patient's level of comfort without incident. We discussed preventative and palliative care of these lesions including supportive and accommodative  shoegear, padding, prefabricated and custom molded accommodative orthoses, use of a pumice stone and lotions/creams daily.  #Onychomycosis with pain  -Nails palliatively debrided as below. -Educated on self-care  Procedure: Nail Debridement Rationale: Pain Type of Debridement: manual, sharp debridement. Instrumentation: Nail nipper, rotary burr. Number of Nails: 10  Return in about 3 months (around 10/15/2022) for [facility].         Everitt Amber, DPM [facility]

## 2022-10-15 ENCOUNTER — Ambulatory Visit: Payer: Medicare HMO | Admitting: Podiatry

## 2022-10-15 DIAGNOSIS — M79674 Pain in right toe(s): Secondary | ICD-10-CM

## 2022-10-15 DIAGNOSIS — B351 Tinea unguium: Secondary | ICD-10-CM

## 2022-10-15 DIAGNOSIS — E0800 Diabetes mellitus due to underlying condition with hyperosmolarity without nonketotic hyperglycemic-hyperosmolar coma (NKHHC): Secondary | ICD-10-CM

## 2022-10-15 DIAGNOSIS — L84 Corns and callosities: Secondary | ICD-10-CM

## 2022-10-15 DIAGNOSIS — M21619 Bunion of unspecified foot: Secondary | ICD-10-CM

## 2022-10-15 DIAGNOSIS — Z794 Long term (current) use of insulin: Secondary | ICD-10-CM

## 2022-10-15 DIAGNOSIS — M2041 Other hammer toe(s) (acquired), right foot: Secondary | ICD-10-CM

## 2022-10-15 DIAGNOSIS — M2061 Acquired deformities of toe(s), unspecified, right foot: Secondary | ICD-10-CM

## 2022-10-15 DIAGNOSIS — M79675 Pain in left toe(s): Secondary | ICD-10-CM

## 2022-10-15 MED ORDER — CICLOPIROX 8 % EX SOLN: Freq: Every day | CUTANEOUS | 0 refills | Status: AC

## 2022-10-15 NOTE — Addendum Note (Signed)
Addended by: Carlena Hurl F on: 10/15/2022 02:39 PM   Modules accepted: Orders

## 2022-10-15 NOTE — Progress Notes (Signed)
  Subjective:  Patient ID: Marie Chen, female    DOB: 1960/09/29,  MRN: [id number]  No chief complaint on file.   62 y.o. female presents with the above complaint. History confirmed with patient. Patient presenting with pain related to dystrophic thickened elongated nails. Patient is unable to trim own nails related to nail dystrophy and/or mobility issues. Patient does does have a history of T2DM.   Objective:  Physical Exam: warm, good capillary refill nail exam onychomycosis of the toenails, onycholysis, and dystrophic nails DP pulses palpable, PT pulses palpable, and protective sensation absent Left Foot:  Pain with palpation of nails due to elongation and dystrophic growth.  HAV deformity and hammertoe deformity with transverse plane deviation of the toe Right Foot: Pain with palpation of nails due to elongation and dystrophic growth. HAV deformity and hammertoe deformity with transverse plane deviation of the toe  Assessment:   1. Pain due to onychomycosis of toenails of both feet   2. Pre-ulcerative calluses   3. Acquired deformity of right toe   4. Bunion   5. Acquired hammertoe of right foot   6. Diabetes mellitus due to underlying condition with hyperosmolarity without coma, with long-term current use of insulin (HCC)      Plan:  Patient was evaluated and treated and all questions answered.  # Bunion and second hammertoe bilateral foot -Discussed the patient I do recommend manufacture of diabetic shoes and liners to offload the plantar forefoot and prevent ulcerations from developing -Patient will be scheduled for casting For diabetic shoes and liners  #Onychomycosis with pain  -Nails palliatively debrided as below. -Educated on self-care  Procedure: Nail Debridement Rationale: Pain Type of Debridement: manual, sharp debridement. Instrumentation: Nail nipper, rotary burr. Number of Nails: 10  Return in about 3 months (around 01/15/2023) for [facility].         Corinna Gab, DPM [facility]

## 2022-10-23 ENCOUNTER — Other Ambulatory Visit: Payer: Self-pay

## 2022-10-26 ENCOUNTER — Encounter: Payer: Self-pay | Admitting: Cardiology

## 2022-10-26 ENCOUNTER — Ambulatory Visit: Payer: Medicare HMO | Attending: Cardiology | Admitting: Cardiology

## 2022-10-26 VITALS — BP 112/77 | HR 72 | Ht 67.6 in | Wt 262.4 lb

## 2022-10-26 DIAGNOSIS — G4733 Obstructive sleep apnea (adult) (pediatric): Secondary | ICD-10-CM | POA: Diagnosis not present

## 2022-10-26 DIAGNOSIS — I2510 Atherosclerotic heart disease of native coronary artery without angina pectoris: Secondary | ICD-10-CM

## 2022-10-26 DIAGNOSIS — I10 Essential (primary) hypertension: Secondary | ICD-10-CM

## 2022-10-26 DIAGNOSIS — E785 Hyperlipidemia, unspecified: Secondary | ICD-10-CM

## 2022-10-26 DIAGNOSIS — E088 Diabetes mellitus due to underlying condition with unspecified complications: Secondary | ICD-10-CM

## 2022-10-26 NOTE — Progress Notes (Signed)
Cardiology Office Note:    Date:  10/26/2022   ID:  Marie Chen, DOB 05/10/1961, MRN [id number]  PCP:  Marie Jabs, [name]  Cardiologist:  Marie Brothers, Chen   Referring Chen: Marie Jabs, [name]    ASSESSMENT:    1. Coronary artery disease involving native coronary artery of native heart without angina pectoris   2. Sleep apnea, obstructive   3. Diabetes mellitus due to underlying condition with unspecified complications (HCC)   4. Morbid obesity (HCC)   5. Dyslipidemia   6. Essential hypertension    PLAN:    In order of problems listed above:  Coronary artery disease: Secondary prevention stressed with patient.  Importance of compliance with diet medication stressed and she vocalized understanding.  She was advised to ambulate to the best of her ability. Chest pain: Atypical in nature.  I reassured her about this.  Coronary angiography report was discussed with her at length and questions were answered to her satisfaction.  Sublingual nitroglycerin prescription was sent, its protocol and 911 protocol explained and the patient vocalized understanding questions were answered to the patient's satisfaction.  I told her that I would prefer not to use medication such as Imdur because of borderline blood pressure and flare of hypotension.  She is using diuretics because of pedal edema. Essential hypertension: Blood pressure stable and diet was emphasized. Mixed dyslipidemia and morbid obesity: Lifestyle modification urged.  Diabetes and lipids are under good control.  I reviewed lab work.  Discussed with the patient at length. Patient will be seen in follow-up appointment in 9 months or earlier if the patient has any concerns.    Medication Adjustments/Labs and Tests Ordered: Current medicines are reviewed at length with the patient today.  Concerns regarding medicines are outlined above.  No orders of the defined types were placed in this encounter.  No orders of the defined types  were placed in this encounter.    No chief complaint on file.    History of Present Illness:    Marie Chen is Chen 62 y.o. female.  Patient has past medical history of coronary artery disease, essential hypertension, mixed dyslipidemia and diabetes mellitus.  She is morbidly obese and leads Chen sedentary lifestyle.  She gives history of chest discomfort at times not related to exertion.  This happens when she is just sitting and not doing much.  No orthopnea or PND.  Does not come on with stress.  At the time of my evaluation, the patient is alert awake oriented and in no distress.  Past Medical History:  Diagnosis Date   Acute hypoxemic respiratory failure due to severe acute respiratory syndrome coronavirus 2 (SARS-CoV-2) disease (HCC) 06/18/2019   Acute pharyngitis 05/23/2009   Qualifier: Diagnosis of  By: Marie Coyer Chen, Marie Chen    Allergic rhinitis 03/26/2007   Qualifier: Diagnosis of  By: Marie Chen, Marie Chen   Formatting of this note might be different from the original. Overview:  Qualifier: Diagnosis of  By: Marie Chen, Marie Chen   Angina pectoris (HCC) 12/04/2018   Asthma    severe   Asthma with exacerbation 03/26/2007   Formatting of this note might be different from the original. Overview:  Qualifier: Diagnosis of  By: Marie Chen, Marie Chen   ASTHMATIC BRONCHITIS, ACUTE 04/27/2008   Qualifier: Diagnosis of  By: Marie Chen, Marie Chen    Bronchitis, chronic (HCC) 04/22/2008   Qualifier: Diagnosis of  By: Marie Chen of  this note might be different from the original. Overview:  Qualifier: Diagnosis of  By: Marie Chen, Marie Mccreedy   CAD (coronary artery disease) 01/01/2019   Carpal tunnel syndrome of left wrist 12/30/2019   Chronic obstructive pulmonary disease (HCC) 06/19/2019   Chronic pain syndrome 04/25/2017   Community acquired pneumonia 01/15/2020   Coronary artery disease    Cough with hemoptysis 05/11/2018   Depression    DEPRESSION 01/05/2007   Qualifier:  Diagnosis of  By: Marie Chen, Marie Chen    Diabetes mellitus (HCC) 10/07/2020   Diabetes mellitus due to underlying condition with unspecified complications (HCC) 03/26/2007   Qualifier: Diagnosis of  By: Marie Chen, Marie Chen    Diabetes mellitus in remission (HCC) 07/03/2021   Diabetes mellitus type 2 in obese 05/12/2018   Diabetes mellitus without complication (HCC)    type 2   DSORD CIRCADIAN RHY SHFT WRK SLEEP PHASE TYPE 03/26/2007   Qualifier: Diagnosis of  By: Marie Chen, Marie Chen   Formatting of this note might be different from the original. Overview:  Qualifier: Diagnosis of  By: Marie Chen, Marie Chen   Dyslipidemia 10/19/2019   DYSPHAGIA 04/27/2008   Qualifier: Diagnosis of  By: Marie Chen    Dyspnea    with exertion   Essential hypertension 01/05/2007   Qualifier: Diagnosis of  By: Marie Chen, Marie Chen    Fatigue 07/03/2021   Fatty liver    Fibromyalgia    GERD 01/05/2007   Qualifier: Diagnosis of  By: Marie Chen, Marie Chen    GERD (gastroesophageal reflux disease)    HAMMER TOE 01/05/2007   Qualifier: Diagnosis of  By: Marie Chen, Marie Chen    Headache 02/24/2009   Qualifier: Diagnosis of  By: Marie Chen, Marie Chen   Hemoptysis 05/11/2018   Hyperlipidemia 01/05/2007   diet controlled Formatting of this note might be different from the original. Overview:  Qualifier: Diagnosis of  By: Marie Chen, Marie Chen   Hypertension    Hypothyroidism    Inflammatory arthritis 07/22/2020   IRRITABLE BOWEL SYNDROME, HX OF 03/26/2007   Qualifier: Diagnosis of  By: Marie Chen, Marie Chen    Lumbar facet arthropathy 04/25/2017   Lumbar radiculopathy 09/19/2017   Migraine headache 04/22/2008   Formatting of this note might be different from the original. Overview:  Qualifier: Diagnosis of  By: Marie Chen   Mixed dyslipidemia 01/05/2007   Qualifier: Diagnosis of  By: Marie Chen, Marie Chen    Morbid obesity (HCC) 12/04/2018   Morbid obesity with BMI of 50.0-59.9, adult (HCC) 09/23/2019   Numbness 12/04/2019   Obesity  (BMI 35.0-39.9 without comorbidity) 10/30/2021   Old disruption of left lateral collateral ligament 12/04/2019   Osteoarthritis 07/22/2020   Pain from implanted hardware 08/01/2017   Pain in left shoulder 07/13/2020   Pain in unspecified knee 03/04/2017   PEPTIC ULCER DISEASE 03/26/2007   Qualifier: Diagnosis of   By: Marie Chen, Marie Chen     Replacing diagnoses that were inactivated after the 09/10/22 regulatory import   PERSONAL HISTORY ENDOCRN METABOLIC&IMMUNITY D/O 04/22/2008   Qualifier: Diagnosis of  By: Marie Chen, Barbara     Plantar callus 08/01/2017   Plantar fasciitis 08/01/2017   Pre-operative cardiovascular examination 07/25/2020   Pre-ulcerative corn or callous 01/30/2018   Preop cardiovascular exam 01/22/2020   Primary osteoarthritis of both hips 04/25/2017   Primary osteoarthritis of both knees 04/25/2017   RESTLESS LEG SYNDROME, HX OF 03/26/2007   Qualifier: Diagnosis  of  By: Marie Chen, Marie Chen    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 Overview:  Qualifier: Diagnosis of  By: Marie Chen, Marie Chen   Thoracic spine pain 11/13/2017   Thyroid disease    Trigger finger, left little finger 12/30/2019   Trochanteric bursitis of left hip 03/02/2018   Type 2 diabetes mellitus with diabetic polyneuropathy, with long-term current use of insulin (HCC) 01/26/2020   Type 2 diabetes mellitus with hyperglycemia, without long-term current use of insulin (HCC) 10/19/2019   Vitamin D deficiency, unspecified 08/25/2018    Past Surgical History:  Procedure Laterality Date   ANKLE SURGERY  2010   BACK SURGERY     CARPAL TUNNEL RELEASE Bilateral     CHOLECYSTECTOMY     COLONOSCOPY  04/2014   FOOT SURGERY     bunion surgery bialteral   HYSTEROSCOPY     LEFT HEART CATH AND CORONARY ANGIOGRAPHY N/Chen 12/19/2018   Procedure: LEFT HEART CATH AND CORONARY ANGIOGRAPHY;  Surgeon: Yvonne Kendall, Chen;  Location: [facility] INV[facility];  Service: Cardiovascular;  Laterality: N/Chen;   LEFT HEART CATH AND CORONARY ANGIOGRAPHY N/Chen 02/16/2021   Procedure: LEFT HEART CATH AND CORONARY ANGIOGRAPHY;  Surgeon: Yvonne Kendall, Chen;  Location: [facility] INVASIVE CV [facility];  Service: Cardiovascular;  Laterality: N/Chen;   LIGAMENT REPAIR Left 02/04/2020   Procedure: REPAIR RADIAL COLLATERAL LIGAMENT METACARPAL PHALANGEAL LEFT SMALL FINGER; POSSIBLE PINNING;  Surgeon: Cindee Salt, Chen;  Location: [facility] OR;  Service: Orthopedics;  Laterality: Left;  AXILLARY BLOCK   RESECTION DISTAL CLAVICAL Right    STERIOD INJECTION Left 02/04/2020   Procedure: INJECTION LEFT CARPAL TUNNEL;  Surgeon: Cindee Salt, Chen;  Location: [facility] OR;  Service: Orthopedics;  Laterality: Left;   TUBAL LIGATION     UPPER GI ENDOSCOPY  04/2014   WISDOM TOOTH EXTRACTION      Current Medications: Current Meds  Medication Sig   ACCU-CHEK AVIVA PLUS test strip USE AS INSTRUCTED TO TEST BLOOD SUGAR UP TO 3 TIMES DAILY   Accu-Chek FastClix Lancets MISC Check blood sugars 3 times daily   albuterol (PROVENTIL) (2.5 MG/3ML) 0.083% nebulizer solution Take 2.5 mg by nebulization every 6 (six) hours as needed for wheezing or shortness of breath.    Alcohol Swabs (DROPSAFE ALCOHOL PREP) 70 % PADS USE AS DIRECTED   Blood Glucose Monitoring Suppl (ACCU-CHEK AVIVA PLUS) w/Device KIT Check blood sugar 3 times daily   calcium carbonate (TUMS - DOSED IN MG ELEMENTAL CALCIUM) 500 MG chewable tablet Chew 1 tablet by mouth 3 (three) times daily.   Carboxymethylcellul-Glycerin (LUBRICATING EYE DROPS OP) Place 1 drop into both eyes daily as needed for dry eyes (dry eyes).   ciclopirox (PENLAC) 8 % solution Apply topically at bedtime. Apply  over nail and surrounding skin. Apply daily over previous coat. After seven (7) days, may remove with alcohol and continue cycle.   cyclobenzaprine (FLEXERIL) 10 MG tablet Take 10 mg by mouth 2 (two) times daily as needed for muscle spasms.   docusate sodium (COLACE) 100 MG capsule Take 1 capsule (100 mg total) by mouth 2 (two) times  daily.   escitalopram (LEXAPRO) 20 MG tablet Take 20 mg by mouth at bedtime.    Evolocumab (REPATHA) 140 MG/ML SOSY 140 mg by Other route every 14 (fourteen) days.   fluticasone (FLONASE) 50 MCG/ACT nasal spray Place 1 spray into both nostrils daily as needed for allergies.    Fluticasone-Umeclidin-Vilant (TRELEGY ELLIPTA) 100-62.5-25 MCG/INH AEPB Inhale 1 puff into the lungs daily.    glucose blood (ONETOUCH VERIO) test strip Check blood sugar 3 times daily   hydrochlorothiazide (HYDRODIURIL) 25 MG tablet Take 25 mg by mouth daily.   insulin lispro (HUMALOG KWIKPEN) 100 UNIT/ML KwikPen Max daily 15 units   Insulin Pen Needle 32G X 4 MM MISC 1 Device by Does not apply route 3 (three) times daily.   ketoconazole (NIZORAL) 2 % cream Apply 1 Application topically daily.   levothyroxine (SYNTHROID) 88 MCG tablet Take Half Chen tablet of levothyroxine 88 mcg  on Sundays and 1 tablet Monday through Saturday   lisinopril (ZESTRIL) 10 MG tablet Take 10 mg by mouth daily.   montelukast (SINGULAIR) 10 MG tablet Take 10 mg by mouth daily.   nitroGLYCERIN (NITROSTAT) 0.4 MG SL tablet Place 1 tablet (0.4 mg total) under the tongue every 5 (five) minutes as needed for chest pain.   nystatin (MYCOSTATIN/NYSTOP) powder Apply 1 application topically 2 (two) times daily as needed for dry skin (dry skin).   omeprazole (PRILOSEC) 40 MG capsule Take 1 capsule by mouth daily.   oxycodone-acetaminophen (LYNOX) 10-300 MG tablet Take 1 tablet by mouth every 6 (six) hours as needed for pain.   pregabalin (LYRICA) 150 MG capsule Take 150 mg by mouth 3 (three) times daily.   terconazole (TERAZOL  7) 0.4 % vaginal cream Place 1 applicator vaginally at bedtime as needed for other (yeast infections). Yeast infection    topiramate (TOPAMAX) 25 MG capsule Take 50 mg by mouth 2 (two) times daily.   Vitamin D, Ergocalciferol, (DRISDOL) 1.25 MG (50000 UT) CAPS capsule Take 50,000 Units by mouth every Saturday.   ZTLIDO 1.8 % PTCH Apply 1 patch topically 3 (three) times daily.     Allergies:   Macrodantin [nitrofurantoin macrocrystal], Metformin and related, Atorvastatin, Hydromorphone hcl, Lipitor [atorvastatin calcium], Meperidine hcl, and Tetracyclines & related   Social History   Socioeconomic History   Marital status: Widowed    Spouse name: Not on file   Number of children: 4   Years of education: Not on file   Highest education level: Not on file  Occupational History   Occupation: CSR    Employer: [employer]  Tobacco Use   Smoking status: Former    Types: Cigarettes    Quit date: 06/15/2019    Years since quitting: 3.3   Smokeless tobacco: Never  Vaping Use   Vaping Use: Never used  Substance and Sexual Activity   Alcohol use: No    Alcohol/week: 0.0 standard drinks of alcohol   Drug use: No   Sexual activity: Yes    Birth control/protection: Post-menopausal  Other Topics Concern   Not on file  Social History Narrative   Not on file   Social Determinants of Health   Financial Resource Strain: Not on file  Food Insecurity: Not on file  Transportation Needs: Not on file  Physical Activity: Not on file  Stress: Not on file  Social Connections: Not on file     Family History: The patient's family history includes Heart failure in her father. There is no history of Colon cancer.  ROS:   Please see the history of present illness.    All other systems reviewed and are negative.  EKGs/Labs/Other Studies Reviewed:    The following studies were reviewed today: EKG reveals sinus rhythm poor anterior forces and nonspecific ST-T changes   Recent Labs: 07/09/2022:  ALT 17; BUN 22; Creatinine, Ser 0.77; Potassium 3.8; Sodium 140; TSH 1.43  Recent Lipid Panel    Component Value Date/Time   CHOL 116 07/09/2022 1105   CHOL 179 01/07/2019 1019   TRIG 82.0 07/09/2022 1105   HDL 50.20 07/09/2022 1105   HDL 34 (L) 01/07/2019 1019   CHOLHDL 2 07/09/2022 1105   VLDL 16.4 07/09/2022 1105   LDLCALC 49 07/09/2022 1105   LDLCALC 123 (H) 01/07/2019 1019   LDLDIRECT 179.5 03/14/2009 1147    Physical Exam:    VS:  BP 112/77   Pulse 72   Ht 5' 7.6" (1.717 m)   Wt 262 lb 6.4 oz (119 kg)   LMP  (LMP Unknown)   SpO2 97%   BMI 40.37 kg/m     Wt Readings from Last 3 Encounters:  10/26/22 262 lb 6.4 oz (119 kg)  07/09/22 260 lb (117.9 kg)  01/03/22 248 lb (112.5 kg)     GEN: Patient is in no acute distress HEENT: Normal NECK: No JVD; No carotid bruits LYMPHATICS: No lymphadenopathy CARDIAC: Hear sounds regular, 2/6 systolic murmur at the apex. RESPIRATORY:  Clear to auscultation without rales, wheezing or rhonchi  ABDOMEN: Soft, non-tender, non-distended MUSCULOSKELETAL:  No edema; No deformity  SKIN: Warm and dry NEUROLOGIC:  Alert and oriented x 3 PSYCHIATRIC:  Normal affect   Signed, Marie Brothers, Chen  10/26/2022 1:29 PM    [facility]

## 2022-10-26 NOTE — Patient Instructions (Signed)
Medication Instructions:  Your physician recommends that you continue on your current medications as directed. Please refer to the Current Medication list given to you today.  *If you need a refill on your cardiac medications before your next appointment, please call your pharmacy*   Lab Work: None ordered If you have labs (blood work) drawn today and your tests are completely normal, you will receive your results only by: MyChart Message (if you have MyChart) OR A paper copy in the mail If you have any lab test that is abnormal or we need to change your treatment, we will call you to review the results.   Testing/Procedures: None ordered   Follow-Up: At [facility], you and your health needs are our priority.  As part of our continuing mission to provide you with exceptional heart care, we have created designated Provider Care Teams.  These Care Teams include your primary Cardiologist (physician) and Advanced Practice Providers (APPs -  Physician Assistants and Nurse Practitioners) who all work together to provide you with the care you need, when you need it.  We recommend signing up for the patient portal called "MyChart".  Sign up information is provided on this After Visit Summary.  MyChart is used to connect with patients for Virtual Visits (Telemedicine).  Patients are able to view lab/test results, encounter notes, upcoming appointments, etc.  Non-urgent messages can be sent to your provider as well.   To learn more about what you can do with MyChart, go to [email or web address].    Your next appointment:   9 month(s)  The format for your next appointment:   In Person  Provider:   Rajan Revankar, MD    Other Instructions none  Important Information About Sugar       

## 2022-11-20 ENCOUNTER — Other Ambulatory Visit: Payer: Medicare HMO

## 2022-12-05 ENCOUNTER — Ambulatory Visit: Payer: Medicare HMO

## 2022-12-05 DIAGNOSIS — Z794 Long term (current) use of insulin: Secondary | ICD-10-CM

## 2022-12-05 DIAGNOSIS — M21621 Bunionette of right foot: Secondary | ICD-10-CM

## 2022-12-05 DIAGNOSIS — L84 Corns and callosities: Secondary | ICD-10-CM

## 2022-12-05 DIAGNOSIS — M2041 Other hammer toe(s) (acquired), right foot: Secondary | ICD-10-CM

## 2022-12-05 NOTE — Progress Notes (Unsigned)
Patient presents today to be measured for  diabetic shoes and insoles.  Patient was measured for  1 pair of diabetic shoes and 3 pairs of foam casted diabetic insoles.  Ht 5'6 Wt 254 Shoe size 9w Shoe type  843  Treating physician is Dr I shamleffer  Re-appointment for regularly scheduled diabetic foot care visits or if they should experience any trouble with the shoes or insoles.

## 2022-12-08 ENCOUNTER — Other Ambulatory Visit: Payer: Self-pay

## 2022-12-08 ENCOUNTER — Emergency Department (HOSPITAL_COMMUNITY)
Admission: EM | Admit: 2022-12-08 | Discharge: 2022-12-08 | Disposition: A | Discharge status: Home / Self Care | Payer: Medicare HMO | Attending: Emergency Medicine | Admitting: Emergency Medicine

## 2022-12-08 ENCOUNTER — Emergency Department (HOSPITAL_COMMUNITY): Payer: Medicare HMO

## 2022-12-08 DIAGNOSIS — S8264XA Nondisplaced fracture of lateral malleolus of right fibula, initial encounter for closed fracture: Secondary | ICD-10-CM | POA: Insufficient documentation

## 2022-12-08 DIAGNOSIS — E1142 Type 2 diabetes mellitus with diabetic polyneuropathy: Secondary | ICD-10-CM | POA: Insufficient documentation

## 2022-12-08 DIAGNOSIS — Y92410 Unspecified street and highway as the place of occurrence of the external cause: Secondary | ICD-10-CM | POA: Diagnosis not present

## 2022-12-08 DIAGNOSIS — Z8616 Personal history of COVID-19: Secondary | ICD-10-CM | POA: Diagnosis not present

## 2022-12-08 DIAGNOSIS — I2510 Atherosclerotic heart disease of native coronary artery without angina pectoris: Secondary | ICD-10-CM | POA: Diagnosis not present

## 2022-12-08 DIAGNOSIS — R079 Chest pain, unspecified: Secondary | ICD-10-CM | POA: Insufficient documentation

## 2022-12-08 DIAGNOSIS — J449 Chronic obstructive pulmonary disease, unspecified: Secondary | ICD-10-CM | POA: Insufficient documentation

## 2022-12-08 DIAGNOSIS — J45909 Unspecified asthma, uncomplicated: Secondary | ICD-10-CM | POA: Insufficient documentation

## 2022-12-08 DIAGNOSIS — E039 Hypothyroidism, unspecified: Secondary | ICD-10-CM | POA: Insufficient documentation

## 2022-12-08 DIAGNOSIS — Z955 Presence of coronary angioplasty implant and graft: Secondary | ICD-10-CM | POA: Diagnosis not present

## 2022-12-08 DIAGNOSIS — S99921A Unspecified injury of right foot, initial encounter: Secondary | ICD-10-CM | POA: Diagnosis present

## 2022-12-08 DIAGNOSIS — I10 Essential (primary) hypertension: Secondary | ICD-10-CM | POA: Insufficient documentation

## 2022-12-08 DIAGNOSIS — Z87891 Personal history of nicotine dependence: Secondary | ICD-10-CM | POA: Insufficient documentation

## 2022-12-08 MED ORDER — PREGABALIN 25 MG PO CAPS
150.0000 mg | ORAL_CAPSULE | Freq: Once | ORAL | Status: AC
  Administered 2022-12-08: 150 mg via ORAL
  Filled 2022-12-08: qty 2

## 2022-12-08 MED ORDER — CYCLOBENZAPRINE HCL 10 MG PO TABS
10.0000 mg | ORAL_TABLET | Freq: Once | ORAL | Status: AC
  Administered 2022-12-08: 10 mg via ORAL
  Filled 2022-12-08: qty 1

## 2022-12-08 MED ORDER — OXYCODONE-ACETAMINOPHEN 5-325 MG PO TABS
1.0000 | ORAL_TABLET | Freq: Once | ORAL | Status: AC
  Administered 2022-12-08: 1 via ORAL
  Filled 2022-12-08: qty 1
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
## 2022-12-08 NOTE — Discharge Instructions (Signed)
Marie Chen:  Thank you for allowing [place] to take care of you today.  We hope you begin feeling better soon.  To-Do: Please follow-up with orthopedic surgery.  Call the number provided to set up an appointment. Wear your boot with walking. Please return to the Emergency Department or call 911 if you experience chest pain, shortness of breath, severe pain, severe fever, altered mental status, or have any reason to think that you need emergency medical care.  Thank you again.  Hope you feel better soon.  Department of Emergency Medicine [facility]

## 2022-12-08 NOTE — ED Notes (Signed)
Patient transported to X-ray 
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
## 2023-01-01 ENCOUNTER — Ambulatory Visit: Payer: Medicare HMO | Admitting: Podiatry

## 2023-01-01 ENCOUNTER — Ambulatory Visit: Payer: Medicare HMO

## 2023-01-01 DIAGNOSIS — E1142 Type 2 diabetes mellitus with diabetic polyneuropathy: Secondary | ICD-10-CM | POA: Diagnosis not present

## 2023-01-01 DIAGNOSIS — E119 Type 2 diabetes mellitus without complications: Secondary | ICD-10-CM

## 2023-01-01 DIAGNOSIS — M21621 Bunionette of right foot: Secondary | ICD-10-CM

## 2023-01-01 DIAGNOSIS — L84 Corns and callosities: Secondary | ICD-10-CM | POA: Diagnosis not present

## 2023-01-01 DIAGNOSIS — M2041 Other hammer toe(s) (acquired), right foot: Secondary | ICD-10-CM

## 2023-01-01 DIAGNOSIS — E0800 Diabetes mellitus due to underlying condition with hyperosmolarity without nonketotic hyperglycemic-hyperosmolar coma (NKHHC): Secondary | ICD-10-CM

## 2023-01-01 NOTE — Progress Notes (Unsigned)

## 2023-01-09 ENCOUNTER — Ambulatory Visit: Payer: Medicare HMO | Admitting: Internal Medicine

## 2023-01-09 ENCOUNTER — Encounter: Payer: Self-pay | Admitting: Internal Medicine

## 2023-01-09 DIAGNOSIS — E1142 Type 2 diabetes mellitus with diabetic polyneuropathy: Secondary | ICD-10-CM

## 2023-01-09 DIAGNOSIS — E890 Postprocedural hypothyroidism: Secondary | ICD-10-CM | POA: Diagnosis not present

## 2023-01-09 DIAGNOSIS — Z794 Long term (current) use of insulin: Secondary | ICD-10-CM

## 2023-01-09 DIAGNOSIS — E1159 Type 2 diabetes mellitus with other circulatory complications: Secondary | ICD-10-CM

## 2023-01-09 DIAGNOSIS — E785 Hyperlipidemia, unspecified: Secondary | ICD-10-CM | POA: Diagnosis not present

## 2023-01-09 LAB — COMPREHENSIVE METABOLIC PANEL
ALT: 8 U/L (ref 0–35)
AST: 12 U/L (ref 0–37)
Albumin: 3.7 g/dL (ref 3.5–5.2)
Alkaline Phosphatase: 85 U/L (ref 39–117)
BUN: 17 mg/dL (ref 6–23)
CO2: 29 mEq/L (ref 19–32)
Calcium: 9.4 mg/dL (ref 8.4–10.5)
Chloride: 103 mEq/L (ref 96–112)
Creatinine, Ser: 0.88 mg/dL (ref 0.40–1.20)
GFR: 69.71 mL/min (ref >60.00)
Glucose, Bld: 155 mg/dL — ABNORMAL HIGH (ref 70–99)
Potassium: 3.9 mEq/L (ref 3.5–5.1)
Sodium: 139 mEq/L (ref 135–145)
Total Bilirubin: 0.5 mg/dL (ref 0.2–1.2)
Total Protein: 7.1 g/dL (ref 6.0–8.3)

## 2023-01-09 LAB — LIPID PANEL
Cholesterol: 97 mg/dL (ref 0–200)
HDL: 37.7 mg/dL — ABNORMAL LOW (ref >39.00)
LDL Cholesterol: 38 mg/dL (ref 0–99)
NonHDL: 59.44
Total CHOL/HDL Ratio: 3
Triglycerides: 106 mg/dL (ref 0.0–149.0)
VLDL: 21.2 mg/dL (ref 0.0–40.0)

## 2023-01-09 LAB — POCT GLYCOSYLATED HEMOGLOBIN (HGB A1C): Hemoglobin A1C: 6.8 % — AB (ref 4.0–5.6)

## 2023-01-09 LAB — POCT GLUCOSE (DEVICE FOR HOME USE): POC Glucose: 226 mg/dl — AB (ref 70–99)

## 2023-01-09 LAB — MICROALBUMIN / CREATININE URINE RATIO
Creatinine,U: 81.3 mg/dL
Microalb Creat Ratio: 6.7 mg/g (ref 0.0–30.0)
Microalb, Ur: 5.5 mg/dL — ABNORMAL HIGH (ref 0.0–1.9)

## 2023-01-09 LAB — VITAMIN D 25 HYDROXY (VIT D DEFICIENCY, FRACTURES): VITD: 70.27 ng/mL (ref 30.00–100.00)

## 2023-01-09 LAB — TSH: TSH: 0.59 u[IU]/mL (ref 0.35–5.50)

## 2023-01-09 MED ORDER — SEMAGLUTIDE(0.25 OR 0.5MG/DOS) 2 MG/3ML ~~LOC~~ SOPN
0.5000 mg | PEN_INJECTOR | SUBCUTANEOUS | 3 refills | Status: DC
Start: 2023-01-09 — End: 2023-01-09

## 2023-01-09 MED ORDER — SEMAGLUTIDE(0.25 OR 0.5MG/DOS) 2 MG/3ML ~~LOC~~ SOPN: 0.5000 mg | PEN_INJECTOR | SUBCUTANEOUS | 0 refills | Status: DC

## 2023-01-09 NOTE — Patient Instructions (Addendum)
Start Ozempic 0.25 mg once weekly for 6 weeks, then increase to 0.5 mg weekly     Take Half a tablet of levothyroxine 88 mcg  on Sundays and 1 tablet Monday through Saturday
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
## 2023-01-14 ENCOUNTER — Ambulatory Visit (INDEPENDENT_AMBULATORY_CARE_PROVIDER_SITE_OTHER): Payer: Medicare HMO | Admitting: Podiatry

## 2023-01-14 DIAGNOSIS — M79675 Pain in left toe(s): Secondary | ICD-10-CM

## 2023-01-14 DIAGNOSIS — Z794 Long term (current) use of insulin: Secondary | ICD-10-CM | POA: Diagnosis not present

## 2023-01-14 DIAGNOSIS — L84 Corns and callosities: Secondary | ICD-10-CM | POA: Diagnosis not present

## 2023-01-14 DIAGNOSIS — E1142 Type 2 diabetes mellitus with diabetic polyneuropathy: Secondary | ICD-10-CM

## 2023-01-14 DIAGNOSIS — M79674 Pain in right toe(s): Secondary | ICD-10-CM

## 2023-01-14 DIAGNOSIS — E0800 Diabetes mellitus due to underlying condition with hyperosmolarity without nonketotic hyperglycemic-hyperosmolar coma (NKHHC): Secondary | ICD-10-CM | POA: Diagnosis not present

## 2023-01-14 DIAGNOSIS — B351 Tinea unguium: Secondary | ICD-10-CM

## 2023-01-14 NOTE — Progress Notes (Signed)
  Subjective:  Patient ID: Marie Chen, female    DOB: Dec 03, 1960,  MRN: [id number]  Chief Complaint  Patient presents with   Diabetes    DFC A1C - 6.8   Callouses    CALLUS BILAT     62 y.o. female presents with the above complaint. History confirmed with patient. Patient presenting with pain related to dystrophic thickened elongated nails. Patient is unable to trim own nails related to nail dystrophy and/or mobility issues. Patient does does have a history of T2DM.  Patient does have painful hyperkeratotic callus subfirst metatarsal head bilateral foot  Objective:  Physical Exam: warm, good capillary refill nail exam onychomycosis of the toenails, onycholysis, and dystrophic nails DP pulses palpable, PT pulses palpable, and protective sensation absent Left Foot:  Pain with palpation of nails due to elongation and dystrophic growth.  HAV deformity and hammertoe deformity with transverse plane deviation of the toe.  Hyperkeratotic callus without underlying ulceration present plantar aspect first metatarsal head Right Foot: Pain with palpation of nails due to elongation and dystrophic growth. HAV deformity and hammertoe deformity with transverse plane deviation of the toe. Hyperkeratotic callus without underlying ulceration present plantar aspect first metatarsal head  Assessment:   1. Pain due to onychomycosis of toenails of both feet   2. Pre-ulcerative calluses   3. Diabetes mellitus due to underlying condition with hyperosmolarity without coma, with long-term current use of insulin (HCC)       Plan:  Patient was evaluated and treated and all questions answered.  # Preulcerative callus bilateral foot All symptomatic hyperkeratoses were safely debrided with a sterile #15 blade to patient's level of comfort without incident. We discussed preventative and palliative care of these lesions including supportive and accommodative shoegear, padding, prefabricated and custom molded  accommodative orthoses, use of a pumice stone and lotions/creams daily.  #Onychomycosis with pain  -Nails palliatively debrided as below. -Educated on self-care  Procedure: Nail Debridement Rationale: Pain Type of Debridement: manual, sharp debridement. Instrumentation: Nail nipper, rotary burr. Number of Nails: 10  Return in about 3 months (around 04/16/2023) for [facility].         Corinna Gab, DPM [facility] / [facility]

## 2023-02-04 ENCOUNTER — Encounter: Payer: Self-pay | Admitting: Physician Assistant

## 2023-03-04 ENCOUNTER — Ambulatory Visit: Payer: Medicare HMO

## 2023-03-04 ENCOUNTER — Ambulatory Visit: Payer: Medicare HMO | Admitting: Physician Assistant

## 2023-03-28 ENCOUNTER — Telehealth: Payer: Self-pay

## 2023-03-28 NOTE — Telephone Encounter (Signed)
Patient has finished the Ozempic 0.5mg  and would like to go to the next dose.

## 2023-03-29 NOTE — Telephone Encounter (Signed)
LMTCB

## 2023-03-29 NOTE — Telephone Encounter (Signed)
Patient aware.

## 2023-04-16 ENCOUNTER — Ambulatory Visit (INDEPENDENT_AMBULATORY_CARE_PROVIDER_SITE_OTHER): Payer: Medicare HMO | Admitting: Podiatry

## 2023-04-16 DIAGNOSIS — M79674 Pain in right toe(s): Secondary | ICD-10-CM | POA: Diagnosis not present

## 2023-04-16 DIAGNOSIS — B351 Tinea unguium: Secondary | ICD-10-CM

## 2023-04-16 DIAGNOSIS — L84 Corns and callosities: Secondary | ICD-10-CM | POA: Diagnosis not present

## 2023-04-16 DIAGNOSIS — M79675 Pain in left toe(s): Secondary | ICD-10-CM | POA: Diagnosis not present

## 2023-04-16 DIAGNOSIS — E1142 Type 2 diabetes mellitus with diabetic polyneuropathy: Secondary | ICD-10-CM | POA: Diagnosis not present

## 2023-04-16 NOTE — Progress Notes (Signed)
  Subjective:  Patient ID: Marie Chen, female    DOB: January 24, 1961,  MRN: [id number]  No chief complaint on file.   62 y.o. female presents with the above complaint. History confirmed with patient. Patient presenting with pain related to dystrophic thickened elongated nails. Patient is unable to trim own nails related to nail dystrophy and/or mobility issues. Patient does does have a history of T2DM.  Patient does have painful hyperkeratotic callus subfirst metatarsal head bilateral foot  Objective:  Physical Exam: warm, good capillary refill nail exam onychomycosis of the toenails, onycholysis, and dystrophic nails DP pulses palpable, PT pulses palpable, and protective sensation absent Left Foot:  Pain with palpation of nails due to elongation and dystrophic growth.  HAV deformity and hammertoe deformity with transverse plane deviation of the toe.  Hyperkeratotic callus without underlying ulceration present plantar aspect first metatarsal head Right Foot: Pain with palpation of nails due to elongation and dystrophic growth. HAV deformity and hammertoe deformity with transverse plane deviation of the toe. Hyperkeratotic callus without underlying ulceration present plantar aspect first metatarsal head  Assessment:   1. Pre-ulcerative calluses   2. Pain due to onychomycosis of toenails of both feet   3. Diabetic polyneuropathy associated with type 2 diabetes mellitus (HCC)        Plan:  Patient was evaluated and treated and all questions answered.  # Preulcerative callus bilateral foot All symptomatic hyperkeratoses were safely debrided with a sterile #15 blade to patient's level of comfort without incident. We discussed preventative and palliative care of these lesions including supportive and accommodative shoegear, padding, prefabricated and custom molded accommodative orthoses, use of a pumice stone and lotions/creams daily.  #Onychomycosis with pain  -Nails palliatively  debrided as below. -Educated on self-care  Procedure: Nail Debridement Rationale: Pain Type of Debridement: manual, sharp debridement. Instrumentation: Nail nipper, rotary burr. Number of Nails: 10  Return in about 3 months (around 07/17/2023) for [facility].         Corinna Gab, DPM [facility] / [facility]

## 2023-06-03 ENCOUNTER — Ambulatory Visit: Payer: Medicare HMO | Admitting: Internal Medicine

## 2023-06-03 VITALS — BP 122/70 | HR 77 | Ht 66.0 in | Wt 259.2 lb

## 2023-06-03 DIAGNOSIS — Z7985 Long-term (current) use of injectable non-insulin antidiabetic drugs: Secondary | ICD-10-CM | POA: Diagnosis not present

## 2023-06-03 DIAGNOSIS — E890 Postprocedural hypothyroidism: Secondary | ICD-10-CM

## 2023-06-03 DIAGNOSIS — E1159 Type 2 diabetes mellitus with other circulatory complications: Secondary | ICD-10-CM

## 2023-06-03 DIAGNOSIS — E785 Hyperlipidemia, unspecified: Secondary | ICD-10-CM

## 2023-06-03 LAB — POCT GLYCOSYLATED HEMOGLOBIN (HGB A1C): Hemoglobin A1C: 6.1 % — AB (ref 4.0–5.6)

## 2023-06-03 MED ORDER — REPATHA 140 MG/ML ~~LOC~~ SOSY: 140.0000 mg | PREFILLED_SYRINGE | SUBCUTANEOUS | 3 refills | Status: DC

## 2023-06-03 MED ORDER — SEMAGLUTIDE (1 MG/DOSE) 4 MG/3ML ~~LOC~~ SOPN: 1.0000 mg | PEN_INJECTOR | SUBCUTANEOUS | 3 refills | Status: DC

## 2023-06-03 NOTE — Patient Instructions (Signed)
Increase  Ozempic 1  mg once weekly     Take Half a tablet of levothyroxine 88 mcg  on Sundays and 1 tablet Monday through Saturday

## 2023-06-03 NOTE — Progress Notes (Unsigned)
Name: Marie Chen  Age/ Sex: 62 y.o., female   MRN/ DOB: [id number], 01/31/1961     PCP: [name], [name]   Reason for Endocrinology Evaluation: Type 2 Diabetes Mellitus  Initial Endocrine Consultative Visit: 10/19/2019    PATIENT IDENTIFIER: Marie Chen is a 62 y.o. 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 The patient has followed with Endocrinology clinic since 10/19/2019 for consultative assistance with management of her diabetes.  DIABETIC HISTORY:  Marie Chen was diagnosed with DM in 2015. Pt endorses genital skin irritation secondary to Metformin ?Marcelline Deist caused genital infections. Her hemoglobin A1c has ranged from 8.8% in 03/2019 , peaking at 10.5% in 2021.  On her initial visit to  Our clinic her A1c 9.0% . We adjust MDI regimen    S/P sleeve gastrectomy 10/03/2020, her A1c was 5.4 % and we stopped insulin    Started Ozempic 12/2022 with an A1c of 6.8%, patient does have a history of pancreatitis and opted to proceed with Ozempic despite the increased risk of pancreatitis due to the benefits outweighing the risk     THYROID HISTORY:  In 1994 she had RAI ablation secondary to hyperthyroidism. She needed LT-4 replacement shortly after RAI ablation .    Mother with cushing syndrome     SUBJECTIVE:   During the last visit (01/09/2023): A1c 6.8 % .     Today (06/03/2023): Marie Chen is here for a follow up on diabetes management.  She checks her blood sugars occasionally.   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   Weight continues to fluctuate Has occasional local neck swelling  Denies palpitations Denies diarrhea or constipation  Denies nausea or vomiting  She has dryness       HOME ENDOCRINE  REGIMEN:  Ozempic 0.5 mg weekly  Levothyroxine 88 mcg , half a tablet on Sundays and 1 tab rest of the week  Repatha 140 mg Q 14 days     Statin: yes ACE-I/ARB: yes   METER DOWNLOAD SUMMARY: 12/19-12/21/2024 Fingerstick Blood Glucose Tests = 11 Average Number Tests/Day = 0.8 Overall Mean FS Glucose = 108   BG Ranges: Low = 96 High = 150  BG Target % Results: % In target = 100 % Over target = 0 % Under target = 0  Hypoglycemic Events/30 Days: BG < 50 = 0 Episodes of symptomatic severe hypoglycemia = 0    DIABETIC COMPLICATIONS: Microvascular complications:  Neuropathy  Denies: CKD, retinopathy  Last eye exam: scheduled 07/2022   Macrovascular complications:  CAD ( medically treated per pt)  Denies: CAD, PVD, CVA   HISTORY:  Past Medical History:  Past Medical History:  Diagnosis Date   Acute hypoxemic respiratory failure due to severe acute respiratory syndrome coronavirus 2 (SARS-CoV-2) disease (HCC) 06/18/2019   Acute pharyngitis 05/23/2009   Qualifier: Diagnosis of  By: Felicity Coyer MD, Vikki Ports A    Allergic rhinitis 03/26/2007   Qualifier: Diagnosis of  By: Jonny Ruiz MD, Len Blalock   Formatting of this note might be different from the original. Overview:  Qualifier: Diagnosis of  By: Jonny Ruiz MD, Len Blalock   Angina pectoris (HCC) 12/04/2018   Asthma    severe   Asthma with exacerbation 03/26/2007   Formatting of this note might be different from the original.  Overview:  Qualifier: Diagnosis of  By: Jonny Ruiz MD, Len Blalock   ASTHMATIC BRONCHITIS, ACUTE 04/27/2008   Qualifier: Diagnosis of  By: Jonny Ruiz MD, Len Blalock    Bronchitis, chronic (HCC) 04/22/2008   Qualifier: Diagnosis of  By: Thurnell Lose of this note might be different from the original. Overview:  Qualifier: Diagnosis of  By: Alesia Richards   CAD (coronary artery disease) 01/01/2019   Carpal tunnel syndrome of left wrist 12/30/2019   Chronic obstructive pulmonary disease (HCC) 06/19/2019    Chronic pain syndrome 04/25/2017   Community acquired pneumonia 01/15/2020   Coronary artery disease    Cough with hemoptysis 05/11/2018   Depression    DEPRESSION 01/05/2007   Qualifier: Diagnosis of  By: Jonny Ruiz MD, Len Blalock    Diabetes mellitus (HCC) 10/07/2020   Diabetes mellitus due to underlying condition with unspecified complications (HCC) 03/26/2007   Qualifier: Diagnosis of  By: Jonny Ruiz MD, Len Blalock    Diabetes mellitus in remission (HCC) 07/03/2021   Diabetes mellitus type 2 in obese 05/12/2018   Diabetes mellitus without complication (HCC)    type 2   DSORD CIRCADIAN RHY SHFT WRK SLEEP PHASE TYPE 03/26/2007   Qualifier: Diagnosis of  By: Jonny Ruiz MD, Len Blalock   Formatting of this note might be different from the original. Overview:  Qualifier: Diagnosis of  By: Jonny Ruiz MD, Len Blalock   Dyslipidemia 10/19/2019   DYSPHAGIA 04/27/2008   Qualifier: Diagnosis of  By: Marina Goodell MD, Wilhemina Bonito    Dyspnea    with exertion   Essential hypertension 01/05/2007   Qualifier: Diagnosis of  By: Jonny Ruiz MD, Len Blalock    Fatigue 07/03/2021   Fatty liver    Fibromyalgia    GERD 01/05/2007   Qualifier: Diagnosis of  By: Jonny Ruiz MD, Len Blalock    GERD (gastroesophageal reflux disease)    HAMMER TOE 01/05/2007   Qualifier: Diagnosis of  By: Jonny Ruiz MD, Len Blalock    Headache 02/24/2009   Qualifier: Diagnosis of  By: Jonny Ruiz MD, Len Blalock   Hemoptysis 05/11/2018   Hyperlipidemia 01/05/2007   diet controlled Formatting of this note might be different from the original. Overview:  Qualifier: Diagnosis of  By: Jonny Ruiz MD, Len Blalock   Hypertension    Hypothyroidism    Inflammatory arthritis 07/22/2020   IRRITABLE BOWEL SYNDROME, HX OF 03/26/2007   Qualifier: Diagnosis of  By: Jonny Ruiz MD, Len Blalock    Lumbar facet arthropathy 04/25/2017   Lumbar radiculopathy 09/19/2017   Migraine headache 04/22/2008   Formatting of this note might be different from the original. Overview:  Qualifier: Diagnosis of  By: Alesia Richards   Mixed dyslipidemia  01/05/2007   Qualifier: Diagnosis of  By: Jonny Ruiz MD, Len Blalock    Morbid obesity (HCC) 12/04/2018   Morbid obesity with BMI of 50.0-59.9, adult (HCC) 09/23/2019   Numbness 12/04/2019   Obesity (BMI 35.0-39.9 without comorbidity) 10/30/2021   Old disruption of left lateral collateral ligament 12/04/2019   Osteoarthritis 07/22/2020   Pain from implanted hardware 08/01/2017   Pain in left shoulder 07/13/2020   Pain in unspecified knee 03/04/2017   PEPTIC ULCER DISEASE 03/26/2007   Qualifier: Diagnosis of   By: Jonny Ruiz MD, Len Blalock     Replacing diagnoses that were inactivated after the 09/10/22 regulatory import   PERSONAL HISTORY ENDOCRN METABOLIC&IMMUNITY D/O 04/22/2008   Qualifier: Diagnosis of  By: Alesia Richards     Plantar callus 08/01/2017  Plantar fasciitis 08/01/2017   Pre-operative cardiovascular examination 07/25/2020   Pre-ulcerative corn or callous 01/30/2018   Preop cardiovascular exam 01/22/2020   Primary osteoarthritis of both hips 04/25/2017   Primary osteoarthritis of both knees 04/25/2017   RESTLESS LEG SYNDROME, HX OF 03/26/2007   Qualifier: Diagnosis of  By: Jonny Ruiz MD, Len Blalock    S/P laparoscopic sleeve gastrectomy 10/12/2020   Segmental dysfunction of lumbar region 04/25/2017   SINUSITIS- ACUTE-NOS 02/24/2009   Qualifier: Diagnosis of  By: Jonny Ruiz MD, Len Blalock    Sjogren's disease [facility])    Sleep apnea    CPAP nightly   Sleep apnea, obstructive 01/05/2007   Qualifier: Diagnosis of  By: Jonny Ruiz MD, Len Blalock   Formatting of this note might be different from the original. Overview:  Qualifier: Diagnosis of  By: Jonny Ruiz MD, Len Blalock   Thoracic spine pain 11/13/2017   Thyroid disease    Trigger finger, left little finger 12/30/2019   Trochanteric bursitis of left hip 03/02/2018   Type 2 diabetes mellitus with diabetic polyneuropathy, with long-term current use of insulin (HCC) 01/26/2020   Type 2 diabetes mellitus with hyperglycemia, without long-term current use of insulin (HCC)  10/19/2019   Vitamin D deficiency, unspecified 08/25/2018   Past Surgical History:  Past Surgical History:  Procedure Laterality Date   ANKLE SURGERY  2010   BACK SURGERY     CARPAL TUNNEL RELEASE Bilateral    CHOLECYSTECTOMY     COLONOSCOPY  04/2014   FOOT SURGERY     bunion surgery bialteral   HYSTEROSCOPY     LEFT HEART CATH AND CORONARY ANGIOGRAPHY N/A 12/19/2018   Procedure: LEFT HEART CATH AND CORONARY ANGIOGRAPHY;  Surgeon: Yvonne Kendall, MD;  Location: [facility];  Service: Cardiovascular;  Laterality: N/A;   LEFT HEART CATH AND CORONARY ANGIOGRAPHY N/A 02/16/2021   Procedure: LEFT HEART CATH AND CORONARY ANGIOGRAPHY;  Surgeon: Yvonne Kendall, MD;  Location: [facility];  Service: Cardiovascular;  Laterality: N/A;   LIGAMENT REPAIR Left 02/04/2020   Procedure: REPAIR RADIAL COLLATERAL LIGAMENT METACARPAL PHALANGEAL LEFT SMALL FINGER; POSSIBLE PINNING;  Surgeon: Cindee Salt, MD;  Location: [facility] OR;  Service: Orthopedics;  Laterality: Left;  AXILLARY BLOCK   RESECTION DISTAL CLAVICAL Right    STERIOD INJECTION Left 02/04/2020   Procedure: INJECTION LEFT CARPAL TUNNEL;  Surgeon: Cindee Salt, MD;  Location: [facility] OR;  Service: Orthopedics;  Laterality: Left;   TUBAL LIGATION     UPPER GI ENDOSCOPY  04/2014   WISDOM TOOTH EXTRACTION     Social History:  reports that she quit smoking about 3 years ago. Her smoking use included cigarettes. She has never used smokeless tobacco. She reports that she does not drink alcohol and does not use drugs. Family History:  Family History  Problem Relation Age of Onset   Heart failure Father    Colon cancer Neg Hx      HOME MEDICATIONS: Allergies as of 06/03/2023       Reactions   Macrodantin [nitrofurantoin Macrocrystal] Rash   Metformin And Related Other (See Comments)   Causes yeast infections   Atorvastatin Diarrhea   Hydromorphone Hcl Nausea And Vomiting   Lipitor [atorvastatin Calcium] Diarrhea   Meperidine Hcl Nausea  Only   Is ok if given with Phenergan   Tetracyclines & Related Rash        Medication List        Accurate as of June 03, 2023 10:51 AM. If you have any  questions, ask your nurse or doctor.          Accu-Chek Aviva Plus test strip Generic drug: glucose blood USE AS INSTRUCTED TO TEST BLOOD SUGAR UP TO 3 TIMES DAILY   OneTouch Verio test strip Generic drug: glucose blood Check blood sugar 3 times daily   Accu-Chek Aviva Plus w/Device Kit Check blood sugar 3 times daily   Accu-Chek FastClix Lancets Misc Check blood sugars 3 times daily   albuterol (2.5 MG/3ML) 0.083% nebulizer solution Commonly known as: PROVENTIL Take 2.5 mg by nebulization every 6 (six) hours as needed for wheezing or shortness of breath.   calcium carbonate 500 MG chewable tablet Commonly known as: TUMS - dosed in mg elemental calcium Chew 1 tablet by mouth 3 (three) times daily.   ciclopirox 8 % solution Commonly known as: PENLAC Apply topically at bedtime. Apply over nail and surrounding skin. Apply daily over previous coat. After seven (7) days, may remove with alcohol and continue cycle.   cyclobenzaprine 10 MG tablet Commonly known as: FLEXERIL Take 10 mg by mouth 2 (two) times daily as needed for muscle spasms.   docusate sodium 100 MG capsule Commonly known as: Colace Take 1 capsule (100 mg total) by mouth 2 (two) times daily.   DropSafe Alcohol Prep 70 % Pads USE AS DIRECTED   escitalopram 20 MG tablet Commonly known as: LEXAPRO Take 20 mg by mouth at bedtime.   fluticasone 50 MCG/ACT nasal spray Commonly known as: FLONASE Place 1 spray into both nostrils daily as needed for allergies.   hydrochlorothiazide 25 MG tablet Commonly known as: HYDRODIURIL Take 25 mg by mouth daily.   insulin lispro 100 UNIT/ML KwikPen Commonly known as: HumaLOG KwikPen Max daily 15 units   Insulin Pen Needle 32G X 4 MM Misc 1 Device by Does not apply route 3 (three) times daily.    ketoconazole 2 % cream Commonly known as: NIZORAL Apply 1 Application topically daily.   levothyroxine 88 MCG tablet Commonly known as: SYNTHROID Take Half a tablet of levothyroxine 88 mcg  on Sundays and 1 tablet Monday through Saturday   lisinopril 10 MG tablet Commonly known as: ZESTRIL Take 10 mg by mouth daily.   LUBRICATING EYE DROPS OP Place 1 drop into both eyes daily as needed for dry eyes (dry eyes).   montelukast 10 MG tablet Commonly known as: SINGULAIR Take 10 mg by mouth daily.   nitroGLYCERIN 0.4 MG SL tablet Commonly known as: NITROSTAT Place 1 tablet (0.4 mg total) under the tongue every 5 (five) minutes as needed for chest pain.   nystatin powder Commonly known as: MYCOSTATIN/NYSTOP Apply 1 application topically 2 (two) times daily as needed for dry skin (dry skin).   omeprazole 40 MG capsule Commonly known as: PRILOSEC Take 1 capsule by mouth daily.   oxycodone-acetaminophen 10-300 MG tablet Commonly known as: LYNOX Take 1 tablet by mouth every 6 (six) hours as needed for pain.   pregabalin 150 MG capsule Commonly known as: LYRICA Take 150 mg by mouth 3 (three) times daily.   Repatha 140 MG/ML Sosy Generic drug: Evolocumab 140 mg by Other route every 14 (fourteen) days.   Semaglutide(0.25 or 0.5MG /DOS) 2 MG/3ML Sopn Inject 0.5 mg into the skin once a week.   Sunosi 150 MG Tabs Generic drug: Solriamfetol HCl Take 1 tablet by mouth every morning.   terconazole 0.4 % vaginal cream Commonly known as: TERAZOL 7 Place 1 applicator vaginally at bedtime as needed for other (yeast infections).  Yeast infection   topiramate 25 MG capsule Commonly known as: TOPAMAX Take 50 mg by mouth 2 (two) times daily.   Trelegy Ellipta 100-62.5-25 MCG/INH Aepb Generic drug: Fluticasone-Umeclidin-Vilant Inhale 1 puff into the lungs daily.   Vitamin D (Ergocalciferol) 1.25 MG (50000 UNIT) Caps capsule Commonly known as: DRISDOL Take 50,000 Units by mouth  every Saturday.   ZTlido 1.8 % Ptch Generic drug: Lidocaine Apply 1 patch topically 3 (three) times daily.         OBJECTIVE:   Vital Signs: BP 122/70   Pulse 77   Ht 5\' 6"  (1.676 m)   Wt 259 lb 3.2 oz (117.6 kg)   LMP  (LMP Unknown)   SpO2 94%   BMI 41.84 kg/m   Wt Readings from Last 3 Encounters:  06/03/23 259 lb 3.2 oz (117.6 kg)  01/09/23 257 lb (116.6 kg)  12/08/22 255 lb (115.7 kg)     Exam: General: Pt appears well and is in NAD  Lungs: Clear with good BS bilat   Heart: RRR   Extremities: Trace  pretibial edema.   Neuro: MS is good with appropriate affect, pt is alert and Ox3   DM Foot Exam: 04/16/2023 per podiatry    DATA REVIEWED:  Lab Results  Component Value Date   HGBA1C 6.8 (A) 01/09/2023   HGBA1C 5.8 (A) 07/09/2022   HGBA1C 6.1 (A) 01/03/2022    Latest Reference Range & Units 01/09/23 10:57  Sodium 135 - 145 mEq/L 139  Potassium 3.5 - 5.1 mEq/L 3.9  Chloride 96 - 112 mEq/L 103  CO2 19 - 32 mEq/L 29  Glucose 70 - 99 mg/dL 160 (H)  BUN 6 - 23 mg/dL 17  Creatinine 1.09 - 3.23 mg/dL 5.57  Calcium 8.4 - 32.2 mg/dL 9.4  Alkaline Phosphatase 39 - 117 U/L 85  Albumin 3.5 - 5.2 g/dL 3.7  AST 0 - 37 U/L 12  ALT 0 - 35 U/L 8  Total Protein 6.0 - 8.3 g/dL 7.1  Total Bilirubin 0.2 - 1.2 mg/dL 0.5  GFR >02.54 mL/min 69.71  Total CHOL/HDL Ratio  3  Cholesterol 0 - 200 mg/dL 97  HDL Cholesterol >27.06 mg/dL 23.76 (L)  LDL (calc) 0 - 99 mg/dL 38  MICROALB/CREAT RATIO 0.0 - 30.0 mg/g 6.7  NonHDL  59.44  Triglycerides 0.0 - 149.0 mg/dL 283.1  VLDL 0.0 - 51.7 mg/dL 61.6  VITD 07.37 - 106.26 ng/mL 70.27    Latest Reference Range & Units 01/09/23 10:57  TSH 0.35 - 5.50 uIU/mL 0.59    Latest Reference Range & Units 01/09/23 10:57  Creatinine,U mg/dL 94.8  Microalb, Ur 0.0 - 1.9 mg/dL 5.5 (H)  MICROALB/CREAT RATIO 0.0 - 30.0 mg/g 6.7    ASSESSMENT / PLAN / RECOMMENDATIONS:   1) Postablative Hypothyroidism :  -Patient is clinically thyroid -No  local neck symptoms -TSH is within normal range, but it is at the lower end of normal and has been trending down, will change levothyroxine  Medication : Stop levothyroxine 88 mcg, half a tablet on Sundays and 1 tablet the rest of the week Start levothyroxine 75 mcg daily   2) Type 2 Diabetes Mellitus,Optimally Controlled  A1c 6.1 %  -A1c has trended down to 6.1% - S/P sleeve gastrectomy on 10/03/2020 .  - Pt states she was diagnosed with pancreatitis in 1992, after developing vomiting following EGD, we discussed GLP-1 agonist and the small risk of pancreatitis, after further discussion, patient opted to try Ozempic as the benefit outweighs  the risk -She is intolerant to metformin and SGLT-2 inhibitors.    Medication  Increase Ozempic 1 mg weekly  3) Dyslipidemia :   -She is intolerant to atorvastatin, simvastatin, rosuvastatin, and Zetia due to myalgias -LDL at goal -No changes   Medication Repatha 140 mg q. 14 days    F/U in 6 months     Signed electronically by: Lyndle Herrlich, MD  [facility] Endocrinology  [facility] [address] [place] [place], [place] [address] Phone: [phone] FAX: [phone]   CC: Hanley Ben [address] [place] [address] Phone: [phone]  Fax: [phone]  Return to Endocrinology clinic as below: Future Appointments  Date Time Provider Department Center  07/16/2023  2:45 PM Barbaraann Share, DPM [facility]

## 2023-06-04 LAB — COMPREHENSIVE METABOLIC PANEL
AG Ratio: 1.6 (calc) (ref 1.0–2.5)
ALT: 11 U/L (ref 6–29)
AST: 14 U/L (ref 10–35)
Albumin: 3.8 g/dL (ref 3.6–5.1)
Alkaline phosphatase (APISO): 87 U/L (ref 37–153)
BUN/Creatinine Ratio: 22 (calc) (ref 6–22)
BUN: 27 mg/dL — ABNORMAL HIGH (ref 7–25)
CO2: 29 mmol/L (ref 20–32)
Calcium: 9.2 mg/dL (ref 8.6–10.4)
Chloride: 102 mmol/L (ref 98–110)
Creat: 1.23 mg/dL — ABNORMAL HIGH (ref 0.50–1.05)
Globulin: 2.4 g/dL (ref 1.9–3.7)
Glucose, Bld: 83 mg/dL (ref 65–99)
Potassium: 4 mmol/L (ref 3.5–5.3)
Sodium: 138 mmol/L (ref 135–146)
Total Bilirubin: 0.8 mg/dL (ref 0.2–1.2)
Total Protein: 6.2 g/dL (ref 6.1–8.1)

## 2023-06-04 LAB — TSH: TSH: 0.46 m[IU]/L (ref 0.40–4.50)

## 2023-06-06 MED ORDER — LEVOTHYROXINE SODIUM 75 MCG PO TABS
75.0000 ug | ORAL_TABLET | Freq: Every day | ORAL | 3 refills | Status: DC
Start: 2023-06-06 — End: 2023-12-03

## 2023-06-28 ENCOUNTER — Encounter: Payer: Self-pay | Admitting: Internal Medicine

## 2023-07-16 ENCOUNTER — Encounter: Payer: Self-pay | Admitting: Podiatry

## 2023-07-16 ENCOUNTER — Ambulatory Visit: Payer: Medicare HMO | Admitting: Podiatry

## 2023-07-16 ENCOUNTER — Ambulatory Visit (INDEPENDENT_AMBULATORY_CARE_PROVIDER_SITE_OTHER): Payer: 59 | Admitting: Podiatry

## 2023-07-16 DIAGNOSIS — E1142 Type 2 diabetes mellitus with diabetic polyneuropathy: Secondary | ICD-10-CM

## 2023-07-16 DIAGNOSIS — M79674 Pain in right toe(s): Secondary | ICD-10-CM

## 2023-07-16 DIAGNOSIS — B351 Tinea unguium: Secondary | ICD-10-CM

## 2023-07-16 DIAGNOSIS — M79675 Pain in left toe(s): Secondary | ICD-10-CM | POA: Diagnosis not present

## 2023-07-16 DIAGNOSIS — L84 Corns and callosities: Secondary | ICD-10-CM | POA: Diagnosis not present

## 2023-07-16 NOTE — Progress Notes (Signed)
  Subjective:  Patient ID: Marie Chen, female    DOB: [birth year],  MRN: [id number]  Chief Complaint  Patient presents with   [facility]    [facility] with callous with Lat A1c 6.1 in Dec, which is also the last time she seen her PCP, Takes ASA 71.     63 y.o. female presents with the above complaint. History confirmed with patient. Patient presenting with pain related to dystrophic thickened elongated nails. Patient is unable to trim own nails related to nail dystrophy and/or mobility issues. Patient does does have a history of T2DM.  Patient does have painful hyperkeratotic callus interdigital 2nd toe medial aspect bilateral  Objective:  Physical Exam: warm, good capillary refill nail exam onychomycosis of the toenails, onycholysis, and dystrophic nails DP pulses palpable, PT pulses palpable, and protective sensation absent Left Foot:  Pain with palpation of nails due to elongation and dystrophic growth.  HAV deformity and hammertoe deformity with transverse plane deviation of the toe.  Hyperkeratotic callus without underlying ulceration present interdigital second toe PIPJ level Right Foot: Pain with palpation of nails due to elongation and dystrophic growth. HAV deformity and hammertoe deformity with transverse plane deviation of the toe. Hyperkeratotic callus without underlying ulceration present interdigital second toe PIPJ level.  Minimal hyperkeratotic tissue subfirst metatarsal head present today.  Assessment:   1. Pain due to onychomycosis of toenails of both feet   2. Pre-ulcerative calluses   3. Diabetic polyneuropathy associated with type 2 diabetes mellitus (HCC)        Plan:  Patient was evaluated and treated and all questions answered.  # Preulcerative callus bilateral foot, second toes at the level of proximal interphalangeal joints medial aspect All symptomatic hyperkeratoses x 2 were safely debrided with a sterile #15 blade to patient's level of comfort without incident. We  discussed preventative and palliative care of these lesions including supportive and accommodative shoegear, padding, prefabricated and custom molded accommodative orthoses, use of a pumice stone and lotions/creams daily.  #Onychomycosis with pain  -Nails palliatively debrided as below. -Educated on self-care  Procedure: Nail Debridement Rationale: Pain Type of Debridement: manual, sharp debridement. Instrumentation: Nail nipper, rotary burr. Number of Nails: 10  Return in about 3 months (around 10/13/2023) for Diabetic Foot Care.         Ethan LITTIE Saddler, DPM [facility]

## 2023-07-19 ENCOUNTER — Ambulatory Visit (INDEPENDENT_AMBULATORY_CARE_PROVIDER_SITE_OTHER): Payer: 59

## 2023-07-19 ENCOUNTER — Ambulatory Visit (INDEPENDENT_AMBULATORY_CARE_PROVIDER_SITE_OTHER): Payer: 59 | Admitting: Podiatry

## 2023-07-19 DIAGNOSIS — M778 Other enthesopathies, not elsewhere classified: Secondary | ICD-10-CM

## 2023-07-19 DIAGNOSIS — M19071 Primary osteoarthritis, right ankle and foot: Secondary | ICD-10-CM

## 2023-07-19 DIAGNOSIS — M109 Gout, unspecified: Secondary | ICD-10-CM | POA: Diagnosis not present

## 2023-07-19 MED ORDER — MELOXICAM 7.5 MG PO TABS: 7.5000 mg | ORAL_TABLET | Freq: Every day | ORAL | 0 refills | Status: DC

## 2023-07-19 MED ORDER — COLCHICINE 0.6 MG PO TABS: 0.6000 mg | ORAL_TABLET | Freq: Two times a day (BID) | ORAL | 0 refills | Status: AC

## 2023-07-19 NOTE — Progress Notes (Signed)
 Chief Complaint  Patient presents with   Foot Pain    Right foot at the bunion area. She has had corrective sx in the past. Bunion feels like a tooth ache in her foot. Throbbing and aching. Diabetic last A1c 6.2 in Dec when she seen PCP last. Takes ASA 33   HPI: 63 y.o. female presents today with concern of pain in the right great toe joint and a mild amount of pain on the dorsal midfoot of the right foot.  She had previous bunion surgery by Dr. Burt.  She does have a history of gout.  Denies redness or swelling to the area.  She notes the pain is fairly severe.  Past Medical History:  Diagnosis Date   Acute hypoxemic respiratory failure due to severe acute respiratory syndrome coronavirus 2 (SARS-CoV-2) disease (HCC) 06/18/2019   Acute pharyngitis 05/23/2009   Qualifier: Diagnosis of  By: Inocencio MD, Berwyn A    Allergic rhinitis 03/26/2007   Qualifier: Diagnosis of  By: Norleen MD, Lynwood ORN   Formatting of this note might be different from the original. Overview:  Qualifier: Diagnosis of  By: Norleen MD, Lynwood ORN   Angina pectoris (HCC) 12/04/2018   Asthma    severe   Asthma with exacerbation 03/26/2007   Formatting of this note might be different from the original. Overview:  Qualifier: Diagnosis of  By: Norleen MD, Lynwood ORN   ASTHMATIC BRONCHITIS, ACUTE 04/27/2008   Qualifier: Diagnosis of  By: Norleen MD, Lynwood ORN    Bronchitis, chronic (HCC) 04/22/2008   Qualifier: Diagnosis of  By: Claudene GUSS Heron Elyn of this note might be different from the original. Overview:  Qualifier: Diagnosis of  By: Claudene GUSS Heron   CAD (coronary artery disease) 01/01/2019   Carpal tunnel syndrome of left wrist 12/30/2019   Chronic obstructive pulmonary disease (HCC) 06/19/2019   Chronic pain syndrome 04/25/2017   Community acquired pneumonia 01/15/2020   Coronary artery disease    Cough with hemoptysis 05/11/2018   Depression    DEPRESSION 01/05/2007   Qualifier: Diagnosis of  By: Norleen MD,  Lynwood ORN    Diabetes mellitus (HCC) 10/07/2020   Diabetes mellitus due to underlying condition with unspecified complications (HCC) 03/26/2007   Qualifier: Diagnosis of  By: Norleen MD, Lynwood ORN    Diabetes mellitus in remission (HCC) 07/03/2021   Diabetes mellitus type 2 in obese 05/12/2018   Diabetes mellitus without complication (HCC)    type 2   DSORD CIRCADIAN RHY SHFT WRK SLEEP PHASE TYPE 03/26/2007   Qualifier: Diagnosis of  By: Norleen MD, Lynwood ORN   Formatting of this note might be different from the original. Overview:  Qualifier: Diagnosis of  By: Norleen MD, Lynwood ORN   Dyslipidemia 10/19/2019   DYSPHAGIA 04/27/2008   Qualifier: Diagnosis of  By: Abran MD, Norleen SAILOR    Dyspnea    with exertion   Essential hypertension 01/05/2007   Qualifier: Diagnosis of  By: Norleen MD, Lynwood ORN    Fatigue 07/03/2021   Fatty liver    Fibromyalgia    GERD 01/05/2007   Qualifier: Diagnosis of  By: Norleen MD, Lynwood ORN    GERD (gastroesophageal reflux disease)    HAMMER TOE 01/05/2007   Qualifier: Diagnosis of  By: Norleen MD, Lynwood ORN    Headache 02/24/2009   Qualifier: Diagnosis of  By: Norleen MD, Lynwood ORN   Hemoptysis 05/11/2018   Hyperlipidemia 01/05/2007   diet controlled  Formatting of this note might be different from the original. Overview:  Qualifier: Diagnosis of  By: Norleen MD, Lynwood ORN   Hypertension    Hypothyroidism    Inflammatory arthritis 07/22/2020   IRRITABLE BOWEL SYNDROME, HX OF 03/26/2007   Qualifier: Diagnosis of  By: Norleen MD, Lynwood ORN    Lumbar facet arthropathy 04/25/2017   Lumbar radiculopathy 09/19/2017   Migraine headache 04/22/2008   Formatting of this note might be different from the original. Overview:  Qualifier: Diagnosis of  By: Claudene GUSS Railing   Mixed dyslipidemia 01/05/2007   Qualifier: Diagnosis of  By: Norleen MD, Lynwood ORN    Morbid obesity (HCC) 12/04/2018   Morbid obesity with BMI of 50.0-59.9, adult (HCC) 09/23/2019   Numbness 12/04/2019   Obesity (BMI 35.0-39.9 without  comorbidity) 10/30/2021   Old disruption of left lateral collateral ligament 12/04/2019   Osteoarthritis 07/22/2020   Pain from implanted hardware 08/01/2017   Pain in left shoulder 07/13/2020   Pain in unspecified knee 03/04/2017   PEPTIC ULCER DISEASE 03/26/2007   Qualifier: Diagnosis of   By: Norleen MD, Lynwood ORN     Replacing diagnoses that were inactivated after the 09/10/22 regulatory import   PERSONAL HISTORY ENDOCRN METABOLIC&IMMUNITY D/O 04/22/2008   Qualifier: Diagnosis of  By: Claudene GUSS, Barbara     Plantar callus 08/01/2017   Plantar fasciitis 08/01/2017   Pre-operative cardiovascular examination 07/25/2020   Pre-ulcerative corn or callous 01/30/2018   Preop cardiovascular exam 01/22/2020   Primary osteoarthritis of both hips 04/25/2017   Primary osteoarthritis of both knees 04/25/2017   RESTLESS LEG SYNDROME, HX OF 03/26/2007   Qualifier: Diagnosis of  By: Norleen MD, Lynwood ORN    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 Overview:  Qualifier: Diagnosis of  By: Norleen MD, Lynwood ORN   Thoracic spine pain 11/13/2017   Thyroid  disease    Trigger finger, left little finger 12/30/2019   Trochanteric bursitis of left hip 03/02/2018   Type 2 diabetes mellitus with diabetic polyneuropathy, with long-term current use of insulin  (HCC) 01/26/2020   Type 2 diabetes mellitus with hyperglycemia, without long-term current use of insulin  (HCC) 10/19/2019   Vitamin D  deficiency, unspecified 08/25/2018    Past Surgical History:  Procedure Laterality Date   ANKLE SURGERY  2010   BACK SURGERY     CARPAL TUNNEL RELEASE Bilateral    CHOLECYSTECTOMY      COLONOSCOPY  04/2014   FOOT SURGERY     bunion surgery bialteral   HYSTEROSCOPY     LEFT HEART CATH AND CORONARY ANGIOGRAPHY N/A 12/19/2018   Procedure: LEFT HEART CATH AND CORONARY ANGIOGRAPHY;  Surgeon: Mady Bruckner, MD;  Location: [facility];  Service: Cardiovascular;  Laterality: N/A;   LEFT HEART CATH AND CORONARY ANGIOGRAPHY N/A 02/16/2021   Procedure: LEFT HEART CATH AND CORONARY ANGIOGRAPHY;  Surgeon: Mady Bruckner, MD;  Location: [facility];  Service: Cardiovascular;  Laterality: N/A;   LIGAMENT REPAIR Left 02/04/2020   Procedure: REPAIR RADIAL COLLATERAL LIGAMENT METACARPAL PHALANGEAL LEFT SMALL FINGER; POSSIBLE PINNING;  Surgeon: Murrell Kuba, MD;  Location:  [facility] OR;  Service: Orthopedics;  Laterality: Left;  AXILLARY BLOCK   RESECTION DISTAL CLAVICAL Right    STERIOD INJECTION Left 02/04/2020   Procedure: INJECTION LEFT CARPAL TUNNEL;  Surgeon: Murrell Kuba, MD;  Location: [facility] OR;  Service: Orthopedics;  Laterality: Left;   TUBAL LIGATION     UPPER GI ENDOSCOPY  04/2014   WISDOM TOOTH EXTRACTION      Allergies  Allergen Reactions   Macrodantin [Nitrofurantoin Macrocrystal] Rash   Metformin And Related Other (See Comments)    Causes yeast infections   Atorvastatin Diarrhea   Hydromorphone Hcl Nausea And Vomiting   Lipitor [Atorvastatin Calcium] Diarrhea   Meperidine Hcl Nausea Only    Is ok if given with Phenergan    Tetracyclines & Related Rash    Review of Systems  Musculoskeletal:  Positive for joint pain.      Physical Exam: Palpable pedal pulses on the right foot.  There is pain on palpation to the right first MPJ and with range of motion of the right first MPJ.  No crepitus with range of motion noted.  Mild discomfort on palpation of the dorsal tarsometatarsal joints.  No erythema or edema is appreciated.  No ecchymosis is noted.  Epicritic sensation is intact.  Manual muscle testing 5/5  Radiographic Exam (right foot, 3 weightbearing views,  07/19/2023):  Normal osseous mineralization.  There is evidence of previous bunion correction with a headless screw in the first metatarsal distal aspect, and is stable in the first proximal phalanx.  These are in good position.  There is joint space narrowing at the first MPJ.  No fracture seen.  No erosive findings at the first MPJ.  Assessment/Plan of Care: 1. Arthritis of right foot   2. Capsulitis of right foot   3. Acute gout of right foot, unspecified cause      Meds ordered this encounter  Medications   meloxicam  (MOBIC ) 7.5 MG tablet    Sig: Take 1 tablet (7.5 mg total) by mouth daily. Take this for the first few days to see if it stops your foot pain.  If no improvement, switch over to the colchicine .    Dispense:  20 tablet    Refill:  0   colchicine  0.6 MG tablet    Sig: Take 1 tablet (0.6 mg total) by mouth 2 (two) times daily. Stop taking the medication once your symptoms improve.    Dispense:  20 tablet    Refill:  0   Discussed clinical and radiographic findings with patient today.  Offered a cortisone injection to the area but the patient deferred at this time.  Will send in 2 prescriptions, one for meloxicam  and the other for colchicine .  She will start with the meloxicam  while we await blood work that is going to be ordered today.  Will order a serum uric acid, sed rate, and CBC with differential.  If her uric acid comes back I will call her and have her switch over to the colchicine .  Informed the patient that this seems more arthritic than gouty pain at this time due to clinical and radiographic findings.  Will await the blood work.  Follow-up within the next 2 weeks if she is having continued pain.   Awanda CHARM Imperial, DPM, FACFAS [facility]     [address]  [place], [place] [address]                Office [phone]  Fax [phone]

## 2023-07-20 LAB — CBC WITH DIFFERENTIAL/PLATELET
Basophils Absolute: 0.1 10*3/uL (ref 0.0–0.2)
Basos: 1 %
EOS (ABSOLUTE): 0.2 10*3/uL (ref 0.0–0.4)
Eos: 2 %
Hematocrit: 46.6 % (ref 34.0–46.6)
Hemoglobin: 15.2 g/dL (ref 11.1–15.9)
Immature Grans (Abs): 0 10*3/uL (ref 0.0–0.1)
Immature Granulocytes: 0 %
Lymphocytes Absolute: 2.7 10*3/uL (ref 0.7–3.1)
Lymphs: 35 %
MCH: 30.5 pg (ref 26.6–33.0)
MCHC: 32.6 g/dL (ref 31.5–35.7)
MCV: 93 fL (ref 79–97)
Monocytes Absolute: 0.5 10*3/uL (ref 0.1–0.9)
Monocytes: 7 %
Neutrophils Absolute: 4.1 10*3/uL (ref 1.4–7.0)
Neutrophils: 55 %
Platelets: 307 10*3/uL (ref 150–450)
RBC: 4.99 x10E6/uL (ref 3.77–5.28)
RDW: 12.1 % (ref 11.7–15.4)
WBC: 7.6 10*3/uL (ref 3.4–10.8)

## 2023-07-20 LAB — SEDIMENTATION RATE: Sed Rate: 5 mm/h (ref 0–40)

## 2023-07-20 LAB — URIC ACID: Uric Acid: 5.8 mg/dL (ref 3.0–7.2)

## 2023-07-21 ENCOUNTER — Encounter: Payer: Self-pay | Admitting: Podiatry

## 2023-07-24 ENCOUNTER — Telehealth: Payer: Self-pay

## 2023-07-24 ENCOUNTER — Other Ambulatory Visit (HOSPITAL_COMMUNITY): Payer: Self-pay

## 2023-07-24 NOTE — Telephone Encounter (Signed)
Pharmacy Patient Advocate Encounter   Received notification from  Onbase  that prior authorization for Ozempic (1 MG/DOSE) 4MG /3ML pen-injectors is required/requested.   Insurance verification completed.   The patient is insured through [facility] .   Per test claim: PA required; PA submitted to above mentioned insurance via CoverMyMeds Key/confirmation #/EOC [facility] Status is pending

## 2023-07-24 NOTE — Telephone Encounter (Signed)
Pharmacy Patient Advocate Encounter  Received notification from [facility] that Prior Authorization for Ozempic has been APPROVED from 07/24/2023 to 06/10/2024   PA #/Case ID/Reference #: [id number]

## 2023-08-08 ENCOUNTER — Other Ambulatory Visit: Payer: Self-pay | Admitting: Podiatry

## 2023-08-27 ENCOUNTER — Telehealth: Payer: Self-pay

## 2023-08-27 ENCOUNTER — Other Ambulatory Visit (HOSPITAL_COMMUNITY): Payer: Self-pay

## 2023-08-27 NOTE — Telephone Encounter (Signed)
 Pharmacy Patient Advocate Encounter   Received notification from CoverMyMeds that prior authorization for Repatha is required/requested.   Insurance verification completed.   The patient is insured through [facility] .   Per test claim: PA required; PA submitted to above mentioned insurance via CoverMyMeds Key/confirmation #/EOC [id number] Status is pending

## 2023-08-29 ENCOUNTER — Other Ambulatory Visit: Payer: Self-pay | Admitting: Podiatry

## 2023-10-14 ENCOUNTER — Ambulatory Visit (INDEPENDENT_AMBULATORY_CARE_PROVIDER_SITE_OTHER): Payer: 59 | Admitting: Podiatry

## 2023-10-14 ENCOUNTER — Encounter: Payer: Self-pay | Admitting: Podiatry

## 2023-10-14 DIAGNOSIS — E1142 Type 2 diabetes mellitus with diabetic polyneuropathy: Secondary | ICD-10-CM

## 2023-10-14 DIAGNOSIS — L84 Corns and callosities: Secondary | ICD-10-CM

## 2023-10-14 DIAGNOSIS — M79674 Pain in right toe(s): Secondary | ICD-10-CM

## 2023-10-14 DIAGNOSIS — B351 Tinea unguium: Secondary | ICD-10-CM

## 2023-10-14 DIAGNOSIS — M79675 Pain in left toe(s): Secondary | ICD-10-CM

## 2023-10-14 NOTE — Progress Notes (Signed)
  Subjective:  Patient ID: Marie Chen, female    DOB: Jun 14, 1960,  MRN: [id number]  Chief Complaint  Patient presents with   [facility]    [facility] with callous, and the corns between first and second toes.  Last A1c was 6.1 in Dec. No anti coag.     63 y.o. female presents with the above complaint. History confirmed with patient. Patient presenting with pain related to dystrophic thickened elongated nails. Patient is unable to trim own nails related to nail dystrophy and/or mobility issues. Patient does does have a history of T2DM.  Patient does have painful hyperkeratotic callus interdigital 2nd toe medial aspect bilateral.  Reports having prior bunion and hammertoe surgery with Dr. Lois Rink and relates recurrence of the hammertoe deformities.  Last A1c 6.1.  Objective:  Physical Exam: warm, good capillary refill, diminished pedal hair growth, pedal skin atrophic nail exam onychomycosis of the toenails, onycholysis, and thickened dystrophic nails DP pulses palpable, PT pulses palpable, and protective sensation absent Left Foot:  Pain with palpation of nails due to elongation and dystrophic growth.  HAV deformity and hammertoe deformity with transverse plane deviation of the toe.  Hyperkeratotic callus without underlying ulceration present interdigital second toe PIPJ level Right Foot: Pain with palpation of nails due to elongation and dystrophic growth. HAV deformity and hammertoe deformity with transverse plane deviation of the toe. Hyperkeratotic callus without underlying ulceration present interdigital second toe PIPJ level.  Minimal hyperkeratotic tissue subfirst metatarsal head present today.  Assessment:   1. Diabetic polyneuropathy associated with type 2 diabetes mellitus (HCC)   2. Pre-ulcerative calluses   3. Pain due to onychomycosis of toenails of both feet        Plan:  Patient was evaluated and treated and all questions answered.  # Preulcerative callus bilateral foot, second  toes at the level of proximal interphalangeal joints medial aspect All symptomatic hyperkeratoses x 2 were safely debrided with a sterile #15 blade to patient's level of comfort without incident. We discussed preventative and palliative care of these lesions including supportive and accommodative shoegear, padding, prefabricated and custom molded accommodative orthoses, use of a pumice stone and lotions/creams daily. - Toe spacers and accommodative padding dispensed today. - Recurrent bunion and hammertoe deformities contributing to the development of these preulcerative interdigital corns.  #Onychomycosis with pain  -Nails palliatively debrided as below. -Educated on self-care  Procedure: Nail Debridement Rationale: Pain Type of Debridement: manual, sharp debridement. Instrumentation: Nail nipper, rotary burr. Number of Nails: 10  Patient educated on diabetes. Discussed proper diabetic foot care and discussed risks and complications of disease. Educated patient in depth on reasons to return to the office immediately should he/she discover anything concerning or new on the feet. All questions answered. Discussed proper shoes as well.    Return in about 3 months (around 01/14/2024) for Diabetic Foot Care.         Reina Cara, DPM [facility] / [facility]

## 2023-10-14 NOTE — Patient Instructions (Addendum)
 More silicone and felt pads can be purchased from:  [email or web address]   Look for medium or small size gel toe caps, felt 3 layer toe separators or corn pads.  These can also be found on [employer].  Look for urea 40% cream or ointment and apply to the thickened dry skin / calluses. This can be bought over the counter, at a pharmacy or online such as [employer].  You can also scrub the calluses with white vinegar.  This will make it easier for you to gently file them down using an emery board or pumice stone.

## 2023-10-17 NOTE — Telephone Encounter (Signed)
 Pharmacy Patient Advocate Encounter  Received notification from [facility] Medicare Part D  that Prior Authorization for Repatha  140MG /ML syringes has been APPROVED from 08-27-2023 to 02-27-2024   PA #/Case ID/Reference #: [id number]

## 2023-11-05 ENCOUNTER — Other Ambulatory Visit: Payer: Self-pay

## 2023-11-05 MED ORDER — REPATHA 140 MG/ML ~~LOC~~ SOSY: 140.0000 mg | PREFILLED_SYRINGE | SUBCUTANEOUS | 1 refills | Status: DC

## 2023-12-02 ENCOUNTER — Encounter: Payer: Self-pay | Admitting: Internal Medicine

## 2023-12-02 ENCOUNTER — Ambulatory Visit (INDEPENDENT_AMBULATORY_CARE_PROVIDER_SITE_OTHER): Payer: Medicare HMO | Admitting: Internal Medicine

## 2023-12-02 VITALS — BP 124/76 | HR 102 | Ht 66.0 in | Wt 252.0 lb

## 2023-12-02 DIAGNOSIS — E1159 Type 2 diabetes mellitus with other circulatory complications: Secondary | ICD-10-CM

## 2023-12-02 DIAGNOSIS — E785 Hyperlipidemia, unspecified: Secondary | ICD-10-CM | POA: Diagnosis not present

## 2023-12-02 DIAGNOSIS — E890 Postprocedural hypothyroidism: Secondary | ICD-10-CM

## 2023-12-02 DIAGNOSIS — Z7985 Long-term (current) use of injectable non-insulin antidiabetic drugs: Secondary | ICD-10-CM | POA: Diagnosis not present

## 2023-12-02 LAB — TSH: TSH: 4.08 m[IU]/L (ref 0.40–4.50)

## 2023-12-02 LAB — POCT GLYCOSYLATED HEMOGLOBIN (HGB A1C): Hemoglobin A1C: 5.8 % — AB (ref 4.0–5.6)

## 2023-12-02 LAB — T4, FREE: Free T4: 0.8 ng/dL (ref 0.8–1.8)

## 2023-12-02 MED ORDER — OZEMPIC (0.25 OR 0.5 MG/DOSE) 2 MG/3ML ~~LOC~~ SOPN: 0.5000 mg | PEN_INJECTOR | SUBCUTANEOUS | 3 refills | Status: DC

## 2023-12-02 NOTE — Progress Notes (Unsigned)
 Name: Marie Chen  Age/ Sex: 63 y.o., female   MRN/ DOB: [id number], 06-14-1960     PCP: Lenon Homer, MD   Reason for Endocrinology Evaluation: Type 2 Diabetes Mellitus  Initial Endocrine Consultative Visit: 10/19/2019    PATIENT IDENTIFIER: Marie Chen is a 63 y.o. 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 The patient has followed with Endocrinology clinic since 10/19/2019 for consultative assistance with management of her diabetes.  DIABETIC HISTORY:  Marie Chen was diagnosed with DM in 2015. Pt endorses genital skin irritation secondary to Metformin ?Marie Chen caused genital infections. Her hemoglobin A1c has ranged from 8.8% in 03/2019 , peaking at 10.5% in 2021.  On her initial visit to  Our clinic her A1c 9.0% . We adjust MDI regimen    S/P sleeve gastrectomy 10/03/2020, her A1c was 5.4 % and we stopped insulin     Started Ozempic  12/2022 with an A1c of 6.8%, patient does have a history of pancreatitis and opted to proceed with Ozempic  despite the increased risk of pancreatitis due to the benefits outweighing the risk     THYROID  HISTORY:  In 1994 she had RAI ablation secondary to hyperthyroidism. She needed LT-4 replacement shortly after RAI ablation .    Mother with cushing syndrome     SUBJECTIVE:   During the last visit (06/03/2023): A1c 6.1 % .     Today (12/02/2023): Marie Chen is here for a follow up on diabetes management.  She checks her blood sugars occasionally.   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  She was evaluated by podiatry 10/2023     No local neck swelling  Has burning sensation in her throat and epigastric pain  Denies palpitations Denies diarrhea or constipation  Denies  nausea or vomiting     HOME ENDOCRINE REGIMEN:  Ozempic  1 mg weekly  Levothyroxine  75 mcg daily  Repatha  140 mg Q 14 days     Statin: yes ACE-I/ARB: yes   METER DOWNLOAD SUMMARY: 12/19-12/21/2024 Fingerstick Blood Glucose Tests = 11 Average Number Tests/Day = 0.8 Overall Mean FS Glucose = 108    DIABETIC COMPLICATIONS: Microvascular complications:  Neuropathy  Denies: CKD, retinopathy  Last eye exam: scheduled 07/2022   Macrovascular complications:  CAD ( medically treated per pt)  Denies: CAD, PVD, CVA   HISTORY:  Past Medical History:  Past Medical History:  Diagnosis Date   Acute hypoxemic respiratory failure due to severe acute respiratory syndrome coronavirus 2 (SARS-CoV-2) disease (HCC) 06/18/2019   Acute pharyngitis 05/23/2009   Qualifier: Diagnosis of  By: Inocencio MD, Berwyn A    Allergic rhinitis 03/26/2007   Qualifier: Diagnosis of  By: Norleen MD, Lynwood ORN   Formatting of this note might be different from the original. Overview:  Qualifier: Diagnosis of  By: Norleen MD, Lynwood ORN   Angina pectoris (HCC) 12/04/2018   Asthma    severe   Asthma with exacerbation 03/26/2007   Formatting of this note might be different from the original. Overview:  Qualifier: Diagnosis of  By: Norleen MD, Lynwood ORN   ASTHMATIC BRONCHITIS, ACUTE 04/27/2008   Qualifier: Diagnosis of  By: Norleen MD, Lynwood ORN    Bronchitis, chronic (HCC) 04/22/2008   Qualifier: Diagnosis of  By: Claudene GUSS Heron Elyn of  this note might be different from the original. Overview:  Qualifier: Diagnosis of  By: Claudene BANANA, Heron   CAD (coronary artery disease) 01/01/2019   Carpal tunnel syndrome of left wrist 12/30/2019   Chronic obstructive pulmonary disease (HCC) 06/19/2019   Chronic pain syndrome 04/25/2017   Community acquired pneumonia 01/15/2020   Coronary artery disease    Cough with hemoptysis 05/11/2018   Depression    DEPRESSION 01/05/2007   Qualifier: Diagnosis of  By: Norleen MD, Lynwood ORN    Diabetes mellitus (HCC) 10/07/2020   Diabetes mellitus due to underlying condition with unspecified complications (HCC) 03/26/2007   Qualifier: Diagnosis of  By: Norleen MD, Lynwood ORN    Diabetes mellitus in remission (HCC) 07/03/2021   Diabetes mellitus type 2 in obese 05/12/2018   Diabetes mellitus without complication (HCC)    type 2   DSORD CIRCADIAN RHY SHFT WRK SLEEP PHASE TYPE 03/26/2007   Qualifier: Diagnosis of  By: Norleen MD, Lynwood ORN   Formatting of this note might be different from the original. Overview:  Qualifier: Diagnosis of  By: Norleen MD, Lynwood ORN   Dyslipidemia 10/19/2019   DYSPHAGIA 04/27/2008   Qualifier: Diagnosis of  By: Abran MD, Norleen SAILOR    Dyspnea    with exertion   Essential hypertension 01/05/2007   Qualifier: Diagnosis of  By: Norleen MD, Lynwood ORN    Fatigue 07/03/2021   Fatty liver    Fibromyalgia    GERD 01/05/2007   Qualifier: Diagnosis of  By: Norleen MD, Lynwood ORN    GERD (gastroesophageal reflux disease)    HAMMER TOE 01/05/2007   Qualifier: Diagnosis of  By: Norleen MD, Lynwood ORN    Headache 02/24/2009   Qualifier: Diagnosis of  By: Norleen MD, Lynwood ORN   Hemoptysis 05/11/2018   Hyperlipidemia 01/05/2007   diet controlled Formatting of this note might be different from the original. Overview:  Qualifier: Diagnosis of  By: Norleen MD, Lynwood ORN   Hypertension    Hypothyroidism    Inflammatory arthritis 07/22/2020   IRRITABLE BOWEL SYNDROME, HX OF 03/26/2007   Qualifier: Diagnosis of  By: Norleen MD, Lynwood ORN    Lumbar facet arthropathy 04/25/2017   Lumbar radiculopathy 09/19/2017   Migraine headache 04/22/2008   Formatting of this note might be different from the original. Overview:  Qualifier: Diagnosis of  By: Claudene BANANA Heron   Mixed dyslipidemia 01/05/2007   Qualifier: Diagnosis of  By: Norleen MD, Lynwood ORN    Morbid obesity (HCC) 12/04/2018   Morbid obesity with BMI of 50.0-59.9, adult (HCC) 09/23/2019   Numbness 12/04/2019   Obesity (BMI 35.0-39.9 without  comorbidity) 10/30/2021   Old disruption of left lateral collateral ligament 12/04/2019   Osteoarthritis 07/22/2020   Pain from implanted hardware 08/01/2017   Pain in left shoulder 07/13/2020   Pain in unspecified knee 03/04/2017   PEPTIC ULCER DISEASE 03/26/2007   Qualifier: Diagnosis of   By: Norleen MD, Lynwood ORN     Replacing diagnoses that were inactivated after the 09/10/22 regulatory import   PERSONAL HISTORY ENDOCRN METABOLIC&IMMUNITY D/O 04/22/2008   Qualifier: Diagnosis of  By: Claudene BANANA, Barbara     Plantar callus 08/01/2017   Plantar fasciitis 08/01/2017   Pre-operative cardiovascular examination 07/25/2020   Pre-ulcerative corn or callous 01/30/2018   Preop cardiovascular exam 01/22/2020   Primary osteoarthritis of both hips 04/25/2017   Primary osteoarthritis of both knees 04/25/2017   RESTLESS LEG SYNDROME, HX OF 03/26/2007   Qualifier: Diagnosis  of  By: Norleen MD, Lynwood ORN    S/P laparoscopic sleeve gastrectomy 10/12/2020   Segmental dysfunction of lumbar region 04/25/2017   SINUSITIS- ACUTE-NOS 02/24/2009   Qualifier: Diagnosis of  By: Norleen MD, Lynwood ORN    Sjogren's disease [facility])    Sleep apnea    CPAP nightly   Sleep apnea, obstructive 01/05/2007   Qualifier: Diagnosis of  By: Norleen MD, Lynwood ORN   Formatting of this note might be different from the original. Overview:  Qualifier: Diagnosis of  By: Norleen MD, Lynwood ORN   Thoracic spine pain 11/13/2017   Thyroid  disease    Trigger finger, left little finger 12/30/2019   Trochanteric bursitis of left hip 03/02/2018   Type 2 diabetes mellitus with diabetic polyneuropathy, with long-term current use of insulin  (HCC) 01/26/2020   Type 2 diabetes mellitus with hyperglycemia, without long-term current use of insulin  (HCC) 10/19/2019   Vitamin D  deficiency, unspecified 08/25/2018   Past Surgical History:  Past Surgical History:  Procedure Laterality Date   ANKLE SURGERY  2010   BACK SURGERY     CARPAL TUNNEL RELEASE Bilateral     CHOLECYSTECTOMY     COLONOSCOPY  04/2014   FOOT SURGERY     bunion surgery bialteral   HYSTEROSCOPY     LEFT HEART CATH AND CORONARY ANGIOGRAPHY N/A 12/19/2018   Procedure: LEFT HEART CATH AND CORONARY ANGIOGRAPHY;  Surgeon: Mady Bruckner, MD;  Location: [facility];  Service: Cardiovascular;  Laterality: N/A;   LEFT HEART CATH AND CORONARY ANGIOGRAPHY N/A 02/16/2021   Procedure: LEFT HEART CATH AND CORONARY ANGIOGRAPHY;  Surgeon: Mady Bruckner, MD;  Location: [facility] CV [facility];  Service: Cardiovascular;  Laterality: N/A;   LIGAMENT REPAIR Left 02/04/2020   Procedure: REPAIR RADIAL COLLATERAL LIGAMENT METACARPAL PHALANGEAL LEFT SMALL FINGER; POSSIBLE PINNING;  Surgeon: Murrell Kuba, MD;  Location: [facility] OR;  Service: Orthopedics;  Laterality: Left;  AXILLARY BLOCK   RESECTION DISTAL CLAVICAL Right    STERIOD INJECTION Left 02/04/2020   Procedure: INJECTION LEFT CARPAL TUNNEL;  Surgeon: Murrell Kuba, MD;  Location: [facility] OR;  Service: Orthopedics;  Laterality: Left;   TUBAL LIGATION     UPPER GI ENDOSCOPY  04/2014   WISDOM TOOTH EXTRACTION     Social History:  reports that she quit smoking about 4 years ago. Her smoking use included cigarettes. She has never used smokeless tobacco. She reports that she does not drink alcohol  and does not use drugs. Family History:  Family History  Problem Relation Age of Onset   Heart failure Father    Colon cancer Neg Hx      HOME MEDICATIONS: Allergies as of 12/02/2023       Reactions   Macrodantin [nitrofurantoin Macrocrystal] Rash   Metformin And Related Other (See Comments)   Causes yeast infections   Atorvastatin Diarrhea   Hydromorphone Hcl Nausea And Vomiting   Lipitor [atorvastatin Calcium] Diarrhea   Meperidine Hcl Nausea Only   Is ok if given with Phenergan    Tetracyclines & Related Rash        Medication List        Accurate as of December 02, 2023  1:13 PM. If you have any questions, ask your nurse or doctor.           Accu-Chek Aviva Plus w/Device Kit Check blood sugar 3 times daily   Accu-Chek FastClix Lancets Misc Check blood sugars 3 times daily   albuterol  108 (90 Base) MCG/ACT inhaler Commonly known as:  VENTOLIN  HFA   albuterol  (2.5 MG/3ML) 0.083% nebulizer solution Commonly known as: PROVENTIL  Take 2.5 mg by nebulization every 6 (six) hours as needed for wheezing or shortness of breath.   calcium carbonate 500 MG chewable tablet Commonly known as: TUMS - dosed in mg elemental calcium Chew 1 tablet by mouth 3 (three) times daily.   ciclopirox  8 % solution Commonly known as: PENLAC  Apply topically at bedtime. Apply over nail and surrounding skin. Apply daily over previous coat. After seven (7) days, may remove with alcohol  and continue cycle.   colchicine  0.6 MG tablet Take 1 tablet (0.6 mg total) by mouth 2 (two) times daily. Stop taking the medication once your symptoms improve.   cyclobenzaprine  10 MG tablet Commonly known as: FLEXERIL  Take 10 mg by mouth 2 (two) times daily as needed for muscle spasms.   docusate sodium  100 MG capsule Commonly known as: Colace Take 1 capsule (100 mg total) by mouth 2 (two) times daily.   DropSafe Alcohol  Prep 70 % Pads USE AS DIRECTED   escitalopram  20 MG tablet Commonly known as: LEXAPRO  Take 20 mg by mouth at bedtime.   fluticasone  50 MCG/ACT nasal spray Commonly known as: FLONASE  Place 1 spray into both nostrils daily as needed for allergies.   hydrochlorothiazide  25 MG tablet Commonly known as: HYDRODIURIL  Take 25 mg by mouth daily.   insulin  lispro 100 UNIT/ML KwikPen Commonly known as: HumaLOG  KwikPen Max daily 15 units   Insulin  Pen Needle 32G X 4 MM Misc 1 Device by Does not apply route 3 (three) times daily.   ketoconazole  2 % cream Commonly known as: NIZORAL  Apply 1 Application topically daily.   levothyroxine  75 MCG tablet Commonly known as: SYNTHROID  Take 1 tablet (75 mcg total) by mouth daily.   lisinopril  10  MG tablet Commonly known as: ZESTRIL  Take 10 mg by mouth daily.   LUBRICATING EYE DROPS OP Place 1 drop into both eyes daily as needed for dry eyes (dry eyes).   meloxicam  7.5 MG tablet Commonly known as: MOBIC  TAKE 1 TABLET BY MOUTH DAILY. TAKE FOR THE FIRST FEW DAYS TO SEE IF IT STOPS YOUR FOOT PAIN. IF NO IMPROVEMENT, SWITCH OVER TO THE COLCHICINE    montelukast  10 MG tablet Commonly known as: SINGULAIR  Take 10 mg by mouth daily.   nitroGLYCERIN  0.4 MG SL tablet Commonly known as: NITROSTAT  Place 1 tablet (0.4 mg total) under the tongue every 5 (five) minutes as needed for chest pain.   nystatin powder Commonly known as: MYCOSTATIN/NYSTOP Apply 1 application topically 2 (two) times daily as needed for dry skin (dry skin).   omeprazole 40 MG capsule Commonly known as: PRILOSEC Take 1 capsule by mouth daily.   pregabalin  150 MG capsule Commonly known as: LYRICA  Take 150 mg by mouth 3 (three) times daily.   Repatha  140 MG/ML Sosy Generic drug: Evolocumab  140 mg by Other route every 14 (fourteen) days.   Semaglutide  (1 MG/DOSE) 4 MG/3ML Sopn Inject 1 mg as directed once a week.   Sunosi 150 MG Tabs Generic drug: Solriamfetol HCl Take 1 tablet by mouth every morning.   terconazole 0.4 % vaginal cream Commonly known as: TERAZOL 7 Place 1 applicator vaginally at bedtime as needed for other (yeast infections). Yeast infection   topiramate  25 MG capsule Commonly known as: TOPAMAX  Take 50 mg by mouth 2 (two) times daily.   Trelegy Ellipta 200-62.5-25 MCG/ACT Aepb Generic drug: Fluticasone -Umeclidin-Vilant Inhale 1 puff into the lungs daily.   Vitamin D  (Ergocalciferol )  1.25 MG (50000 UNIT) Caps capsule Commonly known as: DRISDOL Take 50,000 Units by mouth every Saturday.   ZTlido  1.8 % Ptch Generic drug: Lidocaine  Apply 1 patch topically 3 (three) times daily.         OBJECTIVE:   Vital Signs: BP 124/76 (BP Location: Left Arm, Patient Position: Sitting,  Cuff Size: Normal)   Pulse (!) 102   Ht 5' 6 (1.676 m)   Wt 252 lb (114.3 kg)   LMP  (LMP Unknown)   SpO2 96%   BMI 40.67 kg/m   Wt Readings from Last 3 Encounters:  12/02/23 252 lb (114.3 kg)  06/03/23 259 lb 3.2 oz (117.6 kg)  01/09/23 257 lb (116.6 kg)     Exam: General: Pt appears well and is in NAD  Lungs: Clear with good BS bilat   Heart: RRR   Extremities: no  pretibial edema.   Neuro: MS is good with appropriate affect, pt is alert and Ox3   DM Foot Exam: 10/14/2023 per podiatry    DATA REVIEWED:  Lab Results  Component Value Date   HGBA1C 5.8 (A) 12/02/2023   HGBA1C 6.1 (A) 06/03/2023   HGBA1C 6.8 (A) 01/09/2023   Labs@Care  Everywhere 10/23/2023   Triglycerides 130 HDL 41 LDL 51 Glucose 114 BUN 11 Creatinine 1.11 GFR 56  Old records , labs and images have been reviewed.    ASSESSMENT / PLAN / RECOMMENDATIONS:   1) Postablative Hypothyroidism :  -Patient is clinically thyroid  -No local neck symptoms -****  Medication :  levothyroxine  75 mcg daily   2) Type 2 Diabetes Mellitus,Optimally Controlled  A1c 5.8 %  -A1c has trended down to 5.8% - S/P sleeve gastrectomy on 10/03/2020 .  - Pt states she was diagnosed with pancreatitis in 1992, after developing vomiting following EGD, we discussed GLP-1 agonist and the small risk of pancreatitis, after further discussion, patient opted to try Ozempic  as the benefit outweighs the risk -She is intolerant to metformin and SGLT-2 inhibitors. -    Medication  Increase Ozempic  1 mg weekly  3) Dyslipidemia :   -She is intolerant to atorvastatin, simvastatin, rosuvastatin, and Zetia  due to myalgias -LDL at goal -No changes   Medication Repatha  140 mg q. 14 days    F/U in 6 months     Signed electronically by: Stefano Redgie Butts, MD  [facility] Endocrinology  [facility] [address] [place] [place], [place] [address] Phone: [phone] FAX:  [phone]   CC: Lenon Homer, MD [address] [place] [address] Phone: [phone]  Fax: [phone]  Return to Endocrinology clinic as below: Future Appointments  Date Time Provider Department Center  01/13/2024  2:15 PM Lamount Ethan CROME, DPM [facility]

## 2023-12-02 NOTE — Patient Instructions (Addendum)
 Decrease  Ozempic  0.5  mg once weekly

## 2023-12-03 ENCOUNTER — Ambulatory Visit: Payer: Self-pay | Admitting: Internal Medicine

## 2023-12-03 MED ORDER — LEVOTHYROXINE SODIUM 75 MCG PO TABS: 75.0000 ug | ORAL_TABLET | Freq: Every day | ORAL | 3 refills | Status: DC

## 2024-01-13 ENCOUNTER — Encounter: Payer: Self-pay | Admitting: Podiatry

## 2024-01-13 ENCOUNTER — Ambulatory Visit (INDEPENDENT_AMBULATORY_CARE_PROVIDER_SITE_OTHER): Admitting: Podiatry

## 2024-01-13 DIAGNOSIS — E1142 Type 2 diabetes mellitus with diabetic polyneuropathy: Secondary | ICD-10-CM | POA: Diagnosis not present

## 2024-01-13 DIAGNOSIS — M79675 Pain in left toe(s): Secondary | ICD-10-CM | POA: Diagnosis not present

## 2024-01-13 DIAGNOSIS — L84 Corns and callosities: Secondary | ICD-10-CM

## 2024-01-13 DIAGNOSIS — M2041 Other hammer toe(s) (acquired), right foot: Secondary | ICD-10-CM

## 2024-01-13 DIAGNOSIS — M79674 Pain in right toe(s): Secondary | ICD-10-CM

## 2024-01-13 DIAGNOSIS — M2042 Other hammer toe(s) (acquired), left foot: Secondary | ICD-10-CM

## 2024-01-13 DIAGNOSIS — B351 Tinea unguium: Secondary | ICD-10-CM | POA: Diagnosis not present

## 2024-01-13 DIAGNOSIS — M21619 Bunion of unspecified foot: Secondary | ICD-10-CM

## 2024-01-13 NOTE — Progress Notes (Unsigned)
  Subjective:  Patient ID: Marie Chen, female    DOB: 1961-05-11,  MRN: [id number]  Chief Complaint  Patient presents with   [facility]    [facility] with callous and an interdigital corn on the right 2nd, medial side. Last A1c 6.2 in Jan, no anti coag.     63 y.o. female presents with the above complaint. History confirmed with patient. Patient presenting with pain related to dystrophic thickened elongated nails. Patient is unable to trim own nails related to nail dystrophy and/or mobility issues. Patient does does have a history of T2DM.  Patient does have painful hyperkeratotic callus interdigital 2nd toe medial aspect bilateral.  She does have bilateral bunions and hammertoes, right foot somewhat greater than left, previously had surgery on the toes with Dr. Burt.  She is requesting diabetic shoes.  Patient  Objective:  Physical Exam: warm, good capillary refill, diminished pedal hair growth, pedal skin atrophic nail exam onychomycosis of the toenails, onycholysis, and thickened dystrophic nails DP pulses palpable, PT pulses palpable, and protective sensation absent Left Foot:  Pain with palpation of nails due to elongation and dystrophic growth.  HAV deformity and hammertoe deformity with transverse plane deviation of the toe.  Hyperkeratotic callus without underlying ulceration present interdigital second toe PIPJ level Right Foot: Pain with palpation of nails due to elongation and dystrophic growth. HAV deformity and hammertoe deformity with transverse plane deviation of the toe. Hyperkeratotic callus without underlying ulceration present interdigital second toe PIPJ level.  Preulcerative callus plantar medial aspect of first MPJ.  Assessment:   1. Diabetic polyneuropathy associated with type 2 diabetes mellitus (HCC)   2. Pre-ulcerative calluses   3. Pain due to onychomycosis of toenails of both feet   4. Bunion   5. Acquired hammertoe of great toes of both feet        Plan:  Patient  was evaluated and treated and all questions answered.  # Preulcerative callus bilateral second toes at the level of proximal interphalangeal joints medial aspect, right plantar medial first MPJ All symptomatic hyperkeratoses x 2 were safely debrided with a sterile #15 blade to patient's level of comfort without incident. We discussed preventative and palliative care of these lesions including supportive and accommodative shoegear, padding, prefabricated and custom molded accommodative orthoses, use of a pumice stone and lotions/creams daily. - Toe spacers and accommodative padding dispensed today. - Recurrent bunion and hammertoe deformities contributing to the development of these preulcerative interdigital corns.  #Onychomycosis with pain  -Nails palliatively debrided as below. -Educated on self-care  Procedure: Nail Debridement Rationale: Pain Type of Debridement: manual, sharp debridement. Instrumentation: Nail nipper, rotary burr. Number of Nails: 10  Patient educated on diabetes. Discussed proper diabetic foot care and discussed risks and complications of disease. Educated patient in depth on reasons to return to the office immediately should he/she discover anything concerning or new on the feet. All questions answered. Discussed proper shoes as well.  Patient is -Prescription sent in for diabetic shoes today, referral to [facility] clinic - Patient would benefit from this due to diabetic neuropathy, hammertoe and bunion deformities causing preulcerative calluses.    Return in about 3 months (around 04/14/2024) for Diabetic Foot Care.         Ethan LITTIE Saddler, DPM [facility] / [facility]

## 2024-01-19 ENCOUNTER — Other Ambulatory Visit: Payer: Self-pay | Admitting: Podiatry

## 2024-01-19 NOTE — Telephone Encounter (Signed)
 Patient is now seeing Dr. Lamount and is no longer under my care.

## 2024-03-26 ENCOUNTER — Telehealth: Payer: Self-pay

## 2024-03-26 ENCOUNTER — Other Ambulatory Visit (HOSPITAL_COMMUNITY): Payer: Self-pay

## 2024-03-26 NOTE — Telephone Encounter (Signed)
Repatha needs PA

## 2024-03-26 NOTE — Telephone Encounter (Signed)
 Pharmacy Patient Advocate Encounter   Received notification from Pt Calls Messages that prior authorization for Repatha  140MG /ML syringes is required/requested.   Insurance verification completed.   The patient is insured through [facility].   Per test claim: PA required; PA submitted to above mentioned insurance via Latent Key/confirmation #/EOC [id number] Status is pending

## 2024-04-01 NOTE — Telephone Encounter (Signed)
 Pharmacy Patient Advocate Encounter  Received notification from [employer] that Prior Authorization for Repatha  140MG /ML syringes has been APPROVED from 03/25/2024 to 06/10/2025   PA #/Case ID/Reference #: [id number]

## 2024-04-13 ENCOUNTER — Ambulatory Visit: Admitting: Podiatry

## 2024-04-13 ENCOUNTER — Encounter: Payer: Self-pay | Admitting: Podiatry

## 2024-04-13 DIAGNOSIS — B351 Tinea unguium: Secondary | ICD-10-CM

## 2024-04-13 DIAGNOSIS — L84 Corns and callosities: Secondary | ICD-10-CM | POA: Diagnosis not present

## 2024-04-13 DIAGNOSIS — M79675 Pain in left toe(s): Secondary | ICD-10-CM

## 2024-04-13 DIAGNOSIS — E1142 Type 2 diabetes mellitus with diabetic polyneuropathy: Secondary | ICD-10-CM | POA: Diagnosis not present

## 2024-04-13 DIAGNOSIS — M2041 Other hammer toe(s) (acquired), right foot: Secondary | ICD-10-CM

## 2024-04-13 DIAGNOSIS — M79674 Pain in right toe(s): Secondary | ICD-10-CM

## 2024-04-13 NOTE — Progress Notes (Signed)
  Subjective:  Patient ID: Marie Chen, female    DOB: 1961-01-08,  MRN: [id number]  Chief Complaint  Patient presents with   [facility]    [facility] with callous A1c was 5.7 in Aug ASA    63 y.o. female presents with the above complaint. History confirmed with patient. Patient presenting with pain related to dystrophic thickened elongated nails. Patient is unable to trim own nails related to nail dystrophy and/or mobility issues. Patient does does have a history of T2DM.  Patient does have painful hyperkeratotic callus interdigital 2nd toe medial aspect bilateral and right sub 1st metatarsal head  She does have bilateral bunions and hammertoes, right foot somewhat greater than left, previously had surgery on the toes with Dr. Burt.    Objective:  Physical Exam: warm, good capillary refill, diminished pedal hair growth, pedal skin atrophic nail exam onychomycosis of the toenails, onycholysis, and thickened dystrophic nails DP pulses palpable, PT pulses palpable, and protective sensation absent Left Foot:  Pain with palpation of nails due to elongation and dystrophic growth.  HAV deformity and hammertoe deformity with transverse plane deviation of the toe.  Hyperkeratotic callus without underlying ulceration present interdigital second toe PIPJ level Right Foot: Pain with palpation of nails due to elongation and dystrophic growth. HAV deformity and hammertoe deformity with transverse plane deviation of the toe. Hyperkeratotic callus without underlying ulceration present interdigital second toe PIPJ level.  Preulcerative callus plantar aspect of first MPJ.  Assessment:   1. Diabetic polyneuropathy associated with type 2 diabetes mellitus (HCC)   2. Pre-ulcerative calluses   3. Pain due to onychomycosis of toenails of both feet   4. Acquired hammertoe of right foot        Plan:  Patient was evaluated and treated and all questions answered.  # Preulcerative callus bilateral second toes at the  level of proximal interphalangeal joints medial aspect, right plantar first MPJ All symptomatic hyperkeratoses x 3 were safely debrided with a sterile #15 blade to patient's level of comfort without incident. We discussed preventative and palliative care of these lesions including supportive and accommodative shoegear, padding, prefabricated and custom molded accommodative orthoses, use of a pumice stone and lotions/creams daily. - Toe spacers and accommodative padding dispensed today. - Recurrent bunion and hammertoe deformities contributing to the development of these preulcerative interdigital corns. -Crest pad dispensed today  #Onychomycosis with pain  -Nails palliatively debrided as below. -Educated on self-care  Procedure: Nail Debridement Rationale: Pain Type of Debridement: manual, sharp debridement. Instrumentation: Nail nipper, rotary burr. Number of Nails: 10  Patient educated on diabetes. Discussed proper diabetic foot care and discussed risks and complications of disease. Educated patient in depth on reasons to return to the office immediately should he/she discover anything concerning or new on the feet. All questions answered. Discussed proper shoes as well.       Return in about 3 months (around 07/14/2024) for Diabetic Foot Care.         Ethan LITTIE Saddler, DPM [facility] / [facility]

## 2024-04-20 ENCOUNTER — Other Ambulatory Visit: Payer: Self-pay | Admitting: Internal Medicine

## 2024-04-23 ENCOUNTER — Other Ambulatory Visit: Payer: Self-pay

## 2024-05-02 ENCOUNTER — Other Ambulatory Visit: Payer: Self-pay | Admitting: Internal Medicine

## 2024-06-10 ENCOUNTER — Other Ambulatory Visit

## 2024-06-10 ENCOUNTER — Ambulatory Visit: Admitting: Internal Medicine

## 2024-06-10 ENCOUNTER — Encounter: Payer: Self-pay | Admitting: Internal Medicine

## 2024-06-10 VITALS — BP 112/70 | Ht 66.0 in | Wt 254.0 lb

## 2024-06-10 DIAGNOSIS — E785 Hyperlipidemia, unspecified: Secondary | ICD-10-CM

## 2024-06-10 DIAGNOSIS — E890 Postprocedural hypothyroidism: Secondary | ICD-10-CM

## 2024-06-10 DIAGNOSIS — Z7985 Long-term (current) use of injectable non-insulin antidiabetic drugs: Secondary | ICD-10-CM

## 2024-06-10 DIAGNOSIS — E1159 Type 2 diabetes mellitus with other circulatory complications: Secondary | ICD-10-CM | POA: Diagnosis not present

## 2024-06-10 LAB — T4, FREE: Free T4: 0.7 ng/dL — ABNORMAL LOW (ref 0.8–1.8)

## 2024-06-10 LAB — POCT GLYCOSYLATED HEMOGLOBIN (HGB A1C): Hemoglobin A1C: 5.5 % (ref 4.0–5.6)

## 2024-06-10 LAB — TSH: TSH: 13.08 m[IU]/L — ABNORMAL HIGH (ref 0.40–4.50)

## 2024-06-10 MED ORDER — SEMAGLUTIDE (1 MG/DOSE) 4 MG/3ML ~~LOC~~ SOPN: 1.0000 mg | PEN_INJECTOR | SUBCUTANEOUS | 3 refills | Status: AC

## 2024-06-10 NOTE — Progress Notes (Signed)
 "   Name: Marie Chen  Age/ Sex: 63 y.o., female   MRN/ DOB: [id number], 1960/07/15     PCP: Lenon Homer, MD   Reason for Endocrinology Evaluation: Type 2 Diabetes Mellitus  Initial Endocrine Consultative Visit: 10/19/2019    PATIENT IDENTIFIER: Marie Chen is a 63 y.o. 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 The patient has followed with Endocrinology clinic since 10/19/2019 for consultative assistance with management of her diabetes.  DIABETIC HISTORY:  Marie Chen was diagnosed with DM in 2015. Pt endorses genital skin irritation secondary to Metformin ?Marie Chen caused genital infections. Her hemoglobin A1c has ranged from 8.8% in 03/2019 , peaking at 10.5% in 2021.  On her initial visit to  Our clinic her A1c 9.0% . We adjust MDI regimen    S/P sleeve gastrectomy 10/03/2020, her A1c was 5.4 % and we stopped insulin     Started Ozempic  12/2022 with an A1c of 6.8%, patient does have a history of pancreatitis and opted to proceed with Ozempic  despite the increased risk of pancreatitis due to the benefits outweighing the risk     THYROID  HISTORY:  In 1994 she had RAI ablation secondary to hyperthyroidism. She needed LT-4 replacement shortly after RAI ablation .    Mother with cushing syndrome     SUBJECTIVE:   During the last visit (12/02/2023): A1c 5.8 % .     Today (06/10/2024): Marie Chen is here for a follow up on diabetes management.  She checks her blood sugars occasionally.  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 She is recovering well. Following with wound care on current Abx  Epigastric pain resolved  No nausea  No constipation or diarrhea  No  palpitations No local neck swelling     HOME ENDOCRINE REGIMEN:  Ozempic  0.5 mg weekly  Levothyroxine  75 mcg daily  Repatha  140 mg Q 14 days     Statin: yes ACE-I/ARB: yes   METER DOWNLOAD SUMMARY: n/a    DIABETIC COMPLICATIONS: Microvascular complications:  Neuropathy  Denies: CKD, retinopathy  Last eye exam: scheduled 07/2022   Macrovascular complications:  CAD ( medically treated per pt)  Denies: CAD, PVD, CVA   HISTORY:  Past Medical History:  Past Medical History:  Diagnosis Date   Acute hypoxemic respiratory failure due to severe acute respiratory syndrome coronavirus 2 (SARS-CoV-2) disease (HCC) 06/18/2019   Acute pharyngitis 05/23/2009   Qualifier: Diagnosis of  By: Inocencio MD, Berwyn A    Allergic rhinitis 03/26/2007   Qualifier: Diagnosis of  By: Norleen MD, Lynwood ORN   Formatting of this note might be different from the original. Overview:  Qualifier: Diagnosis of  By: Norleen MD, Lynwood ORN   Angina pectoris 12/04/2018   Asthma    severe   Asthma with exacerbation 03/26/2007   Formatting of this note might be different from the original. Overview:  Qualifier: Diagnosis of  By: Norleen MD, Lynwood ORN   ASTHMATIC BRONCHITIS, ACUTE 04/27/2008   Qualifier: Diagnosis of  By: Norleen MD, Lynwood ORN    Bronchitis, chronic (HCC) 04/22/2008   Qualifier: Diagnosis of  By: Claudene GUSS Heron Elyn of this note might be different from the original. Overview:  Qualifier: Diagnosis of  By: Claudene BANANA, Heron   CAD (coronary artery disease) 01/01/2019   Carpal tunnel syndrome of left wrist 12/30/2019   Chronic obstructive pulmonary disease (HCC) 06/19/2019   Chronic pain syndrome 04/25/2017   Community acquired pneumonia 01/15/2020   Coronary artery disease    Cough with hemoptysis 05/11/2018   Depression    DEPRESSION 01/05/2007   Qualifier: Diagnosis of  By: Norleen MD, Lynwood ORN    Diabetes mellitus (HCC) 10/07/2020   Diabetes mellitus due to underlying condition with  unspecified complications (HCC) 03/26/2007   Qualifier: Diagnosis of  By: Norleen MD, Lynwood ORN    Diabetes mellitus in remission 07/03/2021   Diabetes mellitus type 2 in obese 05/12/2018   Diabetes mellitus without complication (HCC)    type 2   DSORD CIRCADIAN RHY SHFT WRK SLEEP PHASE TYPE 03/26/2007   Qualifier: Diagnosis of  By: Norleen MD, Lynwood ORN   Formatting of this note might be different from the original. Overview:  Qualifier: Diagnosis of  By: Norleen MD, Lynwood ORN   Dyslipidemia 10/19/2019   DYSPHAGIA 04/27/2008   Qualifier: Diagnosis of  By: Abran MD, Norleen SAILOR    Dyspnea    with exertion   Essential hypertension 01/05/2007   Qualifier: Diagnosis of  By: Norleen MD, Lynwood ORN    Fatigue 07/03/2021   Fatty liver    Fibromyalgia    GERD 01/05/2007   Qualifier: Diagnosis of  By: Norleen MD, Lynwood ORN    GERD (gastroesophageal reflux disease)    HAMMER TOE 01/05/2007   Qualifier: Diagnosis of  By: Norleen MD, Lynwood ORN    Headache 02/24/2009   Qualifier: Diagnosis of  By: Norleen MD, Lynwood ORN   Hemoptysis 05/11/2018   Hyperlipidemia 01/05/2007   diet controlled Formatting of this note might be different from the original. Overview:  Qualifier: Diagnosis of  By: Norleen MD, Lynwood ORN   Hypertension    Hypothyroidism    Inflammatory arthritis 07/22/2020   IRRITABLE BOWEL SYNDROME, HX OF 03/26/2007   Qualifier: Diagnosis of  By: Norleen MD, Lynwood ORN    Lumbar facet arthropathy 04/25/2017   Lumbar radiculopathy 09/19/2017   Migraine headache 04/22/2008   Formatting of this note might be different from the original. Overview:  Qualifier: Diagnosis of  By: Claudene BANANA Heron   Mixed dyslipidemia 01/05/2007   Qualifier: Diagnosis of  By: Norleen MD, Lynwood ORN    Morbid obesity (HCC) 12/04/2018   Morbid obesity with BMI of 50.0-59.9, adult (HCC) 09/23/2019   Numbness 12/04/2019   Obesity (BMI 35.0-39.9 without comorbidity) 10/30/2021   Old disruption of left lateral collateral ligament 12/04/2019   Osteoarthritis  07/22/2020   Pain from implanted hardware 08/01/2017   Pain in left shoulder 07/13/2020   Pain in unspecified knee 03/04/2017   PEPTIC ULCER DISEASE 03/26/2007   Qualifier: Diagnosis of   By: Norleen MD, Lynwood ORN     Replacing diagnoses that were inactivated after the 09/10/22 regulatory import   PERSONAL HISTORY ENDOCRN METABOLIC&IMMUNITY D/O 04/22/2008   Qualifier: Diagnosis of  By: Claudene BANANA, Barbara     Plantar callus 08/01/2017   Plantar fasciitis 08/01/2017   Pre-operative cardiovascular examination 07/25/2020   Pre-ulcerative corn or callous 01/30/2018   Preop cardiovascular exam 01/22/2020   Primary osteoarthritis of both hips 04/25/2017   Primary osteoarthritis of both knees 04/25/2017   RESTLESS LEG SYNDROME, HX OF 03/26/2007   Qualifier: Diagnosis of  By: Norleen MD, Lynwood ORN    S/P  laparoscopic sleeve gastrectomy 10/12/2020   Segmental dysfunction of lumbar region 04/25/2017   SINUSITIS- ACUTE-NOS 02/24/2009   Qualifier: Diagnosis of  By: Norleen MD, Lynwood ORN    Sjogren's disease    Sleep apnea    CPAP nightly   Sleep apnea, obstructive 01/05/2007   Qualifier: Diagnosis of  By: Norleen MD, Lynwood ORN   Formatting of this note might be different from the original. Overview:  Qualifier: Diagnosis of  By: Norleen MD, Lynwood ORN   Thoracic spine pain 11/13/2017   Thyroid  disease    Trigger finger, left little finger 12/30/2019   Trochanteric bursitis of left hip 03/02/2018   Type 2 diabetes mellitus with diabetic polyneuropathy, with long-term current use of insulin  (HCC) 01/26/2020   Type 2 diabetes mellitus with hyperglycemia, without long-term current use of insulin  (HCC) 10/19/2019   Vitamin D  deficiency, unspecified 08/25/2018   Past Surgical History:  Past Surgical History:  Procedure Laterality Date   ANKLE SURGERY  2010   BACK SURGERY     CARPAL TUNNEL RELEASE Bilateral    CHOLECYSTECTOMY     COLONOSCOPY  04/2014   FOOT SURGERY     bunion surgery bialteral   HYSTEROSCOPY     LEFT  HEART CATH AND CORONARY ANGIOGRAPHY N/A 12/19/2018   Procedure: LEFT HEART CATH AND CORONARY ANGIOGRAPHY;  Surgeon: Mady Bruckner, MD;  Location: [facility];  Service: Cardiovascular;  Laterality: N/A;   LEFT HEART CATH AND CORONARY ANGIOGRAPHY N/A 02/16/2021   Procedure: LEFT HEART CATH AND CORONARY ANGIOGRAPHY;  Surgeon: Mady Bruckner, MD;  Location: [facility];  Service: Cardiovascular;  Laterality: N/A;   LIGAMENT REPAIR Left 02/04/2020   Procedure: REPAIR RADIAL COLLATERAL LIGAMENT METACARPAL PHALANGEAL LEFT SMALL FINGER; POSSIBLE PINNING;  Surgeon: Murrell Kuba, MD;  Location: [facility] OR;  Service: Orthopedics;  Laterality: Left;  AXILLARY BLOCK   RESECTION DISTAL CLAVICAL Right    STERIOD INJECTION Left 02/04/2020   Procedure: INJECTION LEFT CARPAL TUNNEL;  Surgeon: Murrell Kuba, MD;  Location: [facility];  Service: Orthopedics;  Laterality: Left;   TUBAL LIGATION     UPPER GI ENDOSCOPY  04/2014   WISDOM TOOTH EXTRACTION     Social History:  reports that she quit smoking about 4 years ago. Her smoking use included cigarettes. She has never used smokeless tobacco. She reports that she does not drink alcohol  and does not use drugs. Family History:  Family History  Problem Relation Age of Onset   Heart failure Father    Colon cancer Neg Hx      HOME MEDICATIONS: Allergies as of 06/10/2024       Reactions   Macrodantin [nitrofurantoin Macrocrystal] Rash   Metformin And Related Other (See Comments)   Causes yeast infections   Atorvastatin Diarrhea   Hydromorphone Hcl Nausea And Vomiting   Lipitor [atorvastatin Calcium] Diarrhea   Meperidine Hcl Nausea Only   Is ok if given with Phenergan    Tetracyclines & Related Rash        Medication List        Accurate as of June 10, 2024 11:44 AM. If you have any questions, ask your nurse or doctor.          Accu-Chek Aviva Plus w/Device Kit Check blood sugar 3 times daily   Accu-Chek FastClix Lancets Misc Check  blood sugars 3 times daily   albuterol  108 (90 Base) MCG/ACT inhaler Commonly known as: VENTOLIN  HFA   albuterol  (2.5 MG/3ML) 0.083% nebulizer solution Commonly known as:  PROVENTIL  Take 2.5 mg by nebulization every 6 (six) hours as needed for wheezing or shortness of breath.   calcium carbonate 500 MG chewable tablet Commonly known as: TUMS - dosed in mg elemental calcium Chew 1 tablet by mouth 3 (three) times daily.   ciclopirox  8 % solution Commonly known as: PENLAC  Apply topically at bedtime. Apply over nail and surrounding skin. Apply daily over previous coat. After seven (7) days, may remove with alcohol  and continue cycle.   colchicine  0.6 MG tablet Take 1 tablet (0.6 mg total) by mouth 2 (two) times daily. Stop taking the medication once your symptoms improve.   cyclobenzaprine  10 MG tablet Commonly known as: FLEXERIL  Take 10 mg by mouth 2 (two) times daily as needed for muscle spasms.   docusate sodium  100 MG capsule Commonly known as: Colace Take 1 capsule (100 mg total) by mouth 2 (two) times daily.   DropSafe Alcohol  Prep 70 % Pads USE AS DIRECTED   escitalopram  20 MG tablet Commonly known as: LEXAPRO  Take 20 mg by mouth at bedtime.   fluticasone  50 MCG/ACT nasal spray Commonly known as: FLONASE  Place 1 spray into both nostrils daily as needed for allergies.   hydrochlorothiazide  25 MG tablet Commonly known as: HYDRODIURIL  Take 25 mg by mouth daily.   insulin  lispro 100 UNIT/ML KwikPen Commonly known as: HumaLOG  KwikPen Max daily 15 units   Insulin  Pen Needle 32G X 4 MM Misc 1 Device by Does not apply route 3 (three) times daily.   ketoconazole  2 % cream Commonly known as: NIZORAL  Apply 1 Application topically daily.   levothyroxine  75 MCG tablet Commonly known as: SYNTHROID  Take 1 tablet (75 mcg total) by mouth daily.   lisinopril  10 MG tablet Commonly known as: ZESTRIL  Take 10 mg by mouth daily.   LUBRICATING EYE DROPS OP Place 1 drop into  both eyes daily as needed for dry eyes (dry eyes).   meloxicam  7.5 MG tablet Commonly known as: MOBIC  TAKE 1 TABLET BY MOUTH DAILY. TAKE FOR THE FIRST FEW DAYS TO SEE IF IT STOPS YOUR FOOT PAIN. IF NO IMPROVEMENT, SWITCH OVER TO THE COLCHICINE    montelukast  10 MG tablet Commonly known as: SINGULAIR  Take 10 mg by mouth daily.   nitroGLYCERIN  0.4 MG SL tablet Commonly known as: NITROSTAT  Place 1 tablet (0.4 mg total) under the tongue every 5 (five) minutes as needed for chest pain.   nystatin powder Commonly known as: MYCOSTATIN/NYSTOP Apply 1 application topically 2 (two) times daily as needed for dry skin (dry skin).   omeprazole 40 MG capsule Commonly known as: PRILOSEC Take 1 capsule by mouth daily.   Ozempic  (0.25 or 0.5 MG/DOSE) 2 MG/3ML Sopn Generic drug: Semaglutide (0.25 or 0.5MG /DOS) Inject 0.5 mg into the skin once a week.   pregabalin  150 MG capsule Commonly known as: LYRICA  Take 150 mg by mouth 3 (three) times daily.   Repatha  140 MG/ML Sosy Generic drug: Evolocumab  INJECT 1 SYRINGE SUBCUTANEOUSLY  EVERY 2 WEEKS (14 DAYS)   Sunosi 150 MG Tabs Generic drug: Solriamfetol HCl Take 1 tablet by mouth every morning.   terconazole 0.4 % vaginal cream Commonly known as: TERAZOL 7 Place 1 applicator vaginally at bedtime as needed for other (yeast infections). Yeast infection   topiramate  25 MG capsule Commonly known as: TOPAMAX  Take 50 mg by mouth 2 (two) times daily.   Trelegy Ellipta 200-62.5-25 MCG/ACT Aepb Generic drug: Fluticasone -Umeclidin-Vilant Inhale 1 puff into the lungs daily.   Vitamin D  (Ergocalciferol ) 1.25 MG (50000 UNIT)  Caps capsule Commonly known as: DRISDOL Take 50,000 Units by mouth every Saturday.   ZTlido  1.8 % Ptch Generic drug: Lidocaine  Apply 1 patch topically 3 (three) times daily.         OBJECTIVE:   Vital Signs: BP 112/70   Ht 5' 6 (1.676 m)   Wt 254 lb (115.2 kg)   LMP  (LMP Unknown)   BMI 41.00 kg/m   Wt Readings  from Last 3 Encounters:  06/10/24 254 lb (115.2 kg)  12/02/23 252 lb (114.3 kg)  06/03/23 259 lb 3.2 oz (117.6 kg)     Exam: General: Pt appears well and is in NAD  Lungs: Clear with good BS bilat   Heart: RRR   Extremities: no  pretibial edema.   Neuro: MS is good with appropriate affect, pt is alert and Ox3   DM Foot Exam: 04/13/2024 per podiatry     DATA REVIEWED:  Lab Results  Component Value Date   HGBA1C 5.8 (A) 12/02/2023   HGBA1C 6.1 (A) 06/03/2023   HGBA1C 6.8 (A) 01/09/2023    Labs@[facility]  Everywhere 05/07/2024  Glucose 130 BUN 21 Creatinine 0.98 GFR 64   03/05/2024  Triglycerides 150 HDL 43 LDL 55  Old records , labs and images have been reviewed.    ASSESSMENT / PLAN / RECOMMENDATIONS:   1) Postablative Hypothyroidism :  -Patient is clinically euthyroid -No local neck symptoms - TFTs pending today    Medication : Continue levothyroxine  75 mcg daily   2) Type 2 Diabetes Mellitus,Optimally Controlled  A1c 5.5 %  -A1c remains optimal - S/P sleeve gastrectomy on 10/03/2020 .  - Pt states she was diagnosed with pancreatitis in 1992, we discussed GLP-1 agonist and the small risk of pancreatitis, after further discussion, patient opted to try Ozempic  as the benefit outweighs the risk -She is intolerant to metformin and SGLT-2 inhibitors. - We have decreased Ozempic  from 1 mg to 0.5 mg in June, 2025 due to epigastric pain.  Patient attributes this to IBS and requested an increase of Ozempic    Medication  Increase Ozempic  1 mg weekly  3) Dyslipidemia :   -She is intolerant to atorvastatin, simvastatin, rosuvastatin, and Zetia  due to myalgias - LDL at goal, no change   Medication Repatha  140 mg q. 14 days    F/U in 6 months     Signed electronically by: Stefano Redgie Butts, MD  [facility] Endocrinology  [facility] [address] [place] [place], [place] [address] Phone: [phone] FAX:  [phone]   CC: Lenon Homer, MD [address] [place] [address] Phone: [phone]  Fax: [phone]  Return to Endocrinology clinic as below: Future Appointments  Date Time Provider Department Center  07/14/2024  1:15 PM Lamount Ethan [place], [place] [facility]  07/29/2024 11:00 AM Revankar, Jennifer SAUNDERS, [place] [facility] None    "

## 2024-06-12 ENCOUNTER — Ambulatory Visit: Payer: Self-pay | Admitting: Internal Medicine

## 2024-06-12 MED ORDER — LEVOTHYROXINE SODIUM 88 MCG PO TABS: 88.0000 ug | ORAL_TABLET | Freq: Every day | ORAL | 6 refills | Status: AC

## 2024-06-12 NOTE — Telephone Encounter (Signed)
-----   Message from Donell Redgie Butts, MD sent at 06/12/2024  8:46 AM EST -----

## 2024-06-12 NOTE — Telephone Encounter (Signed)
 Can you please contact the patient and let her know that her thyroid  shows that she is not on enough levothyroxine .   I have sent in a new prescription of levothyroxine  88 mcg dose, please discontinue levothyroxine  75 mcg   Please schedule the patient for a lab appointment in 2 months to recheck on the thyroid     Thanks

## 2024-06-12 NOTE — Telephone Encounter (Signed)
 Spoke with patient.  She is aware of results and recommendations

## 2024-06-12 NOTE — Addendum Note (Signed)
 Addended by: SAM STEFANO PARAS on: 06/12/2024 08:45 AM   Modules accepted: Orders

## 2024-06-17 ENCOUNTER — Telehealth: Payer: Self-pay

## 2024-06-17 ENCOUNTER — Other Ambulatory Visit (HOSPITAL_COMMUNITY): Payer: Self-pay

## 2024-06-17 NOTE — Telephone Encounter (Signed)
 Pharmacy Patient Advocate Encounter   Received notification from Onbase CMM KEY that prior authorization for Ozempic  (1 MG/DOSE) 4MG /3ML pen-injectors is required/requested.   Insurance verification completed.   The patient is insured through [facility].   Per test claim: PA required; PA submitted to above mentioned insurance via Latent Key/confirmation #/EOC [id number] Status is pending  APPROVED 06/17/2024 to 06/17/2025 Case ID: [id number]

## 2024-07-14 ENCOUNTER — Ambulatory Visit: Admitting: Podiatry

## 2024-07-16 ENCOUNTER — Encounter: Payer: Self-pay | Admitting: Internal Medicine

## 2024-07-28 ENCOUNTER — Ambulatory Visit: Admitting: Podiatry

## 2024-07-29 ENCOUNTER — Ambulatory Visit: Admitting: Cardiology

## 2024-08-10 ENCOUNTER — Other Ambulatory Visit

## 2024-12-09 ENCOUNTER — Ambulatory Visit: Admitting: Internal Medicine
